# Patient Record
Sex: Male | Born: 1950
Health system: Southern US, Community
[De-identification: ages and names within clinical notes are randomized; demographics above are authoritative.]

## PROBLEM LIST (undated history)

## (undated) DIAGNOSIS — S22000A Wedge compression fracture of unspecified thoracic vertebra, initial encounter for closed fracture: Secondary | ICD-10-CM

## (undated) DIAGNOSIS — J449 Chronic obstructive pulmonary disease, unspecified: Secondary | ICD-10-CM

## (undated) DIAGNOSIS — B1920 Unspecified viral hepatitis C without hepatic coma: Secondary | ICD-10-CM

## (undated) DIAGNOSIS — C3490 Malignant neoplasm of unspecified part of unspecified bronchus or lung: Secondary | ICD-10-CM

## (undated) HISTORY — PX: LUNG SURGERY: SHX703

## (undated) HISTORY — PX: KNEE SURGERY: SHX244

## (undated) HISTORY — PX: APPENDECTOMY: SHX54

## (undated) HISTORY — PX: ABDOMINAL SURGERY: SHX537

---

## 2004-01-30 ENCOUNTER — Emergency Department (HOSPITAL_COMMUNITY): Admission: EM | Admit: 2004-01-30 | Discharge: 2004-01-30 | Payer: Self-pay | Admitting: Emergency Medicine

## 2005-10-06 ENCOUNTER — Inpatient Hospital Stay (HOSPITAL_COMMUNITY): Admission: EM | Admit: 2005-10-06 | Discharge: 2005-10-09 | Payer: Self-pay | Admitting: Emergency Medicine

## 2014-08-07 ENCOUNTER — Encounter (HOSPITAL_COMMUNITY): Payer: Self-pay | Admitting: Emergency Medicine

## 2014-08-07 ENCOUNTER — Inpatient Hospital Stay (HOSPITAL_COMMUNITY)
Admission: AD | Admit: 2014-08-07 | Discharge: 2014-08-09 | DRG: 881 | Disposition: A | Discharge status: Other Acute Inpatient Hospital | Payer: Non-veteran care | Source: Intra-hospital | Attending: Internal Medicine | Admitting: Internal Medicine

## 2014-08-07 ENCOUNTER — Encounter (HOSPITAL_COMMUNITY): Payer: Self-pay | Admitting: *Deleted

## 2014-08-07 ENCOUNTER — Emergency Department (EMERGENCY_DEPARTMENT_HOSPITAL)
Admission: EM | Admit: 2014-08-07 | Discharge: 2014-08-07 | Disposition: A | Discharge status: Other Acute Inpatient Hospital | Payer: Non-veteran care | Source: Home / Self Care | Attending: Emergency Medicine | Admitting: Emergency Medicine

## 2014-08-07 DIAGNOSIS — Z5900 Homelessness unspecified: Secondary | ICD-10-CM

## 2014-08-07 DIAGNOSIS — Z79899 Other long term (current) drug therapy: Secondary | ICD-10-CM | POA: Diagnosis not present

## 2014-08-07 DIAGNOSIS — J449 Chronic obstructive pulmonary disease, unspecified: Secondary | ICD-10-CM | POA: Diagnosis present

## 2014-08-07 DIAGNOSIS — F1124 Opioid dependence with opioid-induced mood disorder: Secondary | ICD-10-CM

## 2014-08-07 DIAGNOSIS — R109 Unspecified abdominal pain: Secondary | ICD-10-CM

## 2014-08-07 DIAGNOSIS — B1920 Unspecified viral hepatitis C without hepatic coma: Secondary | ICD-10-CM | POA: Diagnosis present

## 2014-08-07 DIAGNOSIS — R030 Elevated blood-pressure reading, without diagnosis of hypertension: Secondary | ICD-10-CM

## 2014-08-07 DIAGNOSIS — K760 Fatty (change of) liver, not elsewhere classified: Secondary | ICD-10-CM | POA: Diagnosis present

## 2014-08-07 DIAGNOSIS — Z720 Tobacco use: Secondary | ICD-10-CM | POA: Diagnosis present

## 2014-08-07 DIAGNOSIS — F1410 Cocaine abuse, uncomplicated: Secondary | ICD-10-CM | POA: Insufficient documentation

## 2014-08-07 DIAGNOSIS — F1994 Other psychoactive substance use, unspecified with psychoactive substance-induced mood disorder: Secondary | ICD-10-CM | POA: Diagnosis present

## 2014-08-07 DIAGNOSIS — IMO0001 Reserved for inherently not codable concepts without codable children: Secondary | ICD-10-CM | POA: Diagnosis present

## 2014-08-07 DIAGNOSIS — F32A Depression, unspecified: Secondary | ICD-10-CM | POA: Diagnosis present

## 2014-08-07 DIAGNOSIS — Z8709 Personal history of other diseases of the respiratory system: Secondary | ICD-10-CM

## 2014-08-07 DIAGNOSIS — F39 Unspecified mood [affective] disorder: Secondary | ICD-10-CM | POA: Diagnosis not present

## 2014-08-07 DIAGNOSIS — F329 Major depressive disorder, single episode, unspecified: Secondary | ICD-10-CM | POA: Insufficient documentation

## 2014-08-07 DIAGNOSIS — K5900 Constipation, unspecified: Secondary | ICD-10-CM | POA: Diagnosis present

## 2014-08-07 DIAGNOSIS — Z85118 Personal history of other malignant neoplasm of bronchus and lung: Secondary | ICD-10-CM

## 2014-08-07 DIAGNOSIS — Z609 Problem related to social environment, unspecified: Secondary | ICD-10-CM | POA: Diagnosis present

## 2014-08-07 DIAGNOSIS — Z853 Personal history of malignant neoplasm of breast: Secondary | ICD-10-CM

## 2014-08-07 DIAGNOSIS — Z23 Encounter for immunization: Secondary | ICD-10-CM

## 2014-08-07 DIAGNOSIS — R112 Nausea with vomiting, unspecified: Secondary | ICD-10-CM | POA: Diagnosis present

## 2014-08-07 DIAGNOSIS — G4700 Insomnia, unspecified: Secondary | ICD-10-CM | POA: Diagnosis present

## 2014-08-07 DIAGNOSIS — T402X5A Adverse effect of other opioids, initial encounter: Secondary | ICD-10-CM | POA: Diagnosis present

## 2014-08-07 DIAGNOSIS — R45851 Suicidal ideations: Secondary | ICD-10-CM

## 2014-08-07 DIAGNOSIS — Z8619 Personal history of other infectious and parasitic diseases: Secondary | ICD-10-CM

## 2014-08-07 DIAGNOSIS — K5903 Drug induced constipation: Secondary | ICD-10-CM | POA: Diagnosis present

## 2014-08-07 DIAGNOSIS — F1123 Opioid dependence with withdrawal: Secondary | ICD-10-CM | POA: Diagnosis present

## 2014-08-07 DIAGNOSIS — F10239 Alcohol dependence with withdrawal, unspecified: Secondary | ICD-10-CM | POA: Diagnosis not present

## 2014-08-07 DIAGNOSIS — F17210 Nicotine dependence, cigarettes, uncomplicated: Secondary | ICD-10-CM | POA: Diagnosis present

## 2014-08-07 DIAGNOSIS — F1021 Alcohol dependence, in remission: Secondary | ICD-10-CM | POA: Diagnosis present

## 2014-08-07 DIAGNOSIS — F1110 Opioid abuse, uncomplicated: Secondary | ICD-10-CM | POA: Diagnosis present

## 2014-08-07 DIAGNOSIS — D72829 Elevated white blood cell count, unspecified: Secondary | ICD-10-CM | POA: Diagnosis present

## 2014-08-07 DIAGNOSIS — F1193 Opioid use, unspecified with withdrawal: Secondary | ICD-10-CM | POA: Diagnosis present

## 2014-08-07 HISTORY — DX: Malignant neoplasm of unspecified part of unspecified bronchus or lung: C34.90

## 2014-08-07 HISTORY — DX: Chronic obstructive pulmonary disease, unspecified: J44.9

## 2014-08-07 HISTORY — DX: Unspecified viral hepatitis C without hepatic coma: B19.20

## 2014-08-07 LAB — COMPREHENSIVE METABOLIC PANEL
ALBUMIN: 3.6 g/dL (ref 3.5–5.2)
ALT: 20 U/L (ref 0–53)
ANION GAP: 9 (ref 5–15)
AST: 19 U/L (ref 0–37)
Alkaline Phosphatase: 84 U/L (ref 39–117)
BILIRUBIN TOTAL: 0.5 mg/dL (ref 0.3–1.2)
BUN: 9 mg/dL (ref 6–23)
CO2: 25 mEq/L (ref 19–32)
CREATININE: 0.72 mg/dL (ref 0.50–1.35)
Calcium: 8.8 mg/dL (ref 8.4–10.5)
Chloride: 101 mEq/L (ref 96–112)
GFR calc non Af Amer: >90 mL/min (ref >90)
GLUCOSE: 104 mg/dL — AB (ref 70–99)
Potassium: 4.2 mEq/L (ref 3.7–5.3)
Sodium: 135 mEq/L — ABNORMAL LOW (ref 137–147)
TOTAL PROTEIN: 6.6 g/dL (ref 6.0–8.3)

## 2014-08-07 LAB — CBC
HEMATOCRIT: 40.2 % (ref 39.0–52.0)
HEMOGLOBIN: 13.3 g/dL (ref 13.0–17.0)
MCH: 29.4 pg (ref 26.0–34.0)
MCHC: 33.1 g/dL (ref 30.0–36.0)
MCV: 88.9 fL (ref 78.0–100.0)
Platelets: 208 10*3/uL (ref 150–400)
RBC: 4.52 MIL/uL (ref 4.22–5.81)
RDW: 12.9 % (ref 11.5–15.5)
WBC: 6.2 10*3/uL (ref 4.0–10.5)

## 2014-08-07 LAB — RAPID URINE DRUG SCREEN, HOSP PERFORMED
Amphetamines: NOT DETECTED
BARBITURATES: NOT DETECTED
Benzodiazepines: NOT DETECTED
COCAINE: POSITIVE — AB
OPIATES: POSITIVE — AB
TETRAHYDROCANNABINOL: NOT DETECTED

## 2014-08-07 LAB — ACETAMINOPHEN LEVEL: Acetaminophen (Tylenol), Serum: <15 ug/mL (ref 10–30)

## 2014-08-07 LAB — SALICYLATE LEVEL: Salicylate Lvl: <2 mg/dL — ABNORMAL LOW (ref 2.8–20.0)

## 2014-08-07 LAB — ETHANOL

## 2014-08-07 MED ORDER — CLONIDINE HCL 0.1 MG PO TABS: 0.1000 mg | ORAL_TABLET | ORAL | Status: DC

## 2014-08-07 MED ORDER — PNEUMOCOCCAL VAC POLYVALENT 25 MCG/0.5ML IJ INJ: 0.5000 mL | INJECTION | INTRAMUSCULAR | Status: DC

## 2014-08-07 MED ORDER — INFLUENZA VAC SPLIT QUAD 0.5 ML IM SUSY
0.5000 mL | PREFILLED_SYRINGE | INTRAMUSCULAR | Status: DC
  Filled 2014-08-07: qty 0.5

## 2014-08-07 MED ORDER — HYDROXYZINE HCL 25 MG PO TABS: 25.0000 mg | ORAL_TABLET | Freq: Four times a day (QID) | ORAL | Status: DC | PRN

## 2014-08-07 MED ORDER — CLONIDINE HCL 0.1 MG PO TABS
0.1000 mg | ORAL_TABLET | Freq: Four times a day (QID) | ORAL | Status: AC
  Administered 2014-08-07 – 2014-08-08 (×5): 0.1 mg via ORAL
  Filled 2014-08-07 (×13): qty 1

## 2014-08-07 MED ORDER — NAPROXEN 500 MG PO TABS: 500.0000 mg | ORAL_TABLET | Freq: Two times a day (BID) | ORAL | Status: DC | PRN

## 2014-08-07 MED ORDER — ACETAMINOPHEN 325 MG PO TABS: 650.0000 mg | ORAL_TABLET | ORAL | Status: DC | PRN

## 2014-08-07 MED ORDER — CLONIDINE HCL 0.1 MG PO TABS
0.1000 mg | ORAL_TABLET | Freq: Four times a day (QID) | ORAL | Status: DC
  Administered 2014-08-07: 0.1 mg via ORAL
  Filled 2014-08-07: qty 1

## 2014-08-07 MED ORDER — CLONIDINE HCL 0.1 MG PO TABS: 0.1000 mg | ORAL_TABLET | Freq: Every day | ORAL | Status: DC

## 2014-08-07 MED ORDER — LOPERAMIDE HCL 2 MG PO CAPS: 2.0000 mg | ORAL_CAPSULE | ORAL | Status: DC | PRN

## 2014-08-07 MED ORDER — METHOCARBAMOL 500 MG PO TABS: 500.0000 mg | ORAL_TABLET | Freq: Three times a day (TID) | ORAL | Status: DC | PRN

## 2014-08-07 MED ORDER — ACETAMINOPHEN 325 MG PO TABS: 650.0000 mg | ORAL_TABLET | Freq: Four times a day (QID) | ORAL | Status: DC | PRN

## 2014-08-07 MED ORDER — HYDROXYZINE HCL 25 MG PO TABS
25.0000 mg | ORAL_TABLET | Freq: Four times a day (QID) | ORAL | Status: DC | PRN
  Filled 2014-08-07: qty 1

## 2014-08-07 MED ORDER — ONDANSETRON 4 MG PO TBDP: 4.0000 mg | ORAL_TABLET | Freq: Four times a day (QID) | ORAL | Status: DC | PRN

## 2014-08-07 MED ORDER — CLONIDINE HCL 0.1 MG PO TABS
0.1000 mg | ORAL_TABLET | ORAL | Status: DC
Start: 2014-08-09 — End: 2014-08-09
  Filled 2014-08-07 (×4): qty 1

## 2014-08-07 MED ORDER — DICYCLOMINE HCL 20 MG PO TABS: 20.0000 mg | ORAL_TABLET | Freq: Four times a day (QID) | ORAL | Status: DC | PRN

## 2014-08-07 MED ORDER — ONDANSETRON 4 MG PO TBDP
4.0000 mg | ORAL_TABLET | Freq: Four times a day (QID) | ORAL | Status: DC | PRN
  Administered 2014-08-08 (×2): 4 mg via ORAL
  Filled 2014-08-07 (×4): qty 1

## 2014-08-07 MED ORDER — ALUM & MAG HYDROXIDE-SIMETH 200-200-20 MG/5ML PO SUSP: 30.0000 mL | ORAL | Status: DC | PRN

## 2014-08-07 MED ORDER — MAGNESIUM HYDROXIDE 400 MG/5ML PO SUSP: 30.0000 mL | Freq: Every day | ORAL | Status: DC | PRN

## 2014-08-07 MED ORDER — DICYCLOMINE HCL 20 MG PO TABS
20.0000 mg | ORAL_TABLET | Freq: Four times a day (QID) | ORAL | Status: DC | PRN
  Filled 2014-08-07: qty 1

## 2014-08-07 NOTE — Progress Notes (Signed)
ADMISSION NOTE D: Patient presents to [facility] with SI with plan to "walk in front of a car." Pt reports current heroin use. Pt denies HI and AVH. Pt complains of chills, anxiety, and headache pain 4/10 at this time. Pts mood is depressed and affect is flat. Pt states his current stressors are homelessness, upcoming court date, and financial struggles. Pt reports his daughter is a good support person in his life. Pt is observed to be drowsy during admission.  A: Pt offered fluids. Pt oriented to unit and staff. Support provided to pt. Pt encouraged to rest/relax. 15 minute checks initiated per protocol for patient safety. R: Pt cooperative and receptive to admission and to nursing interventions. Pt verbally contracts for safety with RN.

## 2014-08-07 NOTE — ED Provider Notes (Signed)
CSN: [id number]     Arrival date & time 08/07/14  1046 History   First MD Initiated Contact with Patient 08/07/14 1122     Chief Complaint  Patient presents with  . detox    . SI      (Consider location/radiation/quality/duration/timing/severity/associated sxs/prior Treatment) HPI Comments: 63 year old male with history of alcohol abuse, heroin abuse, smoker, homeless presents interested in detox and evaluation for suicidal ideation. Patient has been using heroin for some time now last use last night. Patient has had gradually worsening depression in the past week he has had thoughts of walking into traffic to end his life. He denies admission for heroin detox, patient has been alcohol detox in the past but does not currently is on call. Patient has a history of hep C COPD and lung cancer. Patient has no family or support in the area. Nothing improves his symptoms  The history is provided by the patient.    Past Medical History  Diagnosis Date  . Lung cancer     resolved in 2011  . Hepatitis C   . COPD (chronic obstructive pulmonary disease)    Past Surgical History  Procedure Laterality Date  . Abdominal surgery      2008  . Lung surgery      2011  . Appendectomy    . Knee surgery      right knee, 1967   History reviewed. No pertinent family history. History  Substance Use Topics  . Smoking status: Current Every Day Smoker -- 1.00 packs/day for 15 years    Types: Cigarettes  . Smokeless tobacco: Not on file  . Alcohol Use: No    Review of Systems  Constitutional: Negative for fever and chills.  HENT: Negative for congestion.   Eyes: Negative for visual disturbance.  Respiratory: Negative for shortness of breath.   Cardiovascular: Negative for chest pain.  Gastrointestinal: Negative for vomiting and abdominal pain.  Genitourinary: Negative for dysuria and flank pain.  Musculoskeletal: Negative for back pain, neck pain and neck stiffness.  Skin: Negative for rash.   Neurological: Negative for light-headedness and headaches.  Psychiatric/Behavioral: Positive for suicidal ideas and dysphoric mood.      Allergies  Review of patient's allergies indicates no known allergies.  Home Medications   Prior to Admission medications   Not on File   BP 112/73  Pulse 76  Temp(Src) 98.6 F (37 C) (Oral)  Resp 16  SpO2 99% Physical Exam  Nursing note and vitals reviewed. Constitutional: He is oriented to person, place, and time. He appears well-developed and well-nourished.  HENT:  Head: Normocephalic and atraumatic.  Eyes: Conjunctivae are normal. Right eye exhibits no discharge. Left eye exhibits no discharge.  Neck: Normal range of motion. Neck supple. No tracheal deviation present.  Cardiovascular: Normal rate and regular rhythm.   Pulmonary/Chest: Effort normal and breath sounds normal.  Abdominal: Soft. He exhibits no distension. There is no tenderness. There is no guarding.  Musculoskeletal: He exhibits no edema.  Neurological: He is alert and oriented to person, place, and time.  Skin: Skin is warm. No rash noted.  Psychiatric: His mood appears not anxious. His speech is not rapid and/or pressured. He expresses suicidal ideation. He expresses no homicidal plans.  Mild flat affect.    ED Course  Procedures (including critical care time) Labs Review Labs Reviewed  COMPREHENSIVE METABOLIC PANEL - Abnormal; Notable for the following:    Sodium 135 (*)    Glucose, Bld 104 (*)  All other components within normal limits  SALICYLATE LEVEL - Abnormal; Notable for the following:    Salicylate Lvl <2.9 (*)    All other components within normal limits  URINE RAPID DRUG SCREEN (HOSP PERFORMED) - Abnormal; Notable for the following:    Opiates POSITIVE (*)    Cocaine POSITIVE (*)    All other components within normal limits  ACETAMINOPHEN LEVEL  CBC  ETHANOL    Imaging Review No results found.   EKG Interpretation None      MDM    Final diagnoses:  Heroin abuse  Suicidal ideation  Homeless   Patient with worsening depression, heroin abuse and recent suicidal ideation. Based on my exam is medically clear, blood work pending. Plan for behavior health assessment for further recommendations for treatment options.  Patient medically clear my exam, psychiatric orders and consult pending.  Filed Vitals:   08/07/14 1051 08/07/14 1430 08/07/14 1431  BP: 149/73 112/73 112/73  Pulse: 63 76   Temp: 98 F (36.7 C) 98.6 F (37 C)   TempSrc: Oral    Resp: 20 16   SpO2: 100% 99%       Mariea Clonts, MD 08/07/14 [phone]

## 2014-08-07 NOTE — Tx Team (Signed)
Initial Interdisciplinary Treatment Plan   PATIENT STRESSORS: Financial difficulties Legal issue Substance abuse   PROBLEM LIST: Problem List/Patient Goals Date to be addressed Date deferred Reason deferred Estimated date of resolution  Suicidal ideation 08/07/14     Substance abuse 08/07/14                                                DISCHARGE CRITERIA:  Ability to meet basic life and health needs Adequate post-discharge living arrangements Improved stabilization in mood, thinking, and/or behavior Reduction of life-threatening or endangering symptoms to within safe limits Withdrawal symptoms are absent or subacute and managed without 24-hour nursing intervention  PRELIMINARY DISCHARGE PLAN: Attend aftercare/continuing care group Attend 12-step recovery group Placement in alternative living arrangements  PATIENT/FAMIILY INVOLVEMENT: This treatment plan has been presented to and reviewed with the patient, Antwuan Eckley.  The patient and family have been given the opportunity to ask questions and make suggestions.  Charlyne Quale A 08/07/2014, 4:18 PM

## 2014-08-07 NOTE — Progress Notes (Signed)
Patient ID: Gregory Price, male   DOB: June 26, 1951, 63 y.o.   MRN: [id number] Patient has been asleep since his admission.  He did take his scheduled clonidine.

## 2014-08-07 NOTE — ED Notes (Signed)
[facility] called and arrived to transport pt.

## 2014-08-07 NOTE — ED Notes (Addendum)
Pt reports he is homeless wants heroine detox.last use last night, pt reports he shoots up. Reports he quit drinking 12 years ago. Has used heroine for past 5-6 years. Reports he is depressed, never had SI attempt in past. Denies HI, AH/VH. Denies pain. Hx Hep C, COPD, and lung cancer.   Pt later stated to rn and MD that he was SI and wanted to run into traffic. This stated in other documentation.

## 2014-08-07 NOTE — Consult Note (Signed)
[facility] Face-to-Face Psychiatry Consult   Reason for Consult:  Suicidal ideation Referring Physician:  EDP  Benecio Kluger is an 63 y.o. male. Total Time spent with patient: 45 minutes  Assessment: AXIS I:  Depressive Disorder NOS, Substance Abuse and Substance Induced Mood Disorder AXIS II:  Deferred AXIS III:   Past Medical History  Diagnosis Date  . Lung cancer     resolved in 2011  . Hepatitis C   . COPD (chronic obstructive pulmonary disease)    AXIS IV:  other psychosocial or environmental problems and problems related to social environment AXIS V:  41-50 serious symptoms  Plan:  Patient does not meet criteria for psychiatric inpatient admission.  Subjective:    HPI:  Jairon Ripberger is a 63 y.o. male patient present to [facility] ED with complaints of suicidal ideation and plan to jump into traffic.  Patient is also requesting heroin detox.  Patient states that he uses 1 gm of heroin a day.  "I started out with pain medication and when couldn't afford it of get it any more I started the heroin about 5 years ago.  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  Patient states that he has been in detox once 2 years ago and was clean for 2 months.  Patient is homeless.  Patient states that he needs to get his life together and get off of the heroin.   HPI Elements:   Location:  Depression. Quality:  Heroin abuse. Severity:  Suicidal ideation. Timing:  1 day. Review of Systems  Psychiatric/Behavioral: Positive for depression, suicidal ideas and substance abuse. Negative for hallucinations and memory loss. The patient is not nervous/anxious and does not have insomnia.   All other systems reviewed and are negative.  History reviewed. No pertinent family history.  Past Psychiatric History: Past Medical History  Diagnosis Date  . Lung cancer     resolved in 2011  . Hepatitis C   . COPD (chronic obstructive pulmonary disease)     reports that he has been smoking Cigarettes.  He has a 15 pack-year  smoking history. He does not have any smokeless tobacco history on file. He reports that he uses illicit drugs. He reports that he does not drink alcohol. History reviewed. No pertinent family history.         Allergies:  No Known Allergies  ACT Assessment Complete:  Yes:    Educational Status    Risk to Self: Risk to self with the past 6 months Is patient at risk for suicide?: Yes Substance abuse history and/or treatment for substance abuse?: Yes (see triage note. pt reports he is depressed,SI, plant to walk into traffic. homeless, requesting heroine detox, last use last night. denies ETOH use. denies HI, AH/VH)  Risk to Others:    Abuse:    Prior Inpatient Therapy:    Prior Outpatient Therapy:    Additional Information:                    Objective: Blood pressure 149/73, pulse 63, temperature 98 F (36.7 C), temperature source Oral, resp. rate 20, SpO2 100.00%.There is no height or weight on file to calculate BMI. Results for orders placed during the hospital encounter of 08/07/14 (from the past 72 hour(s))  ACETAMINOPHEN LEVEL     Status: None   Collection Time    08/07/14 11:17 AM      Result Value Ref Range   Acetaminophen (Tylenol), Serum <15.0  10 - 30 ug/mL  Comment:            THERAPEUTIC CONCENTRATIONS VARY     SIGNIFICANTLY. A RANGE OF 10-30     ug/mL MAY BE AN EFFECTIVE     CONCENTRATION FOR MANY PATIENTS.     HOWEVER, SOME ARE BEST TREATED     AT CONCENTRATIONS OUTSIDE THIS     RANGE.     ACETAMINOPHEN CONCENTRATIONS     >150 ug/mL AT 4 HOURS AFTER     INGESTION AND >50 ug/mL AT 12     HOURS AFTER INGESTION ARE     OFTEN ASSOCIATED WITH TOXIC     REACTIONS.  CBC     Status: None   Collection Time    08/07/14 11:17 AM      Result Value Ref Range   WBC 6.2  4.0 - 10.5 K/uL   RBC 4.52  4.22 - 5.81 MIL/uL   Hemoglobin 13.3  13.0 - 17.0 g/dL   HCT 40.2  39.0 - 52.0 %   MCV 88.9  78.0 - 100.0 fL   MCH 29.4  26.0 - 34.0 pg   MCHC 33.1  30.0 -  36.0 g/dL   RDW 12.9  11.5 - 15.5 %   Platelets 208  150 - 400 K/uL  COMPREHENSIVE METABOLIC PANEL     Status: Abnormal   Collection Time    08/07/14 11:17 AM      Result Value Ref Range   Sodium 135 (*) 137 - 147 mEq/L   Potassium 4.2  3.7 - 5.3 mEq/L   Chloride 101  96 - 112 mEq/L   CO2 25  19 - 32 mEq/L   Glucose, Bld 104 (*) 70 - 99 mg/dL   BUN 9  6 - 23 mg/dL   Creatinine, Ser 0.72  0.50 - 1.35 mg/dL   Calcium 8.8  8.4 - 10.5 mg/dL   Total Protein 6.6  6.0 - 8.3 g/dL   Albumin 3.6  3.5 - 5.2 g/dL   AST 19  0 - 37 U/L   ALT 20  0 - 53 U/L   Alkaline Phosphatase 84  39 - 117 U/L   Total Bilirubin 0.5  0.3 - 1.2 mg/dL   GFR calc non Af Amer >90  >90 mL/min   GFR calc Af Amer >90  >90 mL/min   Comment: (NOTE)     The eGFR has been calculated using the CKD EPI equation.     This calculation has not been validated in all clinical situations.     eGFR's persistently <90 mL/min signify possible Chronic Kidney     Disease.   Anion gap 9  5 - 15  ETHANOL     Status: None   Collection Time    08/07/14 11:17 AM      Result Value Ref Range   Alcohol, Ethyl (B) <11  0 - 11 mg/dL   Comment:            LOWEST DETECTABLE LIMIT FOR     SERUM ALCOHOL IS 11 mg/dL     FOR MEDICAL PURPOSES ONLY  SALICYLATE LEVEL     Status: Abnormal   Collection Time    08/07/14 11:17 AM      Result Value Ref Range   Salicylate Lvl <9.5 (*) 2.8 - 20.0 mg/dL  URINE RAPID DRUG SCREEN (HOSP PERFORMED)     Status: Abnormal   Collection Time    08/07/14 11:30 AM      Result Value Ref  Range   Opiates POSITIVE (*) NONE DETECTED   Cocaine POSITIVE (*) NONE DETECTED   Benzodiazepines NONE DETECTED  NONE DETECTED   Amphetamines NONE DETECTED  NONE DETECTED   Tetrahydrocannabinol NONE DETECTED  NONE DETECTED   Barbiturates NONE DETECTED  NONE DETECTED   Comment:            DRUG SCREEN FOR MEDICAL PURPOSES     ONLY.  IF CONFIRMATION IS NEEDED     FOR ANY PURPOSE, NOTIFY LAB     WITHIN 5 DAYS.                 LOWEST DETECTABLE LIMITS     FOR URINE DRUG SCREEN     Drug Class       Cutoff (ng/mL)     Amphetamine      1000     Barbiturate      200     Benzodiazepine   016     Tricyclics       010     Opiates          300     Cocaine          300     THC              50   Labs are reviewed see abnormal values above.  Medications reviewed and no changes made.  Will start Clonidine Protocol for opiate withdrawal  Current Facility-Administered Medications  Medication Dose Route Frequency Provider Last Rate Last Dose  . acetaminophen (TYLENOL) tablet 650 mg  650 mg Oral Q4H PRN Mariea Clonts, MD      . dicyclomine (BENTYL) tablet 20 mg  20 mg Oral Q6H PRN Mariea Clonts, MD      . hydrOXYzine (ATARAX/VISTARIL) tablet 25 mg  25 mg Oral Q6H PRN Mariea Clonts, MD      . loperamide (IMODIUM) capsule 2-4 mg  2-4 mg Oral PRN Mariea Clonts, MD      . methocarbamol (ROBAXIN) tablet 500 mg  500 mg Oral Q8H PRN Mariea Clonts, MD      . naproxen (NAPROSYN) tablet 500 mg  500 mg Oral BID PRN Mariea Clonts, MD      . ondansetron (ZOFRAN-ODT) disintegrating tablet 4 mg  4 mg Oral Q6H PRN Mariea Clonts, MD       No current outpatient prescriptions on file.    Psychiatric Specialty Exam:     Blood pressure 149/73, pulse 63, temperature 98 F (36.7 C), temperature source Oral, resp. rate 20, SpO2 100.00%.There is no height or weight on file to calculate BMI.  General Appearance: Casual  Eye Contact::  Good  Speech:  Clear and Coherent and Normal Rate  Volume:  Normal  Mood:  Depressed  Affect:  Congruent and Depressed  Thought Process:  Circumstantial and Goal Directed  Orientation:  Full (Time, Place, and Person)  Thought Content:  "I want to get my life together"  Suicidal Thoughts:  Yes.  with intent/plan  Homicidal Thoughts:  No  Memory:  Immediate;   Good Recent;   Good Remote;   Fair  Judgement:  Fair  Insight:  Lacking  Psychomotor Activity:  Normal  Concentration:  Fair   Recall:  Good  Fund of Knowledge:Good  Language: Good  Akathisia:  No  Handed:  Right  AIMS (if indicated):     Assets:  Communication Skills Desire for Improvement  Sleep:  Musculoskeletal: Strength & Muscle Tone: within normal limits Gait & Station: normal Patient leans: N/A  Treatment Plan Summary: Daily contact with patient to assess and evaluate symptoms and progress in treatment Medication management Inpatient treatment recommended  Yoshimi Sarr, FNP-BC 08/07/2014 1:51 PM

## 2014-08-07 NOTE — BH Assessment (Signed)
Patient accepted to [facility] by Dr. Lovena Le and Earleen Newport, NP. The room assignment is 508-2. Nursing report # is [phone]. Support paperwork completed.

## 2014-08-07 NOTE — Consult Note (Signed)
Face to face evaluation and I agree with this note 

## 2014-08-07 NOTE — Progress Notes (Signed)
Patient ID: Gregory Price, male   DOB: [birth year], 63 y.o.   MRN: [id number] D: Client in bed, reports "rough", when asked about detox. A: Writer introduced self to client, provided emotional support, encouraged Gatorade, reviewed medication, administered as ordered. Staff will monitor q20min for safety. R: Client is safe on the unit, did not attend group.

## 2014-08-07 NOTE — Progress Notes (Signed)
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 last use last night, pt reports he shoots up. Reports     Subjective/Objective Assessment Detail:   he shoots up. Reports he quit drinking 12 years ago. Has used heroine for past 5-6 years. Reports he is depressed, never had SI attempt in past. Denies HI, AH/VH. Denies pain. Hx Hep C, COPD, and lung cancer.        [place] veteran[facility] administration is his pcp seen by an infectious disease & oncologist there also per pt     Action/Plan:   pt without pcp CM spoke with him Updated [facility]IC with [facility]. Cm discussed [facility] ED medical clearance and BH evaluation processes Cm left pt being wonded by ED security, preparing for transfer to [facility] ED   Action/Plan Detail:   Anticipated DC Date:  08/07/2014     Status Recommendation to Physician:   Result of Recommendation:    Other ED [place]  Other  Outpatient Services - Pt will follow up  PCP issues    Choice offered to / List presented to:            Status of service:  Completed, signed off  ED Comments:   ED Comments Detail:

## 2014-08-07 NOTE — Progress Notes (Signed)
Pt did not attend group. Pt was lying in bed asleep.

## 2014-08-07 NOTE — ED Notes (Signed)
Bed: WA04 Expected date:  Expected time:  Means of arrival:  Comments: No bed no monitor

## 2014-08-08 DIAGNOSIS — F1021 Alcohol dependence, in remission: Secondary | ICD-10-CM

## 2014-08-08 DIAGNOSIS — F329 Major depressive disorder, single episode, unspecified: Principal | ICD-10-CM

## 2014-08-08 DIAGNOSIS — F1194 Opioid use, unspecified with opioid-induced mood disorder: Secondary | ICD-10-CM

## 2014-08-08 MED ORDER — ENSURE COMPLETE PO LIQD
237.0000 mL | Freq: Two times a day (BID) | ORAL | Status: DC
  Filled 2014-08-08: qty 237

## 2014-08-08 NOTE — BHH Group Notes (Signed)
Adult Psychoeducational Group Note  Date:  08/08/2014 Time:  10:17 PM  Group Topic/Focus:  Wrap-Up Group:   The focus of this group is to help patients review their daily goal of treatment and discuss progress on daily workbooks.  Participation Level:  Did Not Attend  Participation Quality:  None  Affect:  None  Cognitive:  None  Insight: None  Engagement in Group:  None  Modes of Intervention:  Discussion  Additional Comments:  Gregory Price did not attend group.  [name] A 08/08/2014, 10:17 PM

## 2014-08-08 NOTE — BHH Group Notes (Signed)
[place] LCSW Group Therapy  08/08/2014  1:05 PM  Type of Therapy:  Group therapy  Participation Level:  Did not attend.  Withdrawing.  Summary of Progress/Problems:  Chaplain was here to lead a group on themes of hope and courage.  Roque Lias B 08/08/2014 1:27 PM

## 2014-08-08 NOTE — Progress Notes (Signed)
Patient ID: Gregory Price, male   DOB: Feb 11, 1951, 63 y.o.   MRN: [id number] D: Client in bed most of the shift, up for medications and to watch TV for a short period of time. Client c/o nausea, gave Zofran 4 mg po. Client noted he was vomiting earlier today. A: Writer provided emotional support, encouraged fluids. Staff will monitor q29min for safety. R: Client is safe on the unit, did not attend group.

## 2014-08-08 NOTE — Tx Team (Signed)
Interdisciplinary Treatment Plan Update (Adult)   Date: 08/08/2014   Time Reviewed: 11:04 AM  Progress in Treatment:  Attending groups: No.  Participating in groups:  No.  Taking medication as prescribed: Yes  Tolerating medication: Yes  Family/Significant othe contact made: Not yet. SPE required for this pt.   Patient understands diagnosis: Yes, AEB seeking treatment for SI, mood stabilization, heroin detox, and medication stabilization.  Discussing patient identified problems/goals with staff: Yes  Medical problems stabilized or resolved: Yes  Denies suicidal/homicidal ideation: Passive SI reported. Pt able to contract for safety on unit.  Patient has not harmed self or Others: Yes  New problem(s) identified:  Discharge Plan or Barriers: Pt not attending groups or getting out of bed at this time. CSW assessing for appropriate referrals and will attempt to meet with pt this afternoon to complete assessment and discuss aftercare plan.  Additional comments: Patient presents to [facility] with SI with plan to "walk in front of a car." Pt reports current heroin use. Pt denies HI and AVH. Pt complains of chills, anxiety, and headache pain 4/10 at this time. Pts mood is depressed and affect is flat. Pt states his current stressors are homelessness, upcoming court date, and financial struggles. Pt reports his daughter is a good support person in his life. Pt is observed to be drowsy during admission.  Pt cooperative and receptive to admission and to nursing interventions. Pt verbally contracts for safety with RN Reason for Continuation of Hospitalization: Withdrawals-clonidine taper Mood stabilization/depression SI Medication management  Estimated length of stay: 5-7 days  For review of initial/current patient goals, please see plan of care.  Attendees:  Patient:    Family:    Physician: Dr. Shea Evans, MD 08/08/2014 11:03 AM   Nursing: Barnett Applebaum RN 08/08/2014 11:03 AM   Clinical Social  Worker [place], [place]  08/08/2014 11:03 AM   Other: Edwyna Shell, LCSW 08/08/2014 11:03 AM   Other: Roque Lias, LCSW 08/08/2014 11:03 AM   Other: Norberto Sorenson, Community Care Coordinator  08/08/2014 11:03 AM   Other: Bonnye Fava, [place] Intern  08/08/2014 11:03 AM   Scribe for Treatment Team:  [place] LCSWA 08/08/2014 11:03 AM

## 2014-08-08 NOTE — Progress Notes (Signed)
Pt did not attend group this morning.

## 2014-08-08 NOTE — Progress Notes (Signed)
D: Patient appropriate and cooperative with staff. Declined self inventory sheet. Writer observed that the patient is having a tough time with detox today, in that he's having a lot of nausea, vomiting, dry heaving, tremors and sweats. He reported not being able to hold anything on his stomach, including ginger ale. Patient has been resting in bed all day. Adheres to the current medication regimen.  A: Support and encouragement provided to patient. Administered scheduled medications per ordering MD. Monitor Q15 minute checks for safety.  R: Patient receptive. Denies SI/HI and auditory/visual hallucinations. Patient remains safe on the unit.

## 2014-08-08 NOTE — BHH Suicide Risk Assessment (Signed)
   Nursing information obtained from:  Patient Demographic factors:  Male;Divorced or widowed;Caucasian;Low socioeconomic status;Living alone;Unemployed Current Mental Status:  Suicidal ideation indicated by patient;Suicide plan;Self-harm thoughts;Belief that plan would result in death Loss Factors:  Legal issues Historical Factors:  Prior suicide attempts Risk Reduction Factors:  Positive social support Total Time spent with patient: 45 minutes  CLINICAL FACTORS:  Opiate Dependence, Depression  Psychiatric Specialty Exam: Physical Exam  ROS  Blood pressure 139/78, pulse 61, temperature 98.9 F (37.2 C), temperature source Oral, resp. rate 17, height 5\' 3"  (1.6 m), weight 58.06 kg (128 lb).Body mass index is 22.68 kg/(m^2).  SEE ADMIT NOTE MSE   COGNITIVE FEATURES THAT CONTRIBUTE TO RISK:  Closed-mindedness    SUICIDE RISK:   Moderate:  Frequent suicidal ideation with limited intensity, and duration, some specificity in terms of plans, no associated intent, good self-control, limited dysphoria/symptomatology, some risk factors present, and identifiable protective factors, including available and accessible social support.  PLAN OF CARE: Admit patient to inpatient unit for the purpose of detoxification/ management of withdrawal. Provide support and encouragement regarding sobriety/abstinence efforts. Will follow daily.   I certify that inpatient services furnished can reasonably be expected to improve the patient's condition.  Jamelle Noy, [place] 08/08/2014, 1:59 PM
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
## 2014-08-08 NOTE — Progress Notes (Signed)
NUTRITION ASSESSMENT  Pt identified as at risk on the Malnutrition Screen Tool  Pt meets criteria for severe MALNUTRITION in the context of social/environmental as evidenced by 14% weight loss in the past 2 months and intake <50% for the past month.   INTERVENTION: 1. Educated patient on the importance of nutrition and encouraged intake of food and beverages. 2. Discussed weight goals. 3. Supplements: Ensure Complete po BID, each supplement provides 350 kcal and 13 grams of protein When patient able to keep food down.  NUTRITION DIAGNOSIS: Unintentional weight loss related to sub-optimal intake as evidenced by pt report.   Goal: Pt to meet >/= 90% of their estimated nutrition needs.  Monitor:  PO intake  Assessment:  Patient admitted with depression and SI.  Substance abuse and substance abuse mood disorder.  Heroine and positive for cocaine.  Patient currently unable to tolerate po secondary to N/V due to detox.  Homeless.  Decreased intake for the past 2 months secondary to homeless and drugs.  UBW 145-150 lbs about 2 months ago.  Weight loss of about 20 lbs (14%) during that time.  63 y.o. male  Height: Ht Readings from Last 1 Encounters:  08/07/14 5\' 3"  (1.6 m)    Weight: Wt Readings from Last 1 Encounters:  08/07/14 128 lb (58.06 kg)    Weight Hx: Wt Readings from Last 10 Encounters:  08/07/14 128 lb (58.06 kg)    BMI:  Body mass index is 22.68 kg/(m^2). Pt meets criteria for normal weight based on current BMI.  Estimated Nutritional Needs: Kcal: 25-30 kcal/kg Protein: > 1 gram protein/kg Fluid: 1 ml/kcal  Diet Order: General Pt is also offered choice of unit snacks mid-morning and mid-afternoon.  Pt is eating as desired.   Lab results and medications reviewed.   Antonieta Iba, RD, LDN Clinical Inpatient Dietitian Pager:  [phone] Weekend and after hours pager:  [phone]

## 2014-08-08 NOTE — BHH Group Notes (Signed)
[facility] LCSW Aftercare Discharge Planning Group Note   08/08/2014 10:42 AM  Participation Quality:  Invited-Did not attend. Pt sleeping in room   [place], [place]

## 2014-08-09 ENCOUNTER — Inpatient Hospital Stay (HOSPITAL_COMMUNITY): Payer: Non-veteran care

## 2014-08-09 ENCOUNTER — Observation Stay (HOSPITAL_COMMUNITY)
Admission: AD | Admit: 2014-08-09 | Discharge: 2014-08-11 | Disposition: A | Discharge status: Home / Self Care | Payer: Non-veteran care | Source: Ambulatory Visit | Attending: Internal Medicine | Admitting: Internal Medicine

## 2014-08-09 ENCOUNTER — Encounter (HOSPITAL_COMMUNITY): Payer: Self-pay

## 2014-08-09 DIAGNOSIS — N281 Cyst of kidney, acquired: Secondary | ICD-10-CM | POA: Insufficient documentation

## 2014-08-09 DIAGNOSIS — Z9049 Acquired absence of other specified parts of digestive tract: Secondary | ICD-10-CM | POA: Insufficient documentation

## 2014-08-09 DIAGNOSIS — F10239 Alcohol dependence with withdrawal, unspecified: Secondary | ICD-10-CM | POA: Insufficient documentation

## 2014-08-09 DIAGNOSIS — R030 Elevated blood-pressure reading, without diagnosis of hypertension: Secondary | ICD-10-CM

## 2014-08-09 DIAGNOSIS — F1123 Opioid dependence with withdrawal: Secondary | ICD-10-CM | POA: Diagnosis present

## 2014-08-09 DIAGNOSIS — K5900 Constipation, unspecified: Secondary | ICD-10-CM | POA: Insufficient documentation

## 2014-08-09 DIAGNOSIS — K5909 Other constipation: Secondary | ICD-10-CM

## 2014-08-09 DIAGNOSIS — R112 Nausea with vomiting, unspecified: Secondary | ICD-10-CM

## 2014-08-09 DIAGNOSIS — F39 Unspecified mood [affective] disorder: Secondary | ICD-10-CM | POA: Insufficient documentation

## 2014-08-09 DIAGNOSIS — Z8619 Personal history of other infectious and parasitic diseases: Secondary | ICD-10-CM | POA: Diagnosis present

## 2014-08-09 DIAGNOSIS — K5903 Drug induced constipation: Secondary | ICD-10-CM | POA: Diagnosis present

## 2014-08-09 DIAGNOSIS — Z5900 Homelessness unspecified: Secondary | ICD-10-CM

## 2014-08-09 DIAGNOSIS — F1193 Opioid use, unspecified with withdrawal: Secondary | ICD-10-CM | POA: Diagnosis present

## 2014-08-09 DIAGNOSIS — Z8709 Personal history of other diseases of the respiratory system: Secondary | ICD-10-CM

## 2014-08-09 DIAGNOSIS — J449 Chronic obstructive pulmonary disease, unspecified: Secondary | ICD-10-CM | POA: Insufficient documentation

## 2014-08-09 DIAGNOSIS — R45851 Suicidal ideations: Secondary | ICD-10-CM | POA: Insufficient documentation

## 2014-08-09 DIAGNOSIS — Z720 Tobacco use: Secondary | ICD-10-CM

## 2014-08-09 DIAGNOSIS — T402X5A Adverse effect of other opioids, initial encounter: Secondary | ICD-10-CM | POA: Diagnosis present

## 2014-08-09 DIAGNOSIS — Z79899 Other long term (current) drug therapy: Secondary | ICD-10-CM | POA: Insufficient documentation

## 2014-08-09 DIAGNOSIS — B1920 Unspecified viral hepatitis C without hepatic coma: Secondary | ICD-10-CM | POA: Insufficient documentation

## 2014-08-09 DIAGNOSIS — K760 Fatty (change of) liver, not elsewhere classified: Secondary | ICD-10-CM | POA: Diagnosis present

## 2014-08-09 DIAGNOSIS — D72829 Elevated white blood cell count, unspecified: Secondary | ICD-10-CM | POA: Diagnosis present

## 2014-08-09 DIAGNOSIS — Z85118 Personal history of other malignant neoplasm of bronchus and lung: Secondary | ICD-10-CM | POA: Insufficient documentation

## 2014-08-09 DIAGNOSIS — IMO0001 Reserved for inherently not codable concepts without codable children: Secondary | ICD-10-CM | POA: Diagnosis present

## 2014-08-09 LAB — URINALYSIS, ROUTINE W REFLEX MICROSCOPIC
Bilirubin Urine: NEGATIVE
Glucose, UA: NEGATIVE mg/dL
Hgb urine dipstick: NEGATIVE
Ketones, ur: NEGATIVE mg/dL
LEUKOCYTES UA: NEGATIVE
NITRITE: NEGATIVE
PH: 7.5 (ref 5.0–8.0)
Protein, ur: 30 mg/dL — AB
Specific Gravity, Urine: >1.046 — ABNORMAL HIGH (ref 1.005–1.030)
Urobilinogen, UA: 1 mg/dL (ref 0.0–1.0)

## 2014-08-09 LAB — CBC WITH DIFFERENTIAL/PLATELET
Basophils Absolute: 0 10*3/uL (ref 0.0–0.1)
Basophils Relative: 0 % (ref 0–1)
Eosinophils Absolute: 0 10*3/uL (ref 0.0–0.7)
Eosinophils Relative: 0 % (ref 0–5)
HCT: 48 % (ref 39.0–52.0)
Hemoglobin: 16.6 g/dL (ref 13.0–17.0)
LYMPHS ABS: 1.4 10*3/uL (ref 0.7–4.0)
LYMPHS PCT: 10 % — AB (ref 12–46)
MCH: 29.9 pg (ref 26.0–34.0)
MCHC: 34.6 g/dL (ref 30.0–36.0)
MCV: 86.5 fL (ref 78.0–100.0)
MONOS PCT: 8 % (ref 3–12)
Monocytes Absolute: 1 10*3/uL (ref 0.1–1.0)
NEUTROS PCT: 82 % — AB (ref 43–77)
Neutro Abs: 10.8 10*3/uL — ABNORMAL HIGH (ref 1.7–7.7)
PLATELETS: 288 10*3/uL (ref 150–400)
RBC: 5.55 MIL/uL (ref 4.22–5.81)
RDW: 13.2 % (ref 11.5–15.5)
WBC: 13.2 10*3/uL — AB (ref 4.0–10.5)

## 2014-08-09 LAB — COMPREHENSIVE METABOLIC PANEL
ALK PHOS: 92 U/L (ref 39–117)
ALT: 24 U/L (ref 0–53)
ANION GAP: 16 — AB (ref 5–15)
AST: 48 U/L — ABNORMAL HIGH (ref 0–37)
Albumin: 4.2 g/dL (ref 3.5–5.2)
BILIRUBIN TOTAL: 1.5 mg/dL — AB (ref 0.3–1.2)
BUN: 20 mg/dL (ref 6–23)
CO2: 27 meq/L (ref 19–32)
Calcium: 9.9 mg/dL (ref 8.4–10.5)
Chloride: 98 mEq/L (ref 96–112)
Creatinine, Ser: 0.92 mg/dL (ref 0.50–1.35)
GFR calc Af Amer: >90 mL/min (ref >90)
GFR, EST NON AFRICAN AMERICAN: 89 mL/min — AB (ref >90)
Glucose, Bld: 128 mg/dL — ABNORMAL HIGH (ref 70–99)
POTASSIUM: 3.8 meq/L (ref 3.7–5.3)
SODIUM: 141 meq/L (ref 137–147)
TOTAL PROTEIN: 8 g/dL (ref 6.0–8.3)

## 2014-08-09 LAB — URINE MICROSCOPIC-ADD ON

## 2014-08-09 LAB — LIPASE, BLOOD: Lipase: 17 U/L (ref 11–59)

## 2014-08-09 LAB — HIV ANTIBODY (ROUTINE TESTING W REFLEX): HIV: NONREACTIVE

## 2014-08-09 LAB — HEPATITIS B SURFACE ANTIGEN: Hepatitis B Surface Ag: NEGATIVE

## 2014-08-09 MED ORDER — HYDRALAZINE HCL 25 MG PO TABS
25.0000 mg | ORAL_TABLET | Freq: Three times a day (TID) | ORAL | Status: DC
  Filled 2014-08-09 (×2): qty 1

## 2014-08-09 MED ORDER — SODIUM CHLORIDE 0.9 % IV SOLN: INTRAVENOUS | Status: DC

## 2014-08-09 MED ORDER — POLYETHYLENE GLYCOL 3350 17 G PO PACK
17.0000 g | PACK | Freq: Every day | ORAL | Status: DC
  Administered 2014-08-09 – 2014-08-11 (×3): 17 g via ORAL
  Filled 2014-08-09 (×3): qty 1

## 2014-08-09 MED ORDER — ACETAMINOPHEN 650 MG RE SUPP: 650.0000 mg | Freq: Four times a day (QID) | RECTAL | Status: DC | PRN

## 2014-08-09 MED ORDER — POLYETHYLENE GLYCOL 3350 17 G PO PACK
17.0000 g | PACK | Freq: Every day | ORAL | Status: DC
  Filled 2014-08-09: qty 1

## 2014-08-09 MED ORDER — METOCLOPRAMIDE HCL 5 MG/ML IJ SOLN
10.0000 mg | Freq: Once | INTRAMUSCULAR | Status: AC
  Administered 2014-08-09: 10 mg via INTRAVENOUS
  Filled 2014-08-09: qty 2

## 2014-08-09 MED ORDER — PROMETHAZINE HCL 25 MG/ML IJ SOLN: 25.0000 mg | Freq: Once | INTRAMUSCULAR | Status: DC

## 2014-08-09 MED ORDER — HYDRALAZINE HCL 20 MG/ML IJ SOLN
5.0000 mg | Freq: Four times a day (QID) | INTRAMUSCULAR | Status: DC | PRN
  Administered 2014-08-09: 5 mg via INTRAVENOUS
  Filled 2014-08-09 (×2): qty 1

## 2014-08-09 MED ORDER — PROMETHAZINE HCL 25 MG/ML IJ SOLN
25.0000 mg | Freq: Once | INTRAMUSCULAR | Status: DC
  Filled 2014-08-09: qty 1

## 2014-08-09 MED ORDER — ONDANSETRON HCL 4 MG/2ML IJ SOLN
4.0000 mg | Freq: Four times a day (QID) | INTRAMUSCULAR | Status: DC | PRN
  Administered 2014-08-09: 4 mg via INTRAVENOUS
  Filled 2014-08-09 (×2): qty 2

## 2014-08-09 MED ORDER — NICOTINE 21 MG/24HR TD PT24
21.0000 mg | MEDICATED_PATCH | Freq: Every day | TRANSDERMAL | Status: DC
  Filled 2014-08-09 (×2): qty 1

## 2014-08-09 MED ORDER — IOHEXOL 300 MG/ML  SOLN
100.0000 mL | Freq: Once | INTRAMUSCULAR | Status: AC | PRN
  Administered 2014-08-09: 100 mL via INTRAVENOUS

## 2014-08-09 MED ORDER — PROMETHAZINE HCL 25 MG/ML IJ SOLN
25.0000 mg | Freq: Four times a day (QID) | INTRAMUSCULAR | Status: DC | PRN
  Administered 2014-08-09: 25 mg via INTRAMUSCULAR
  Filled 2014-08-09 (×3): qty 1

## 2014-08-09 MED ORDER — HYDRALAZINE HCL 20 MG/ML IJ SOLN: 5.0000 mg | Freq: Four times a day (QID) | INTRAMUSCULAR | Status: DC | PRN

## 2014-08-09 MED ORDER — ONDANSETRON HCL 4 MG PO TABS: 4.0000 mg | ORAL_TABLET | Freq: Four times a day (QID) | ORAL | Status: DC | PRN

## 2014-08-09 MED ORDER — PROMETHAZINE HCL 25 MG/ML IJ SOLN
25.0000 mg | Freq: Once | INTRAMUSCULAR | Status: AC
  Administered 2014-08-09: 25 mg via INTRAMUSCULAR

## 2014-08-09 MED ORDER — SODIUM CHLORIDE 0.9 % IV SOLN
INTRAVENOUS | Status: DC
  Administered 2014-08-09: 15:00:00 via INTRAVENOUS

## 2014-08-09 MED ORDER — DIPHENHYDRAMINE HCL 50 MG/ML IJ SOLN
25.0000 mg | Freq: Once | INTRAMUSCULAR | Status: AC
  Administered 2014-08-09: 25 mg via INTRAVENOUS
  Filled 2014-08-09: qty 1

## 2014-08-09 MED ORDER — SODIUM CHLORIDE 0.9 % IV SOLN
INTRAVENOUS | Status: DC
  Administered 2014-08-09 – 2014-08-11 (×4): via INTRAVENOUS

## 2014-08-09 MED ORDER — DOCUSATE SODIUM 100 MG PO CAPS: 100.0000 mg | ORAL_CAPSULE | Freq: Two times a day (BID) | ORAL | Status: DC

## 2014-08-09 MED ORDER — HYDRALAZINE HCL 10 MG PO TABS
10.0000 mg | ORAL_TABLET | Freq: Four times a day (QID) | ORAL | Status: DC
  Administered 2014-08-09 – 2014-08-11 (×7): 10 mg via ORAL
  Filled 2014-08-09 (×10): qty 1

## 2014-08-09 MED ORDER — LORAZEPAM 2 MG/ML IJ SOLN
1.0000 mg | Freq: Once | INTRAMUSCULAR | Status: AC
Start: 2014-08-09 — End: 2014-08-09
  Administered 2014-08-09: 1 mg via INTRAVENOUS
  Filled 2014-08-09: qty 1

## 2014-08-09 MED ORDER — ONDANSETRON HCL 4 MG/2ML IJ SOLN
4.0000 mg | Freq: Four times a day (QID) | INTRAMUSCULAR | Status: DC | PRN
  Filled 2014-08-09: qty 2

## 2014-08-09 MED ORDER — SODIUM CHLORIDE 0.9 % IV BOLUS (SEPSIS)
1000.0000 mL | Freq: Once | INTRAVENOUS | Status: AC
  Administered 2014-08-09: 1000 mL via INTRAVENOUS

## 2014-08-09 MED ORDER — NICOTINE 21 MG/24HR TD PT24
21.0000 mg | MEDICATED_PATCH | Freq: Every day | TRANSDERMAL | Status: DC
  Administered 2014-08-09: 21 mg via TRANSDERMAL
  Filled 2014-08-09: qty 1

## 2014-08-09 MED ORDER — ACETAMINOPHEN 325 MG PO TABS
650.0000 mg | ORAL_TABLET | Freq: Four times a day (QID) | ORAL | Status: DC | PRN
  Administered 2014-08-09 – 2014-08-11 (×3): 650 mg via ORAL
  Filled 2014-08-09 (×3): qty 2

## 2014-08-09 MED ORDER — ACETAMINOPHEN 325 MG PO TABS: 650.0000 mg | ORAL_TABLET | Freq: Four times a day (QID) | ORAL | Status: DC | PRN

## 2014-08-09 MED ORDER — ENOXAPARIN SODIUM 40 MG/0.4ML ~~LOC~~ SOLN
40.0000 mg | SUBCUTANEOUS | Status: DC
  Filled 2014-08-09: qty 0.4

## 2014-08-09 MED ORDER — ENOXAPARIN SODIUM 40 MG/0.4ML ~~LOC~~ SOLN
40.0000 mg | Freq: Every day | SUBCUTANEOUS | Status: DC
  Administered 2014-08-09 – 2014-08-10 (×2): 40 mg via SUBCUTANEOUS
  Filled 2014-08-09 (×3): qty 0.4

## 2014-08-09 MED ORDER — DOCUSATE SODIUM 100 MG PO CAPS
100.0000 mg | ORAL_CAPSULE | Freq: Two times a day (BID) | ORAL | Status: DC
  Administered 2014-08-09 – 2014-08-11 (×4): 100 mg via ORAL
  Filled 2014-08-09 (×5): qty 1

## 2014-08-09 NOTE — ED Notes (Signed)
Attempted to call report

## 2014-08-09 NOTE — Progress Notes (Signed)
Patient ID: Gregory Price, male   DOB: July 06, 1951, 63 y.o.   MRN: [id number] D: Client sitting on side of bed reports he had thrown up, vomitus yellow to greenish tint noted in the garbage container 300 cc. A: Writer applied cold compress, client alert and oriented, VS taken Sitting: T- 99.5, BP 137/75, P67, 02 Sat 100% on Room air, standing BP 123/82, P 96. Writer will monitor for changes in orientation. R: Client is safe on unit.

## 2014-08-09 NOTE — H&P (Deleted)
History and Physical  Gregory Price RSW:[id number] DOB: 1951/07/11 DOA: 08/07/2014  Referring physician: Vivi Martens, ER physician PCP: Patient goes to the [facility]   Chief Complaint: Nausea and vomiting  HPI: Gregory Price is a 63 y.o. male  Who is homeless with past medical history of COPD, hepatitis and opiate abuse who is homeless who presented to the emergency room on 10/30 with thoughts of suicide and was admitted to the behavioral health service. At that time, he told Behavioral Health that he wanted to detox from heroin. Patient started having repeated nausea and vomiting, uncontrolled and was then sent over to the emergency room today, 10/31 for further evaluation. Despite attempts with medications for nausea control, patient's vomiting was persisting. It was felt the patient would likely need to stay in the hospital for at least a day with IV medication intervention. Hospitalist will call for further evaluation.  In the emergency room, lab work was done noting a mildly elevated leukocytosis of 13 which was normal yesterday. An abdominal CT was done which noted constipation throughout and some incidental findings of hepatic steatosis, renal cysts and incidental adenomas.   Review of Systems:  Patient seen in emergency room. Pt complains of continued nausea and vomiting, abdominal cramping and weakness. He suffers from chronic constipation  Pt denies any headaches, vision changes, dysphagia, chest pain, palpitations, shortness of breath, wheeze, cough, hematuria, dysuria, diarrhea, focal extremity numbness or weakness or pain.  Review of systems are otherwise negative  Past Medical History  Diagnosis Date  . Lung cancer     resolved in 2011  . Hepatitis C   . COPD (chronic obstructive pulmonary disease)    Past Surgical History  Procedure Laterality Date  . Abdominal surgery      2008  . Lung surgery      2011  . Appendectomy    . Knee surgery      right knee, 1967    Social History:  reports that he has been smoking Cigarettes.  He has a 15 pack-year smoking history. He does not have any smokeless tobacco history on file. He reports that he uses illicit drugs (Heroin). He reports that he does not drink alcohol. Patient is homeless, but is able to participate in activities of daily living without assistance.   No Known Allergies  Family history: Discussed with patient and he states that no medical problems run in his family   Prior to Admission medications   Not on File    Physical Exam: BP 175/81  Pulse 53  Temp(Src) 98.4 F (36.9 C) (Oral)  Resp 23  Ht 5\' 3"  (1.6 m)  Wt 58.06 kg (128 lb)  BMI 22.68 kg/m2  SpO2 100%  Gen.: Alert and oriented 3, mild distress secondary to nausea and vomiting Eyes:Sclera nonicteric, extraocular movements are intact HEENT: Normocephalic, atraumatic, mucous members are dry  neck: Supple, no JVD cardiovascular: Regular rate and rhythm, S1-S2 Respiratory: Clear to auscultation bilaterally Abdomen: Soft, nontender, nondistended, hyperactive bowel sounds Skin:Tanned skin, no skin breaks tears or lesions Musculoskeletal: No clubbing or cyanosis or edema psychiatric: patient is appropriate, no evidence of psychoses. Currently he does not express suicidal ideation neurologic: No overt deficits          Labs on Admission:  Basic Metabolic Panel:  Recent Labs Lab 08/07/14 1117 08/09/14 1302  NA 135* 141  K 4.2 3.8  CL 101 98  CO2 25 27  GLUCOSE 104* 128*  BUN 9 20  CREATININE 0.72 0.92  CALCIUM 8.8 9.9   Liver Function Tests:  Recent Labs Lab 08/07/14 1117 08/09/14 1302  AST 19 48*  ALT 20 24  ALKPHOS 84 92  BILITOT 0.5 1.5*  PROT 6.6 8.0  ALBUMIN 3.6 4.2    Recent Labs Lab 08/09/14 1302  LIPASE 17   No results found for this basename: AMMONIA,  in the last 168 hours CBC:  Recent Labs Lab 08/07/14 1117 08/09/14 1302  WBC 6.2 13.2*  NEUTROABS  --  10.8*  HGB 13.3 16.6  HCT  40.2 48.0  MCV 88.9 86.5  PLT 208 288   Cardiac Enzymes: No results found for this basename: CKTOTAL, CKMB, CKMBINDEX, TROPONINI,  in the last 168 hours  BNP (last 3 results) No results found for this basename: PROBNP,  in the last 8760 hours CBG: No results found for this basename: GLUCAP,  in the last 168 hours  Radiological Exams on Admission: Ct Abdomen Pelvis W Contrast  08/09/2014   CLINICAL DATA:  Nausea and vomiting ; opiate withdrawal; leukocytosis  EXAM: CT ABDOMEN AND PELVIS WITH CONTRAST  TECHNIQUE: Multidetector CT imaging of the abdomen and pelvis was performed using the standard protocol following bolus administration of intravenous contrast.  CONTRAST:  137mL OMNIPAQUE IOHEXOL 300 MG/ML  SOLN  COMPARISON:  October 06, 2005  FINDINGS: There is mild bibasilar lung scarring, more on the right than on the left. There is no edema or consolidation in the lung bases.  There is hepatic steatosis. There is a linear area of enhancement in the medial segment of the left lobe of the liver consistent with a benign vascular shunt, a finding that is felt to have been present on the previous study of 0 slightly better seen at this time. No focal liver lesions beyond this benign-appearing vascular shunt seen. Gallbladder wall is not thickened. There is no appreciable biliary duct dilatation.  Spleen and pancreas appear normal. There is a stable benign apparent adenoma in the medial right adrenal measuring 1.2 x 0.9 cm. There is a benign adenoma in the left adrenal measuring 1.0 x 1.0 cm. Adrenals otherwise appear normal bilaterally. There is a simple cyst in the upper pole of the left kidney measuring 2.6 x 2.2 cm. There is a 9 x 9 mm simple cyst in the posterior upper pole left kidney. There is a simple cyst in the lower pole of the right kidney measuring 1.6 x 1.5 cm. No non cystic renal masses are identified on either side. There is no hydronephrosis on either side. There is no renal or ureteral  calculus on either side.  In the pelvis, there is moderate generalized urinary bladder wall thickening consistent with a degree of cystitis. There is no pelvic mass or pelvic fluid collection. Appendix is absent.  There is stool throughout much of the colon. There is no bowel obstruction. No free air or portal venous air. There is currently no significant bowel wall thickening. There is no ascites, adenopathy, or abscess in the abdomen or pelvis. There is atherosclerotic change in the aorta and iliac arteries without appreciable aneurysm. There is no major mesenteric arterial narrowing. There is degenerative change in the lumbar spine. There are no blastic or lytic bone lesions.  IMPRESSION: Urinary bladder wall thickening consistent with a degree of cystitis.  No bowel obstruction. No abscess. Appendix absent.1 no appreciable bowel wall thickening. Question a degree of constipation.  Hepatic steatosis. 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 Small adrenal adenomas, benign findings, present.  Small cysts in each kidney.   Electronically Signed   By: Lowella Grip M.D.   On: 08/09/2014 14:39    EKG: Independently revnormal sinus rhythm, borderline shortened PR interval, borderline right axis deviation   Assessment/Plan Present on Admission:  . Opiate withdrawal-presenting as persistent nausea and vomiting: Despite mild leukocytosis, do not think that this is infectious. Treat supportively with IV fluids, IV Phenergan and Zofran. Treat constipation.   Elevated blood pressures: Secondary to opiate withdrawal. Avoiding clonidine given patient's mild bradycardia. We'll use hydralazine scheduled plus when necessary IV hydralazine.   History of COPD: Stable.   History of hepatitis C: Stable.   . Nausea & vomiting . Depression with suicidal ideation: Stable. Since he is suicidal ideation, will continue sitter. [facility] will manage  . Hepatic steatosis: Incidentally noted on CT.  Marland Kitchen  Constipation due to opioid therapy: Seen on CT. Likely secondary to chronic opiate abuse. Started on Colace plus daily Mira lax. If no large bowel movement by tomorrow morning, could try something stronger.  . Leukocytosis: Likely stress margination. No signs of infection. Recheck tomorrow.  . Tobacco abuse: Nicotine patch  Consultants: None  CODE STATUS: Full code  Family Communication:  patient declined for me to call family  Disposition Plan: Monitor overnight, hopefully able to discharge tomorrow back to behavioral health  Time spent:35 minutes   [facility] Pager [phone]

## 2014-08-09 NOTE — Progress Notes (Signed)
Pt admitted to unit at around 1820. Alert and oriented and accompanied by suicide sitter, who remains at bedside at this moment. Pt in no distress at the moment.vw, rn.

## 2014-08-09 NOTE — ED Notes (Signed)
Pt aware of the need for a urine sample. 

## 2014-08-09 NOTE — Progress Notes (Signed)
Patient ID: Gregory Price, male   DOB: 29-Dec-1950, 63 y.o.   MRN: [id number] D: client lying in bed eyes closed. A: Writer attempted to give Zofran for nausea/vomiting. R: Client refused, client notified Clydene Fake RN, AC, called Sharlene Motts NP

## 2014-08-09 NOTE — Progress Notes (Signed)
[facility] MD Progress Note  08/09/2014 1:55 PM Gregory Price  MRN:  [id number] Subjective:   Patient states he feels " sick" and is having significant nausea and vomiting related to opiate withdrawal Objective:  I have discussed case with Nursing Staff and have met with patient along with Nursing Staff. Patient has had significant opiate withdrawal symptoms, and has had several instances of vomiting. He has had a difficult time " keeping anything down" and has been unable to take PO meds due to vomiting. He did improve partially with IM Phenergan, but continues to have " dry heaves" He states he feels " pretty miserable", but denies any suicidal ideations or psychotic symptoms. No disruptive behaviors on unit. Although vitals stable, patient does state he is " thirsty" and eyes seem sunken / oral mucosa dry.   Diagnosis:  Opiate Dependence, Opiate Withdrawal   Total Time spent with patient: 20 minutes    ADL's: poor   Sleep: poor   Appetite:  Poor - not tolerating PO at this time   Suicidal Ideation:  Denies  Homicidal Ideation:  Denies  AEB (as evidenced by):  Psychiatric Specialty Exam: Physical Exam  Review of Systems  Constitutional: Negative for fever and chills.  Respiratory: Negative for cough and shortness of breath.   Cardiovascular: Negative for chest pain.  Gastrointestinal: Positive for nausea and vomiting.  Psychiatric/Behavioral: Positive for depression and substance abuse. Negative for suicidal ideas.    Blood pressure 112/73, pulse 76, temperature 98.6 F (37 C), temperature source Oral, resp. rate 16, SpO2 99.00%.There is no height or weight on file to calculate BMI.  General Appearance: Fairly Groomed  Engineer, water::  Fair  Speech:  Normal Rate  Volume:  Decreased  Mood:  Depressed  Affect:  constricted but reactive  Thought Process:  Goal Directed and Linear  Orientation:  Other:  alert and attentive, not delirious at present   Thought Content:  no  hallucinations, no delusions  Suicidal Thoughts:  No- at this time denies plan or intention of hurting self  Homicidal Thoughts:  No  Memory:  recent and remote grossly intact  Judgement:  Fair  Insight:  Fair  Psychomotor Activity:  Decreased  Concentration:  Good  Recall:  Good  Fund of Knowledge:Good  Language: Good  Akathisia:  Negative  Handed:  Right  AIMS (if indicated):     Assets:  Desire for Improvement Resilience  Sleep:      Musculoskeletal: Strength & Muscle Tone: within normal limits Gait & Station: gait not examined- patient in bed  Patient leans: N/A  Current Medications: No current facility-administered medications for this encounter.   No current outpatient prescriptions on file.   Facility-Administered Medications Ordered in Other Encounters  Medication Dose Route Frequency Provider Last Rate Last Dose  . 0.9 %  sodium chloride infusion   Intravenous Continuous Leota Jacobsen, MD      . acetaminophen (TYLENOL) tablet 650 mg  650 mg Oral Q6H PRN Ursula Alert, MD      . alum & mag hydroxide-simeth (MAALOX/MYLANTA) 200-200-20 MG/5ML suspension 30 mL  30 mL Oral Q4H PRN Shuvon Rankin, NP      . cloNIDine (CATAPRES) tablet 0.1 mg  0.1 mg Oral QID Shuvon Rankin, NP   0.1 mg at 08/08/14 2140   Followed by  . cloNIDine (CATAPRES) tablet 0.1 mg  0.1 mg Oral BH-qamhs Shuvon Rankin, NP       Followed by  . [START ON 08/12/2014] cloNIDine (CATAPRES) tablet 0.1  mg  0.1 mg Oral QAC breakfast Shuvon Rankin, NP      . dicyclomine (BENTYL) tablet 20 mg  20 mg Oral Q6H PRN Shuvon Rankin, NP      . feeding supplement (ENSURE COMPLETE) (ENSURE COMPLETE) liquid 237 mL  237 mL Oral BID BM Darrol Jump, RD      . hydrOXYzine (ATARAX/VISTARIL) tablet 25 mg  25 mg Oral Q6H PRN Shuvon Rankin, NP      . Influenza vac split quadrivalent PF (FLUARIX) injection 0.5 mL  0.5 mL Intramuscular Tomorrow-1000 Saramma Eappen, MD      . loperamide (IMODIUM) capsule 2-4 mg  2-4 mg Oral PRN  Shuvon Rankin, NP      . magnesium hydroxide (MILK OF MAGNESIA) suspension 30 mL  30 mL Oral Daily PRN Shuvon Rankin, NP      . methocarbamol (ROBAXIN) tablet 500 mg  500 mg Oral Q8H PRN Shuvon Rankin, NP      . naproxen (NAPROSYN) tablet 500 mg  500 mg Oral BID PRN Shuvon Rankin, NP      . ondansetron (ZOFRAN-ODT) disintegrating tablet 4 mg  4 mg Oral Q6H PRN Shuvon Rankin, NP   4 mg at 08/08/14 2002  . pneumococcal 23 valent vaccine (PNU-IMMUNE) injection 0.5 mL  0.5 mL Intramuscular Tomorrow-1000 Saramma Eappen, MD      . promethazine (PHENERGAN) injection 25 mg  25 mg Intramuscular Q6H PRN Waylan Boga, NP   25 mg at 08/09/14 7681    Lab Results: No results found for this or any previous visit (from the past 48 hour(s)).  Physical Findings: AIMS:  , ,  ,  ,    CIWA:    COWS:     Assessment: Patient presenting with severe opiate withdrawal and has developed serious nausea and repeated episodes of vomiting only partially responsive to phenergan. Vomiting is disrupting his ability to take PO fluids and meds. Vitals are stable but is thirsty and mucosal surfaces dry.   Treatment Plan Summary: Daily contact with patient to assess and evaluate symptoms and progress in treatment Medication management See below   Plan: As discussed with Nursing Staff and patient , transfer to [facility] ER for IV hydration and management.  Will follow  Medical Decision Making Problem Points:  Established problem, worsening (2), Review of last therapy session (1) and Review of psycho-social stressors (1) Data Points:  Review of medication regiment & side effects (2) Review of new medications or change in dosage (2)  I certify that inpatient services furnished can reasonably be expected to improve the patient's condition.   Nancy Arvin, [place] 08/09/2014, 1:55 PM

## 2014-08-09 NOTE — ED Provider Notes (Signed)
CSN: [id number]     Arrival date & time 08/09/14  1220 History   First MD Initiated Contact with Patient 08/09/14 1235     Chief Complaint  Patient presents with  . Dizziness     (Consider location/radiation/quality/duration/timing/severity/associated sxs/prior Treatment) HPI Comments: Patient here with anorexia with nonbilious vomiting 2 days. Currently receiving detox from opiates. Denies any abdominal pain. No black or bloody stools. No anginal type chest pain. Has been unable to keep down liquids despite being given anti-medics. Things are persistent nothing makes them better. No prior history of same. Denies any urinary symptoms. No fever or chills. No cough or congestion.  Patient is a 63 y.o. male presenting with dizziness. The history is provided by the patient and medical records.  Dizziness   Past Medical History  Diagnosis Date  . Lung cancer     resolved in 2011  . Hepatitis C   . COPD (chronic obstructive pulmonary disease)    Past Surgical History  Procedure Laterality Date  . Abdominal surgery      2008  . Lung surgery      2011  . Appendectomy    . Knee surgery      right knee, 1967   History reviewed. No pertinent family history. History  Substance Use Topics  . Smoking status: Current Every Day Smoker -- 1.00 packs/day for 15 years    Types: Cigarettes  . Smokeless tobacco: Not on file  . Alcohol Use: No    Review of Systems  Neurological: Positive for dizziness.  All other systems reviewed and are negative.     Allergies  Review of patient's allergies indicates no known allergies.  Home Medications   Prior to Admission medications   Not on File   BP 108/81  Pulse 102  Temp(Src) 98.4 F (36.9 C) (Oral)  Resp 2  Ht 5\' 3"  (1.6 m)  Wt 128 lb (58.06 kg)  BMI 22.68 kg/m2  SpO2 99% Physical Exam  Nursing note and vitals reviewed. Constitutional: He is oriented to person, place, and time. He appears well-developed and well-nourished.   Non-toxic appearance. No distress.  HENT:  Head: Normocephalic and atraumatic.  Eyes: Conjunctivae, EOM and lids are normal. Pupils are equal, round, and reactive to light.  Neck: Normal range of motion. Neck supple. No tracheal deviation present. No mass present.  Cardiovascular: Normal rate, regular rhythm and normal heart sounds.  Exam reveals no gallop.   No murmur heard. Pulmonary/Chest: Effort normal and breath sounds normal. No stridor. No respiratory distress. He has no decreased breath sounds. He has no wheezes. He has no rhonchi. He has no rales.  Abdominal: Soft. Normal appearance and bowel sounds are normal. He exhibits no distension. There is no tenderness. There is no rebound and no CVA tenderness.  Musculoskeletal: Normal range of motion. He exhibits no edema and no tenderness.  Neurological: He is alert and oriented to person, place, and time. He has normal strength. No cranial nerve deficit or sensory deficit. GCS eye subscore is 4. GCS verbal subscore is 5. GCS motor subscore is 6.  Skin: Skin is warm and dry. No abrasion and no rash noted.  Psychiatric: He has a normal mood and affect. His speech is normal and behavior is normal.    ED Course  Procedures (including critical care time) Labs Review Labs Reviewed  HIV ANTIBODY (ROUTINE TESTING)  HEPATITIS B SURFACE ANTIGEN  CBC WITH DIFFERENTIAL  COMPREHENSIVE METABOLIC PANEL    Imaging Review No results  found.   EKG Interpretation None      MDM   Final diagnoses:  Substance induced mood disorder  Suicidal ideation  Opioid dependence with opioid-induced mood disorder    Patient given multiple rounds of IV fluids anti-medics and continues to have vomiting. Will be admitted for observation    Leota Jacobsen, MD 08/11/14 2322

## 2014-08-09 NOTE — Progress Notes (Signed)
Patient ID: Gregory Price, male   DOB: 07-Jan-1951, 63 y.o.   MRN: [id number] D: client in bed, reports "I threw up again" A: Writer obtained/measured vomitus 350 cc, offered Zofran. R: Client refused Zofran, applied cold cloth. Plan report to next shift for follow up.

## 2014-08-09 NOTE — Progress Notes (Signed)
Patient ID: Gregory Price, male   DOB: 17-Mar-1951, 63 y.o.   MRN: [id number] D. Patient reports persistent nausea, vomiting, shakiness. He is unable to tolerate any po medications for withdrawal, and has been unresponsive to prn phenergan doses given. Md and Probation officer in to assess patient and he reports feeling weak, malaise. He was offered ginger ale but unable to tolerate. Vitals show orthostatic changes. A. Md order received to transport patient to ER for evaluation. Nurse, mental health Gerald Stabs, and [employer] RN [employer] . Writer phoned report to [facility] ER Teaching laboratory technician. Of above information. R. Patient is resting in bed. Notified [facility]. Awaiting transport.

## 2014-08-09 NOTE — ED Notes (Signed)
Sent here by [facility] for orthostatics-dizzy when he stands up-not able to eat/drink r/t withdrawal form heroin

## 2014-08-10 DIAGNOSIS — F1123 Opioid dependence with withdrawal: Principal | ICD-10-CM

## 2014-08-10 LAB — BASIC METABOLIC PANEL
Anion gap: 14 (ref 5–15)
BUN: 20 mg/dL (ref 6–23)
CO2: 24 mEq/L (ref 19–32)
CREATININE: 0.88 mg/dL (ref 0.50–1.35)
Calcium: 9.1 mg/dL (ref 8.4–10.5)
Chloride: 106 mEq/L (ref 96–112)
Glucose, Bld: 105 mg/dL — ABNORMAL HIGH (ref 70–99)
Potassium: 3.6 mEq/L — ABNORMAL LOW (ref 3.7–5.3)
Sodium: 144 mEq/L (ref 137–147)

## 2014-08-10 LAB — CBC
HCT: 43.9 % (ref 39.0–52.0)
Hemoglobin: 14.7 g/dL (ref 13.0–17.0)
MCH: 29.5 pg (ref 26.0–34.0)
MCHC: 33.5 g/dL (ref 30.0–36.0)
MCV: 88 fL (ref 78.0–100.0)
PLATELETS: 254 10*3/uL (ref 150–400)
RBC: 4.99 MIL/uL (ref 4.22–5.81)
RDW: 13.3 % (ref 11.5–15.5)
WBC: 11.4 10*3/uL — ABNORMAL HIGH (ref 4.0–10.5)

## 2014-08-10 MED ORDER — NICOTINE 21 MG/24HR TD PT24
21.0000 mg | MEDICATED_PATCH | Freq: Every day | TRANSDERMAL | Status: DC
  Administered 2014-08-10 – 2014-08-11 (×2): 21 mg via TRANSDERMAL
  Filled 2014-08-10 (×2): qty 1

## 2014-08-10 NOTE — Plan of Care (Signed)
Problem: Phase I Progression Outcomes Goal: OOB as tolerated unless otherwise ordered Outcome: Progressing  Comments:  Patient sitting in chair > 4hrs today.

## 2014-08-10 NOTE — Progress Notes (Signed)
UR completed 

## 2014-08-10 NOTE — Progress Notes (Signed)
PROGRESS NOTE  Gregory Price DGU:[id number] DOB: [birth year] DOA: 08/09/2014 PCP: PROVIDER NOT IN SYSTEM  HPI/Recap of past 78 hours: 63 year old male admitted to behavior health on 10/30 for suicidal ideation and heroin detox brought into the hospitalist service on 10/31 for uncontrolled nausea and vomiting, felt to be secondary to heroin detoxification  Patient feeling better, not much appetite, but no forceful vomiting  Assessment/Plan: Active Problems:   Opiate withdrawal: Patient looks to be doing much better. Starting to tolerate by mouth. Nausea controlled with IV Zofran  Elevated blood pressures: On hydralazine, pressure stable  Chronic constipation secondary to opiate therapy: CT confirms large amount of stool. Has started Mira lax and Colace. Patient hesitant this morning because of loose stools, but I urged him that by treating this, this would help with his nausea  Heroin addiction: Being managed by behavioral health   Code Status: full code  Family Communication: no family  Disposition Plan: back to behavioral health tomorrow   Consultants:  none  Procedures:  none  Antibiotics:  none   Objective: BP 137/70 mmHg  Pulse 83  Temp(Src) 98.3 F (36.8 C) (Oral)  Resp 16  SpO2 99%  Intake/Output Summary (Last 24 hours) at 08/10/14 1255 Last data filed at 08/10/14 1244  Gross per 24 hour  Intake    910 ml  Output    775 ml  Net    135 ml   There were no vitals filed for this visit.  Exam:   General:  Alert and oriented 3, no acute distress  Cardiovascular: regular rate and rhythm, S1-S2  Respiratory: clear to auscultation bilaterally  Abdomen: soft, nontender, nondistended, positive bowel sounds  Musculoskeletal: no clubbing or cyanosis or edema   Data Reviewed: Basic Metabolic Panel:  Recent Labs Lab 08/07/14 1117 08/09/14 1302 08/10/14 0440  NA 135* 141 144  K 4.2 3.8 3.6*  CL 101 98 106  CO2 25 27 24   GLUCOSE 104* 128* 105*   BUN 9 20 20   CREATININE 0.72 0.92 0.88  CALCIUM 8.8 9.9 9.1   Liver Function Tests:  Recent Labs Lab 08/07/14 1117 08/09/14 1302  AST 19 48*  ALT 20 24  ALKPHOS 84 92  BILITOT 0.5 1.5*  PROT 6.6 8.0  ALBUMIN 3.6 4.2    Recent Labs Lab 08/09/14 1302  LIPASE 17   No results for input(s): AMMONIA in the last 168 hours. CBC:  Recent Labs Lab 08/07/14 1117 08/09/14 1302 08/10/14 0440  WBC 6.2 13.2* 11.4*  NEUTROABS  --  10.8*  --   HGB 13.3 16.6 14.7  HCT 40.2 48.0 43.9  MCV 88.9 86.5 88.0  PLT 208 288 254   Cardiac Enzymes:   No results for input(s): CKTOTAL, CKMB, CKMBINDEX, TROPONINI in the last 168 hours. BNP (last 3 results) No results for input(s): PROBNP in the last 8760 hours. CBG: No results for input(s): GLUCAP in the last 168 hours.  No results found for this or any previous visit (from the past 240 hour(s)).   Studies: Ct Abdomen Pelvis W Contrast  08/09/2014   CLINICAL DATA:  Nausea and vomiting ; opiate withdrawal; leukocytosis  EXAM: CT ABDOMEN AND PELVIS WITH CONTRAST  TECHNIQUE: Multidetector CT imaging of the abdomen and pelvis was performed using the standard protocol following bolus administration of intravenous contrast.  CONTRAST:  144mL OMNIPAQUE IOHEXOL 300 MG/ML  SOLN  COMPARISON:  October 06, 2005  FINDINGS: There is mild bibasilar lung scarring, more on the right than on the  left. There is no edema or consolidation in the lung bases.  There is hepatic steatosis. There is a linear area of enhancement in the medial segment of the left lobe of the liver consistent with a benign vascular shunt, a finding that is felt to have been present on the previous study of 0 slightly better seen at this time. No focal liver lesions beyond this benign-appearing vascular shunt seen. Gallbladder wall is not thickened. There is no appreciable biliary duct dilatation.  Spleen and pancreas appear normal. There is a stable benign apparent adenoma in the medial  right adrenal measuring 1.2 x 0.9 cm. There is a benign adenoma in the left adrenal measuring 1.0 x 1.0 cm. Adrenals otherwise appear normal bilaterally. There is a simple cyst in the upper pole of the left kidney measuring 2.6 x 2.2 cm. There is a 9 x 9 mm simple cyst in the posterior upper pole left kidney. There is a simple cyst in the lower pole of the right kidney measuring 1.6 x 1.5 cm. No non cystic renal masses are identified on either side. There is no hydronephrosis on either side. There is no renal or ureteral calculus on either side.  In the pelvis, there is moderate generalized urinary bladder wall thickening consistent with a degree of cystitis. There is no pelvic mass or pelvic fluid collection. Appendix is absent.  There is stool throughout much of the colon. There is no bowel obstruction. No free air or portal venous air. There is currently no significant bowel wall thickening. There is no ascites, adenopathy, or abscess in the abdomen or pelvis. There is atherosclerotic change in the aorta and iliac arteries without appreciable aneurysm. There is no major mesenteric arterial narrowing. There is degenerative change in the lumbar spine. There are no blastic or lytic bone lesions.  IMPRESSION: Urinary bladder wall thickening consistent with a degree of cystitis.  No bowel obstruction. No abscess. Appendix absent.1 no appreciable bowel wall thickening. Question a degree of constipation.  Hepatic steatosis. 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 Small adrenal adenomas, benign findings, present. Small cysts in each kidney.   Electronically Signed   By: Lowella Grip M.D.   On: 08/09/2014 14:39    Scheduled Meds: . docusate sodium  100 mg Oral BID  . enoxaparin (LOVENOX) injection  40 mg Subcutaneous QHS  . hydrALAZINE  10 mg Oral 4 times per day  . nicotine  21 mg Transdermal Daily  . polyethylene glycol  17 g Oral Daily    Continuous Infusions: . sodium chloride 75  mL/hr at 08/10/14 1025     Time spent: 15 minutes  [facility] Pager [phone]. If 7PM-7AM, please contact night-coverage at [email or web address], password [facility] 08/10/2014, 12:55 PM  LOS: 1 day

## 2014-08-11 DIAGNOSIS — F1123 Opioid dependence with withdrawal: Secondary | ICD-10-CM | POA: Diagnosis not present

## 2014-08-11 DIAGNOSIS — Z590 Homelessness: Secondary | ICD-10-CM

## 2014-08-11 DIAGNOSIS — K5909 Other constipation: Secondary | ICD-10-CM

## 2014-08-11 DIAGNOSIS — F1124 Opioid dependence with opioid-induced mood disorder: Secondary | ICD-10-CM

## 2014-08-11 DIAGNOSIS — I10 Essential (primary) hypertension: Secondary | ICD-10-CM

## 2014-08-11 MED ORDER — HYDRALAZINE HCL 10 MG PO TABS: 10.0000 mg | ORAL_TABLET | Freq: Three times a day (TID) | ORAL | Status: DC

## 2014-08-11 MED ORDER — MIRTAZAPINE 7.5 MG PO TABS: 7.5000 mg | ORAL_TABLET | Freq: Every day | ORAL | Status: DC

## 2014-08-11 MED ORDER — MIRTAZAPINE 7.5 MG PO TABS
7.5000 mg | ORAL_TABLET | Freq: Every day | ORAL | Status: DC
  Filled 2014-08-11: qty 1

## 2014-08-11 NOTE — Discharge Summary (Signed)
Discharge Summary  Pleasant Bensinger PXT:[id number] DOB: [birth year]  [facility]: [facility] Admit date: 08/09/2014 Discharge date: 08/11/2014  Time spent: 25 minutes  Recommendations for Outpatient Follow-up:  1. Patient  Been given resources for outpatient rehabilitation for drug addiction 2. Patient being referred for housing for [facility]   Discharge Diagnoses:  Active Hospital Problems   Diagnosis Date Noted  . Opiate withdrawal 08/09/2014    Resolved Hospital Problems   Diagnosis Date Noted Date Resolved  No resolved problems to display.    Discharge Condition: improved, being discharged home  Diet recommendation: low-sodium  Filed Weights   08/10/14 0915  Weight: 55.339 kg (122 lb)    History of present illness:  63 year old male admitted to behavioral health for suicidal ideation and heroin detox on 10/30, transferred to Hospital on 10/31 for uncontrolled nausea and vomiting, felt to be secondary to alcohol withdrawal.  Hospital Course:  Principal Problem:   Opiate withdrawal: Initially had a sitter. Psychiatry on 11/2 and felt to be no longer suicidal. As he is past his opiate withdrawal, he'll to be medically stable for discharge. He is homeless. Social work has given him multiple resources including outpatient rehabilitation as well as plans for housing through the [place], although his requirement is that he be sober. uncontrolled hypertension: Unable to use clonidine due to episodes of bradycardia.managed with hydralazine and pressures have since improved. Plan to discharge on 3 times a day hydralazine  Nausea and vomiting: Felt to be second to opiate withdrawal. Started on IV Zofran. By 11/1 continue to improve and by 11/2, appetite better, and no nausea and vomiting  Constipation: On admission, abdominal CT checked and found to have large or constipation. Patient treated with Colace and Maalox and had a large number of bowel movements. This greatly helped his nausea and  vomiting.  Procedures:  none  Consultations:  psychiatry  Discharge Exam: BP 119/63 mmHg  Pulse 61  Temp(Src) 98 F (36.7 C) (Oral)  Resp 17  Ht 5\' 5"  (1.651 m)  Wt 55.339 kg (122 lb)  BMI 20.30 kg/m2  SpO2 98%  General: alert and oriented 3, no acute distress Cardiovascular: regular rate and rhythm, S1-S2 Respiratory: clear to auscultation bilaterally  Discharge Instructions You were cared for by a hospitalist during your hospital stay. If you have any questions about your discharge medications or the care you received while you were in the hospital after you are discharged, you can call the unit and asked to speak with the hospitalist on call if the hospitalist that took care of you is not available. Once you are discharged, your primary care physician will handle any further medical issues. Please note that NO REFILLS for any discharge medications will be authorized once you are discharged, as it is imperative that you return to your primary care physician (or establish a relationship with a primary care physician if you do not have one) for your aftercare needs so that they can reassess your need for medications and monitor your lab values.  Discharge Instructions    Diet - low sodium heart healthy    Complete by:  As directed      Increase activity slowly    Complete by:  As directed             Medication List    TAKE these medications        hydrALAZINE 10 MG tablet  Commonly known as:  APRESOLINE  Take 1 tablet (10 mg total) by mouth 3 (three)  times daily.     mirtazapine 7.5 MG tablet  Commonly known as:  REMERON  Take 1 tablet (7.5 mg total) by mouth at bedtime.       No Known Allergies    The results of significant diagnostics from this hospitalization (including imaging, microbiology, ancillary and laboratory) are listed below for reference.    Significant Diagnostic Studies: Ct Abdomen Pelvis W Contrast  08/09/2014   CLINICAL DATA:  Nausea and  vomiting ; opiate withdrawal; leukocytosis  EXAM: CT ABDOMEN AND PELVIS WITH CONTRAST  TECHNIQUE: Multidetector CT imaging of the abdomen and pelvis was performed using the standard protocol following bolus administration of intravenous contrast.  CONTRAST:  170mL OMNIPAQUE IOHEXOL 300 MG/ML  SOLN  COMPARISON:  October 06, 2005  FINDINGS: There is mild bibasilar lung scarring, more on the right than on the left. There is no edema or consolidation in the lung bases.  There is hepatic steatosis. There is a linear area of enhancement in the medial segment of the left lobe of the liver consistent with a benign vascular shunt, a finding that is felt to have been present on the previous study of 0 slightly better seen at this time. No focal liver lesions beyond this benign-appearing vascular shunt seen. Gallbladder wall is not thickened. There is no appreciable biliary duct dilatation.  Spleen and pancreas appear normal. There is a stable benign apparent adenoma in the medial right adrenal measuring 1.2 x 0.9 cm. There is a benign adenoma in the left adrenal measuring 1.0 x 1.0 cm. Adrenals otherwise appear normal bilaterally. There is a simple cyst in the upper pole of the left kidney measuring 2.6 x 2.2 cm. There is a 9 x 9 mm simple cyst in the posterior upper pole left kidney. There is a simple cyst in the lower pole of the right kidney measuring 1.6 x 1.5 cm. No non cystic renal masses are identified on either side. There is no hydronephrosis on either side. There is no renal or ureteral calculus on either side.  In the pelvis, there is moderate generalized urinary bladder wall thickening consistent with a degree of cystitis. There is no pelvic mass or pelvic fluid collection. Appendix is absent.  There is stool throughout much of the colon. There is no bowel obstruction. No free air or portal venous air. There is currently no significant bowel wall thickening. There is no ascites, adenopathy, or abscess in the  abdomen or pelvis. There is atherosclerotic change in the aorta and iliac arteries without appreciable aneurysm. There is no major mesenteric arterial narrowing. There is degenerative change in the lumbar spine. There are no blastic or lytic bone lesions.  IMPRESSION: Urinary bladder wall thickening consistent with a degree of cystitis.  No bowel obstruction. No abscess. Appendix absent.1 no appreciable bowel wall thickening. Question a degree of constipation.  Hepatic steatosis. 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 Small adrenal adenomas, benign findings, present. Small cysts in each kidney.   Electronically Signed   By: Lowella Grip M.D.   On: 08/09/2014 14:39    Microbiology: No results found for this or any previous visit (from the past 240 hour(s)).   Labs: Basic Metabolic Panel:  Recent Labs Lab 08/07/14 1117 08/09/14 1302 08/10/14 0440  NA 135* 141 144  K 4.2 3.8 3.6*  CL 101 98 106  CO2 25 27 24   GLUCOSE 104* 128* 105*  BUN 9 20 20   CREATININE 0.72 0.92 0.88  CALCIUM 8.8 9.9  9.1   Liver Function Tests:  Recent Labs Lab 08/07/14 1117 08/09/14 1302  AST 19 48*  ALT 20 24  ALKPHOS 84 92  BILITOT 0.5 1.5*  PROT 6.6 8.0  ALBUMIN 3.6 4.2    Recent Labs Lab 08/09/14 1302  LIPASE 17   No results for input(s): AMMONIA in the last 168 hours. CBC:  Recent Labs Lab 08/07/14 1117 08/09/14 1302 08/10/14 0440  WBC 6.2 13.2* 11.4*  NEUTROABS  --  10.8*  --   HGB 13.3 16.6 14.7  HCT 40.2 48.0 43.9  MCV 88.9 86.5 88.0  PLT 208 288 254   Cardiac Enzymes: No results for input(s): CKTOTAL, CKMB, CKMBINDEX, TROPONINI in the last 168 hours. BNP: BNP (last 3 results) No results for input(s): PROBNP in the last 8760 hours. CBG: No results for input(s): GLUCAP in the last 168 hours.     Signed:  Annita Brod  [facility] 08/11/2014, 3:29 PM

## 2014-08-11 NOTE — Progress Notes (Signed)
UR completed 

## 2014-08-11 NOTE — Consult Note (Signed)
[facility] Face-to-Face Psychiatry Consult   Reason for Consult:  Opioid withdrawal and dependence and substance induced mood disorder Referring Physician:  Dr. Maryruth Bun Toren is an 63 y.o. male. Total Time spent with patient: 45 minutes  Assessment: AXIS I:  Substance Induced Mood Disorder and Opioid withdrawal and dependence AXIS II:  Deferred AXIS III:   Past Medical History  Diagnosis Date  . Lung cancer     resolved in 2011  . Hepatitis C   . COPD (chronic obstructive pulmonary disease)    AXIS IV:  other psychosocial or environmental problems and problems related to social environment AXIS V:  41-50 serious symptoms  Plan:  Case discussed with Sindy Messing, LCSW and Dr. [place] Pink Give a trail of Remeron 7.5 mg POQhs for depression and better sleep and appetite No evidence of imminent risk to self or others at present.   Patient does not meet criteria for psychiatric inpatient admission. Supportive therapy provided about ongoing stressors. Refer to IOP. Discussed crisis plan, support from social network, calling 911, coming to the Emergency Department, and calling Suicide Hotline.  Subjective:    HPI:  Comer Devins is a 63 y.o. male  was initially admitted to [facility] for opioid detox treatment along with substance induced mood disorder from [facility] ED. Patient complaints of suicidal ideation and plan to jump into traffic while in [facility] and than denied repeatedly after admitted to [facility]. Patient required to admitted [facility] Medical floor via [facility] due to intractable vomiting instead of oral medication for nausea and vomitings. Patient has been placed on detox protocol on 10/30 which is four days treatment which he has completed at this time. He is medically stabilized on intravenous fluids and medications on medical floor and he is stable medically at this time. Patient has denied current symptoms of opioid withdrawal and depression with suicidal ideation. He appreciate the help he has been  receiving. He wishes to go to residential substance abuse rehab treatment either in [facility], [place], [place] or [place]. He has no previous psych inpatient or out patient treatment. He states that he does not want to burden to other and stay living on streets over the years. He lost his primary care and other treatment at [facility] due to not able to go there. He was previously applied for disability but did not follow through. He has a sister near [place] and has phone contact but not in contact since being in hospital. He is also wants a safe place to live on himself. His wife passed away four years ago.   Reportedly he uses 1 gm of heroin a day.  "I started out with pain medication and when couldn't afford it of get it any more I started the heroin about 5 years ago.  Patient states that he is a [employer] cabin [employer].  Patient states that he has been in detox once 2 years ago and was clean for 2 months. Patient states that he needs to get his life together and get off of the heroin.   HPI Elements:    Location:  Depression and substance abuse. Quality:  Heroin abuse and unable to self detox. Severity:  suicidal ideation and contemplating plan. Timing:  unknown psychosocial and substance abuse stress. Review of Systems  Psychiatric/Behavioral: Positive for depression, suicidal ideas and substance abuse. Negative for hallucinations and memory loss. The patient is not nervous/anxious and does not have insomnia.   All other systems reviewed and are negative.  History reviewed. No  pertinent family history.  Past Psychiatric History: Past Medical History  Diagnosis Date  . Lung cancer     resolved in 2011  . Hepatitis C   . COPD (chronic obstructive pulmonary disease)     reports that he has been smoking Cigarettes.  He has a 15 pack-year smoking history. He does not have any smokeless tobacco history on file. He reports that he uses illicit drugs (Heroin). He reports that he does not  drink alcohol. History reviewed. No pertinent family history.       Abuse/Neglect [facility]) Physical Abuse: Denies Verbal Abuse: Yes, past (Comment) Sexual Abuse: Denies Allergies:  No Known Allergies  ACT Assessment Complete:  Yes:    Educational Status    Risk to Self: Risk to self with the past 6 months Is patient at risk for suicide?: Yes Substance abuse history and/or treatment for substance abuse?: Yes  Risk to Others:    Abuse: Abuse/Neglect Assessment (Assessment to be complete while patient is alone) Physical Abuse: Denies Verbal Abuse: Yes, past (Comment) Sexual Abuse: Denies Exploitation of patient/patient's resources: Denies Self-Neglect: Yes, present (Comment)  Prior Inpatient Therapy:    Prior Outpatient Therapy:    Additional Information:     Objective: Blood pressure 127/56, pulse 56, temperature 99.6 F (37.6 C), temperature source Oral, resp. rate 16, height 5\' 5"  (1.651 m), weight 55.339 kg (122 lb), SpO2 98 %.Body mass index is 20.3 kg/(m^2). Results for orders placed or performed during the hospital encounter of 08/09/14 (from the past 72 hour(s))  Basic metabolic panel     Status: Abnormal   Collection Time: 08/10/14  4:40 AM  Result Value Ref Range   Sodium 144 137 - 147 mEq/L   Potassium 3.6 (L) 3.7 - 5.3 mEq/L   Chloride 106 96 - 112 mEq/L    Comment: DELTA CHECK NOTED   CO2 24 19 - 32 mEq/L   Glucose, Bld 105 (H) 70 - 99 mg/dL   BUN 20 6 - 23 mg/dL   Creatinine, Ser 13/01/15 0.50 - 1.35 mg/dL   Calcium 9.1 8.4 - 6.91 mg/dL   GFR calc non Af Amer >90 >90 mL/min   GFR calc Af Amer >90 >90 mL/min    Comment: (NOTE) The eGFR has been calculated using the CKD EPI equation. This calculation has not been validated in all clinical situations. eGFR's persistently <90 mL/min signify possible Chronic Kidney Disease.    Anion gap 14 5 - 15  CBC     Status: Abnormal   Collection Time: 08/10/14  4:40 AM  Result Value Ref Range   WBC 11.4 (H) 4.0 - 10.5 K/uL    RBC 4.99 4.22 - 5.81 MIL/uL   Hemoglobin 14.7 13.0 - 17.0 g/dL   HCT 13/01/15 75.2 - 93.9 %   MCV 88.0 78.0 - 100.0 fL   MCH 29.5 26.0 - 34.0 pg   MCHC 33.5 30.0 - 36.0 g/dL   RDW 70.1 25.8 - 90.9 %   Platelets 254 150 - 400 K/uL   Labs are reviewed see abnormal values above.  Medications reviewed and no changes made.  Will start Clonidine Protocol for opiate withdrawal  Current Facility-Administered Medications  Medication Dose Route Frequency Provider Last Rate Last Dose  . 0.9 %  sodium chloride infusion   Intravenous Continuous 12.7, MD 75 mL/hr at 08/11/14 1210    . acetaminophen (TYLENOL) tablet 650 mg  650 mg Oral Q6H PRN 13/02/15, MD   650 mg at  08/11/14 1210   Or  . acetaminophen (TYLENOL) suppository 650 mg  650 mg Rectal Q6H PRN Annita Brod, MD      . docusate sodium (COLACE) capsule 100 mg  100 mg Oral BID Annita Brod, MD   100 mg at 08/11/14 1109  . enoxaparin (LOVENOX) injection 40 mg  40 mg Subcutaneous QHS Annita Brod, MD   40 mg at 08/10/14 2218  . hydrALAZINE (APRESOLINE) injection 5 mg  5 mg Intravenous Q6H PRN Annita Brod, MD      . hydrALAZINE (APRESOLINE) tablet 10 mg  10 mg Oral 4 times per day Annita Brod, MD   10 mg at 08/11/14 1108  . nicotine (NICODERM CQ - dosed in mg/24 hours) patch 21 mg  21 mg Transdermal Daily Annita Brod, MD   21 mg at 08/11/14 1114  . ondansetron (ZOFRAN) tablet 4 mg  4 mg Oral Q6H PRN Annita Brod, MD       Or  . ondansetron (ZOFRAN) injection 4 mg  4 mg Intravenous Q6H PRN Annita Brod, MD   4 mg at 08/09/14 2240  . polyethylene glycol (MIRALAX / GLYCOLAX) packet 17 g  17 g Oral Daily Annita Brod, MD   17 g at 08/11/14 1109    Psychiatric Specialty Exam:     Blood pressure 127/56, pulse 56, temperature 99.6 F (37.6 C), temperature source Oral, resp. rate 16, height $RemoveBe'5\' 5"'CnAytDowY$  (1.651 m), weight 55.339 kg (122 lb), SpO2 98 %.Body mass index is 20.3 kg/(m^2).   General Appearance: Casual  Eye Contact::  Good  Speech:  Clear and Coherent and Normal Rate  Volume:  Normal  Mood:  Depressed  Affect:  Congruent and Depressed  Thought Process:  Circumstantial and Goal Directed  Orientation:  Full (Time, Place, and Person)  Thought Content:  "I want to get my life together"  Suicidal Thoughts:  No  Homicidal Thoughts:  No  Memory:  Immediate;   Good Recent;   Good Remote;   Fair  Judgement:  Fair  Insight:  Lacking  Psychomotor Activity:  Normal  Concentration:  Fair  Recall:  Good  Fund of Knowledge:Good  Language: Good  Akathisia:  No  Handed:  Right  AIMS (if indicated):     Assets:  Communication Skills Desire for Improvement  Sleep:      Musculoskeletal: Strength & Muscle Tone: within normal limits Gait & Station: normal Patient leans: N/A  Treatment Plan Summary: Daily contact with patient to assess and evaluate symptoms and progress in treatment Medication management Refer to substance abuse residential rehab treatment and Sindy Messing and Dr. Mardella Layman has been informed about the patient needs at this time.  Johnathan Heskett,JANARDHAHA R. 08/11/2014 1:26 PM

## 2014-08-11 NOTE — Progress Notes (Signed)
Clinical Social Work Department CLINICAL SOCIAL WORK PSYCHIATRY SERVICE LINE ASSESSMENT 08/11/2014  Patient:  Gregory Price  Account:  [id number]  [place] Date:  08/09/2014  Clinical Social Worker:  Sindy Messing, LCSW  Date/Time:  08/11/2014 03:30 PM Referred by:  Physician  Date referred:  08/11/2014 Reason for Referral  Psychosocial assessment   Presenting Symptoms/Problems (In the person's/family's own words):   Psych consulted due to patient being admitted from [facility]   Abuse/Neglect/Trauma History (check all that apply)  Denies history   Abuse/Neglect/Trauma Comments:   Psychiatric History (check all that apply)  Outpatient treatment  Inpatient/hospitilization  Residential treatment   Psychiatric medications:  Remeron 7.5 mg   Current Mental Health Hospitalizations/Previous Mental Health History:   Patient reports to a long history of substance abuse. Patient reports he feels depressed and had medication management at the [place] in the past but no current treatment.   [facility]   [facility]   Current Medications:   Scheduled Meds:      . docusate sodium  100 mg Oral BID  . enoxaparin (LOVENOX) injection  40 mg Subcutaneous QHS  . hydrALAZINE  10 mg Oral 4 times per day  . mirtazapine  7.5 mg Oral QHS  . nicotine  21 mg Transdermal Daily  . polyethylene glycol  17 g Oral Daily        Continuous Infusions:      . sodium chloride 75 mL/hr at 08/11/14 1210          PRN Meds:.acetaminophen **OR** acetaminophen, hydrALAZINE, ondansetron **OR** ondansetron (ZOFRAN) IV       Previous Impatient Admission/Date/Reason:   Patient admitted from [facility]. Patient reports he went for inpatient substance abuse treatment at the [place] about 2 years ago.   Emotional Health / Current Symptoms    Suicide/Self Harm  Suicidal ideation (ex: "I can't take any more,I wish I could disappear")   Suicide attempt in the past:   Patient admits to suicidal ideations in the  past but denies any current SI or HI. Patient is able to contract for safety.   Other harmful behavior:   None reported   Psychotic/Dissociative Symptoms  None reported   Other Psychotic/Dissociative Symptoms:   N/A    Attention/Behavioral Symptoms  Within Normal Limits   Other Attention / Behavioral Symptoms:   Patient engaged in assessment.    Cognitive Impairment  Within Normal Limits   Other Cognitive Impairment:   Patient alert and oriented.    Mood and Adjustment  Flat    Stress, Anxiety, Trauma, Any Recent Loss/Stressor  Grief/Loss (recent or history)   Anxiety (frequency):   N/A   Phobia (specify):   N/A   Compulsive behavior (specify):   N/A   Obsessive behavior (specify):   N/A   Other:   Wife passed away 4 years ago.   Substance Abuse/Use  Current substance use   SBIRT completed (please refer for detailed history):  N  Self-reported substance use:   Patient admits to injecting heroin 1-3 times a day on daily basis. Patient reports that he has been using for several years and has received treatment in the past.   Urinary Drug Screen Completed:  Y Alcohol level:   <11    Environmental/Housing/Living Arrangement  HOMELESS   Who is in the home:   Homeless   Emergency contact:  [employer]   Product manager   Patient's Strengths and Goals (patient's own words):  Patient has been connected with the [facility] in the past and wants to get started with services again.   Clinical Social Worker's Interpretive Summary:   CSW received referral in order to complete psychosocial assessment. CSW reviewed chart and met with psych MD at bedside with patient. CSW introduced myself and explained role.    Patient admits to long history of drug use and current heroin use. Patient has been homeless for several years and reports that he stays in [place] because he has a permit to [facility]. Patient has a dtr but reports limited support and he  has not spoken to her a couple of months. Patient reports that he has applied for disability in the past but never followed up to determine if he was approved.    Psych MD evaluated patient who contracts for safety and reports patient does not need to return to [facility]. CSW informed AC Otila Kluver) of DC plans and [facility] agreeable to have security bring patient's belongings to [facility]. Psych MD recommending intensive outpatient (IOP) or residential rehab program.    CSW spoke with several local agencies including [facility] and [facility] who reports they are unable to accept patient due to not accepting [employer] insurance. CSW spoke with transfer coordinator at [place] who reports that they do not take direct admissions and that patient would have to come through walk-in clinic on Tuesdays or Thursdays from 8:30am-11am. [facility] also provided CSW with housing resources to provide to patient where patient can meet with a representative through [facility]) each Monday for assistance. CSW provided patient with information to the [facility] who reports that patient has to be sober for 21 days prior to being considered for transitional housing program.    CSW and patient spoke about housing options at DC. Patient reports he prefers to stay on the streets and has a building that he stays behind. CSW provided patient with shelter list in case patient changes his mind and wants to stay at a shelter. CSW also provided patient with bus pass in case he wants to go to shelter.    CSW informed RN of DC plans. CSW is signing off but available if needed.   Disposition:  Outpatient referral made/needed   [place], [place] [phone]

## 2014-08-11 NOTE — Progress Notes (Signed)
Patient Discharge: Pt discharged to home (homeless) see social work note.   Disposition: Calm and appreciative of the care he received.   Education: Discharge education and instructions reviewed with pt. Pt stated understanding and all questions answered.   IV: IV DC'd  Telemetry: N/A  Follow-up appointments: Reviewed   Prescriptions: With pt  Transportation: Transported by wheelchair by nurse and nurse assistant to [employer]  Belongings: Belongings in pt possession.

## 2014-09-26 NOTE — Discharge Summary (Signed)
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 Psychiatric: He has a normal mood and affect. His behavior is normal. Judgment and thought content normal.    Review of Systems  Constitutional: Negative.   HENT: Negative.   Eyes: Negative.   Respiratory: Negative.   Gastrointestinal: Negative.   Genitourinary: Negative.   Musculoskeletal: Negative.   Skin: Negative.   Neurological: Negative.   Endo/Heme/Allergies: Negative.   Psychiatric/Behavioral: Positive for depression. Negative for suicidal ideas, hallucinations, memory loss and substance abuse (Hx of, chronic, stabilized). The patient is nervous/anxious. The patient does not have insomnia.     Blood pressure 119/63, pulse 61, temperature 98 F (36.7 C), temperature source Oral, resp. rate 17, height 5\' 5"  (1.651 m), weight 55.339 kg (122 lb), SpO2 98 %.Body mass index is 20.3 kg/(m^2).   Musculoskeletal: Strength & Muscle Tone: within normal limits- at this time no gross tremors or diaphoresis noted. Patient does not appear to be in any acute distress. Gait & Station: normal Patient leans: N/A  Past Psychiatric History: Diagnosis: States he has been diagnosed with Depression, and describes history of Opiate Dependence. Denies history of psychosis or mania.  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 Lung cancer     resolved in 2011  . Hepatitis C   . COPD (chronic obstructive pulmonary disease)    AXIS IV: economic problems, housing problems, occupational problems and problems related to social environment AXIS V: 41-50 serious symptoms  Level of Care:  OP  Hospital Course:  Patient is A 63 year old man, who reports history of opiate ( IV Heroin) dependence and depression.  Additionally, he describes significant neuro-vegetative symptoms of depression.  He has been using " 100 dollars a day" over recent weeks to months, and last used two days ago.  He states " I am tired of this- I need detox".  He presented to ER requesting detoxification and describing depression and suicidal ideations. He states he has been fantasizing about walking in front of a car.  When he presented to the ED, patient denied active suicidal ideations and contracts for safety on the unit.  He endorsed active opiate withdrawal symptoms such as chills, nausea, some muscle aches, rhinorrhea, watery eyes. He expresses concern that " the worst has not started yet".  Patient was admitted to [facility] for further management of his crisis event.  He was also monitored for safety and any withdrawal symptoms from long time history of opiate dependence.  Patient had episodes of vomiting stating he felt " sick" and is having significant nausea  and vomiting related to opiate withdrawal.  He was given PRN meds with good effect.  He was ordered Mirtazapine for depression and to help with sleep.  Patient states he feels "  sick" and is having significant nausea and vomiting related to opiate withdrawal.    Gregory Price's moods gradually improved and at time of discharge, he rated both depression and anxiety levels to be manageable and minimal.  Denies physiological concerns/SI/HI/AVH at time of discharge.  He requested to be has satisfactory support network and home environment and will adhere to medication compliance and outpatient treatment.  To note, patient had requested to be tested for HIV and Hep, both had negative results.  Consults:  psychiatry  Significant Diagnostic Studies:  labs: Per ED, positive UDS cocaine and opiates.  Discharge Vitals:   Blood pressure 119/63, pulse 61, temperature 98 F (36.7 C), temperature source Oral, resp. rate 17, height 5\' 5"  (1.651 m), weight 55.339 kg (122 lb), SpO2 98 %. Body mass index is 20.3 kg/(m^2). Lab Results:   No results found for this or any previous visit (from the past 72 hour(s)).  Physical Findings: AIMS:  , ,  ,  ,    CIWA:    COWS:     Psychiatric Specialty Exam: See Psychiatric Specialty Exam and Suicide Risk Assessment completed by Attending Physician prior to discharge.  Discharge destination:  Home  Is patient on multiple antipsychotic therapies at discharge:  No   Has Patient had three or more failed trials of antipsychotic monotherapy by history:  No  Recommended Plan for Multiple Antipsychotic Therapies: NA      Discharge Instructions    Diet - low sodium heart healthy    Complete by:  As directed      Increase activity slowly    Complete by:  As directed             Medication List    TAKE these medications      Indication   hydrALAZINE 10 MG tablet  Commonly known as:  APRESOLINE  Take 1 tablet (10 mg total) by mouth 3 (three) times daily.   Indication:  High Blood Pressure     mirtazapine 7.5 MG tablet  Commonly known as:  REMERON  Take 1 tablet (7.5 mg total) by mouth at bedtime.   Indication:  Trouble Sleeping, Major  Depressive Disorder       Follow-up recommendations:  Activity:  as tol, diet as tol  Comments:  1.  Take all your medications as prescribed.              2.  Report any adverse side effects to outpatient provider.                       3.  Patient instructed to not use alcohol or illegal drugs while on prescription medicines.            4.  In the event of worsening symptoms, instructed patient to call 911, the crisis hotline or go to nearest emergency room for evaluation of symptoms.  Total Discharge Time:  Greater than 30 minutes.  SignedKerrie Buffalo MAY, AGNP-NP 09/28/2014, 9:33 AM  Patient seen, Suicide Assessment Completed.  Disposition Plan Reviewed

## 2014-09-28 MED ORDER — MIRTAZAPINE 7.5 MG PO TABS: 7.5000 mg | ORAL_TABLET | Freq: Every day | ORAL | Status: DC

## 2014-09-28 MED ORDER — HYDRALAZINE HCL 10 MG PO TABS: 10.0000 mg | ORAL_TABLET | Freq: Three times a day (TID) | ORAL | Status: DC

## 2014-10-02 NOTE — Discharge Summary (Addendum)
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 Psychiatric: He has a normal mood and affect. His behavior is normal. Judgment and thought content normal.    Review of Systems  Constitutional: Negative.   HENT: Negative.   Eyes: Negative.   Respiratory: Negative.   Gastrointestinal: Negative.   Genitourinary: Negative.   Musculoskeletal: Negative.   Skin: Negative.   Neurological: Negative.   Endo/Heme/Allergies: Negative.   Psychiatric/Behavioral: Positive for depression. Negative for suicidal ideas, hallucinations, memory loss and substance abuse (Hx of, chronic, stabilized). The patient is nervous/anxious. The patient does not have insomnia.     Blood pressure 156/74, pulse 52, temperature 99 F (37.2 C), temperature source Oral, resp. rate 18, height 5\' 5"  (1.651 m), weight 55.7 kg (122 lb 12.7 oz), SpO2 100 %.Body mass index is 20.43 kg/(m^2).   Musculoskeletal: Strength & Muscle Tone: within normal limits- at this time no gross tremors or diaphoresis noted. Patient does not appear to be in any acute distress. Gait &  Station: normal Patient leans: N/A  Past Psychiatric History: Diagnosis: States he has been diagnosed with Depression, and describes history of Opiate Dependence. Denies history of psychosis or mania.  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 Lung cancer     resolved in 2011  . Hepatitis C   . COPD (chronic obstructive pulmonary disease)    AXIS IV: economic problems, housing problems, occupational problems and problems related to social environment AXIS V: 41-50 serious symptoms  Level of Care:  OP  Hospital Course:  Patient is A 63 year old man, who reports history of opiate ( IV Heroin) dependence and depression.  Additionally, he describes significant neuro-vegetative symptoms of depression.  He has been using " 100 dollars a day" over recent weeks to months, and last used two days ago.  He states " I am tired of this- I need detox".  He presented to ER requesting detoxification and describing depression and suicidal ideations. He states he has been fantasizing about walking in front of a car.  When he presented to the ED, patient denied active suicidal ideations and contracts for safety on the unit.  He endorsed active opiate withdrawal symptoms such as chills, nausea, some muscle aches, rhinorrhea, watery eyes. He  expresses concern that " the worst has not started yet".  Patient was admitted to [facility] for further management of his crisis event.  He was also monitored for safety and any withdrawal symptoms from long time history of opiate  dependence.  Patient had episodes of vomiting stating he felt " sick" and is having significant nausea and vomiting related to opiate withdrawal.  He was given PRN meds with good effect.  He was ordered Mirtazapine for depression and to help with sleep.  Patient states he feels " sick" and is having significant nausea and vomiting related to opiate withdrawal.    Diem's moods gradually improved and at time of discharge, he rated both depression and anxiety levels to be manageable and minimal.  Denies physiological concerns/SI/HI/AVH at time of discharge.  He requested to be has satisfactory support network and home environment and will adhere to medication compliance and outpatient treatment.  To note, patient had requested to be tested for HIV and Hep, both had negative results.  Consults:  psychiatry  Significant Diagnostic Studies:  labs: Per ED, positive UDS cocaine and opiates.  Discharge Vitals:   Blood pressure 156/74, pulse 52, temperature 99 F (37.2 C), temperature source Oral, resp. rate 18, height 5\' 5"  (1.651 m), weight 55.7 kg (122 lb 12.7 oz), SpO2 100 %. Body mass index is 20.43 kg/(m^2). Lab Results:   No results found for this or any previous visit (from the past 72 hour(s)).  Physical Findings: AIMS: Facial and Oral Movements Muscles of Facial Expression: None, normal Lips and Perioral Area: None, normal Jaw: None, normal Tongue: None, normal,Extremity Movements Upper (arms, wrists, hands, fingers): None, normal Lower (legs, knees, ankles, toes): None, normal, Trunk Movements Neck, shoulders, hips: None, normal, Overall Severity Severity of abnormal movements (highest score from questions above): None, normal Incapacitation due to abnormal movements: None, normal Patient's awareness of abnormal movements (rate only patient's report): No Awareness, Dental Status Current problems with teeth and/or dentures?: No (Pt does not have any teeth.) Does patient usually wear  dentures?: No  CIWA:  CIWA-Ar Total: 8 COWS:  COWS Total Score: 10  Psychiatric Specialty Exam: See Psychiatric Specialty Exam and Suicide Risk Assessment completed by Attending Physician prior to discharge.  Discharge destination:  Home  Is patient on multiple antipsychotic therapies at discharge:  No   Has Patient had three or more failed trials of antipsychotic monotherapy by history:  No  Recommended Plan for Multiple Antipsychotic Therapies: NA     Medication List    Notice    You have not been prescribed any medications.     Follow-up recommendations:  Activity:  as tol, diet as tol  Comments:  1.  Take all your medications as prescribed.              2.  Report any adverse side effects to outpatient provider.                       3.  Patient instructed to not use alcohol or illegal drugs while on prescription medicines.            4.  In the event of worsening symptoms, instructed patient to call 911, the crisis hotline or go to nearest emergency room for evaluation of symptoms.  Total Discharge Time:  Greater than 30 minutes.  SignedNeita Garnet MD 10/02/2014, 11:14 AM   Milana Huntsman MD

## 2014-11-19 NOTE — H&P (Signed)
Expand All Collapse All    History and Physical  Georges Victorio HFW:[id number] DOB: 1951/06/13 DOA: 08/09/2014  Referring physician: Vivi Martens, ER physician PCP: Patient goes to the [facility]   Chief Complaint: Nausea and vomiting  HPI: Gregory Price is a 64 y.o. male  Who is homeless with past medical history of COPD, hepatitis and opiate abuse who is homeless who presented to the emergency room on 10/30 with thoughts of suicide and was admitted to the behavioral health service. At that time, he told Behavioral Health that he wanted to detox from heroin. Patient started having repeated nausea and vomiting, uncontrolled and was then sent over to the emergency room today, 10/31 for further evaluation. Despite attempts with medications for nausea control, patient's vomiting was persisting. It was felt the patient would likely need to stay in the hospital for at least a day with IV medication intervention. Hospitalist will call for further evaluation.  In the emergency room, lab work was done noting a mildly elevated leukocytosis of 13 which was normal yesterday. An abdominal CT was done which noted constipation throughout and some incidental findings of hepatic steatosis, renal cysts and incidental adenomas.   Review of Systems:  Patient seen in emergency room. Pt complains of continued nausea and vomiting, abdominal cramping and weakness. He suffers from chronic constipation  Pt denies any headaches, vision changes, dysphagia, chest pain, palpitations, shortness of breath, wheeze, cough, hematuria, dysuria, diarrhea, focal extremity numbness or weakness or pain. Review of systems are otherwise negative  Past Medical History  Diagnosis Date  . Lung cancer     resolved in 2011  . Hepatitis C   . COPD (chronic obstructive pulmonary disease)    Past Surgical History  Procedure Laterality Date  . Abdominal surgery      2008  . Lung surgery      2011    . Appendectomy    . Knee surgery      right knee, 1967   Social History: reports that he has been smoking Cigarettes. He has a 15 pack-year smoking history. He does not have any smokeless tobacco history on file. He reports that he uses illicit drugs (Heroin). He reports that he does not drink alcohol. Patient is homeless, but is able to participate in activities of daily living without assistance.  No Known Allergies  Family history: Discussed with patient and he states that no medical problems run in his family   Prior to Admission medications   Not on File    Physical Exam: BP 175/81  Pulse 53  Temp(Src) 98.4 F (36.9 C) (Oral)  Resp 23  Ht 5\' 3"  (1.6 m)  Wt 58.06 kg (128 lb)  BMI 22.68 kg/m2  SpO2 100%   Gen.: Alert and oriented 3, mild distress secondary to nausea and vomiting  Eyes:Sclera nonicteric, extraocular movements are intact  HEENT: Normocephalic, atraumatic, mucous members are dry  neck: Supple, no JVD  cardiovascular: Regular rate and rhythm, S1-S2  Respiratory: Clear to auscultation bilaterally  Abdomen: Soft, nontender, nondistended, hyperactive bowel sounds  Skin:Tanned skin, no skin breaks tears or lesions  Musculoskeletal: No clubbing or cyanosis or edema  psychiatric: patient is appropriate, no evidence of psychoses. Currently he does not express suicidal ideation  neurologic: No overt deficits          Labs on Admission:  Basic Metabolic Panel:  Last Labs      Recent Labs Lab 08/07/14 1117 08/09/14 1302  NA 135* 141  K 4.2  3.8  CL 101 98  CO2 25 27  GLUCOSE 104* 128*  BUN 9 20  CREATININE 0.72 0.92  CALCIUM 8.8 9.9     Liver Function Tests:  Last Labs      Recent Labs Lab 08/07/14 1117 08/09/14 1302  AST 19 48*  ALT 20 24  ALKPHOS 84 92  BILITOT 0.5 1.5*  PROT 6.6 8.0  ALBUMIN 3.6 4.2      Last Labs      Recent Labs Lab  08/09/14 1302  LIPASE 17      Last Labs     No results found for this basename: AMMONIA, in the last 168 hours   CBC:  Last Labs      Recent Labs Lab 08/07/14 1117 08/09/14 1302  WBC 6.2 13.2*  NEUTROABS --  10.8*  HGB 13.3 16.6  HCT 40.2 48.0  MCV 88.9 86.5  PLT 208 288     Cardiac Enzymes:  Last Labs     No results found for this basename: CKTOTAL, CKMB, CKMBINDEX, TROPONINI, in the last 168 hours    BNP (last 3 results)  Recent Labs (within last 365 days)    No results found for this basename: PROBNP, in the last 8760 hours   CBG:  Last Labs     No results found for this basename: GLUCAP, in the last 168 hours    Radiological Exams on Admission:  Imaging Results (Last 48 hours)    Ct Abdomen Pelvis W Contrast  08/09/2014 CLINICAL DATA: Nausea and vomiting ; opiate withdrawal; leukocytosis EXAM: CT ABDOMEN AND PELVIS WITH CONTRAST TECHNIQUE: Multidetector CT imaging of the abdomen and pelvis was performed using the standard protocol following bolus administration of intravenous contrast. CONTRAST: 177mL OMNIPAQUE IOHEXOL 300 MG/ML SOLN COMPARISON: October 06, 2005 FINDINGS: There is mild bibasilar lung scarring, more on the right than on the left. There is no edema or consolidation in the lung bases. There is hepatic steatosis. There is a linear area of enhancement in the medial segment of the left lobe of the liver consistent with a benign vascular shunt, a finding that is felt to have been present on the previous study of 0 slightly better seen at this time. No focal liver lesions beyond this benign-appearing vascular shunt seen. Gallbladder wall is not thickened. There is no appreciable biliary duct dilatation. Spleen and pancreas appear normal. There is a stable benign apparent adenoma in the medial right adrenal measuring 1.2 x 0.9 cm. There is a benign adenoma in the left adrenal measuring 1.0 x 1.0 cm. Adrenals otherwise  appear normal bilaterally. There is a simple cyst in the upper pole of the left kidney measuring 2.6 x 2.2 cm. There is a 9 x 9 mm simple cyst in the posterior upper pole left kidney. There is a simple cyst in the lower pole of the right kidney measuring 1.6 x 1.5 cm. No non cystic renal masses are identified on either side. There is no hydronephrosis on either side. There is no renal or ureteral calculus on either side. In the pelvis, there is moderate generalized urinary bladder wall thickening consistent with a degree of cystitis. There is no pelvic mass or pelvic fluid collection. Appendix is absent. There is stool throughout much of the colon. There is no bowel obstruction. No free air or portal venous air. There is currently no significant bowel wall thickening. There is no ascites, adenopathy, or abscess in the abdomen or pelvis. There is atherosclerotic change in the  aorta and iliac arteries without appreciable aneurysm. There is no major mesenteric arterial narrowing. There is degenerative change in the lumbar spine. There are no blastic or lytic bone lesions. IMPRESSION: Urinary bladder wall thickening consistent with a degree of cystitis. No bowel obstruction. No abscess. Appendix absent.1 no appreciable bowel wall thickening. Question a degree of constipation. Hepatic steatosis. 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 Small adrenal adenomas, benign findings, present. Small cysts in each kidney. Electronically Signed By: Lowella Grip M.D. On: 08/09/2014 14:39     EKG: Independently revnormal sinus rhythm, borderline shortened PR interval, borderline right axis deviation   Assessment/Plan Present on Admission:  . Opiate withdrawal-presenting as persistent nausea and vomiting: Despite mild leukocytosis, do not think that this is infectious. Treat supportively with IV fluids, IV Phenergan and Zofran. Treat constipation.   Elevated blood pressures: Secondary to  opiate withdrawal. Avoiding clonidine given patient's mild bradycardia. We'll use hydralazine scheduled plus when necessary IV hydralazine.   History of COPD: Stable.   History of hepatitis C: Stable.   . Nausea & vomiting . Depression with suicidal ideation: Stable. Since he is suicidal ideation, will continue sitter. [facility] will manage  . Hepatic steatosis: Incidentally noted on CT.  Marland Kitchen Constipation due to opioid therapy: Seen on CT. Likely secondary to chronic opiate abuse. Started on Colace plus daily Mira lax. If no large bowel movement by tomorrow morning, could try something stronger.  . Leukocytosis: Likely stress margination. No signs of infection. Recheck tomorrow.  . Tobacco abuse: Nicotine patch  Consultants: None  CODE STATUS: Full code  Family Communication: patient declined for me to call family  Disposition Plan: Monitor overnight, hopefully able to discharge tomorrow back to behavioral health  Time spent:35 minutes   [facility] Pager [phone]

## 2015-01-07 ENCOUNTER — Encounter (HOSPITAL_COMMUNITY): Payer: Self-pay | Admitting: Emergency Medicine

## 2015-01-07 ENCOUNTER — Emergency Department (HOSPITAL_COMMUNITY)
Admission: EM | Admit: 2015-01-07 | Discharge: 2015-01-08 | Disposition: A | Discharge status: Home / Self Care | Payer: Non-veteran care | Attending: Emergency Medicine | Admitting: Emergency Medicine

## 2015-01-07 DIAGNOSIS — F1110 Opioid abuse, uncomplicated: Secondary | ICD-10-CM | POA: Insufficient documentation

## 2015-01-07 DIAGNOSIS — Y9389 Activity, other specified: Secondary | ICD-10-CM | POA: Insufficient documentation

## 2015-01-07 DIAGNOSIS — Z79899 Other long term (current) drug therapy: Secondary | ICD-10-CM | POA: Insufficient documentation

## 2015-01-07 DIAGNOSIS — S50851A Superficial foreign body of right forearm, initial encounter: Secondary | ICD-10-CM | POA: Insufficient documentation

## 2015-01-07 DIAGNOSIS — J449 Chronic obstructive pulmonary disease, unspecified: Secondary | ICD-10-CM | POA: Insufficient documentation

## 2015-01-07 DIAGNOSIS — Z720 Tobacco use: Secondary | ICD-10-CM | POA: Insufficient documentation

## 2015-01-07 DIAGNOSIS — Y9289 Other specified places as the place of occurrence of the external cause: Secondary | ICD-10-CM | POA: Insufficient documentation

## 2015-01-07 DIAGNOSIS — Y998 Other external cause status: Secondary | ICD-10-CM | POA: Insufficient documentation

## 2015-01-07 DIAGNOSIS — Z8619 Personal history of other infectious and parasitic diseases: Secondary | ICD-10-CM | POA: Insufficient documentation

## 2015-01-07 DIAGNOSIS — W458XXA Other foreign body or object entering through skin, initial encounter: Secondary | ICD-10-CM | POA: Insufficient documentation

## 2015-01-07 DIAGNOSIS — Z85118 Personal history of other malignant neoplasm of bronchus and lung: Secondary | ICD-10-CM | POA: Insufficient documentation

## 2015-01-07 NOTE — ED Notes (Signed)
Pt states he was shooting up and a needle broke off in his right forearm

## 2015-01-08 ENCOUNTER — Emergency Department (HOSPITAL_COMMUNITY): Payer: Non-veteran care

## 2015-01-08 NOTE — ED Provider Notes (Signed)
CSN: [id number]     Arrival date & time 01/07/15  2254 History   First MD Initiated Contact with Patient 01/07/15 2306     Chief Complaint  Patient presents with  . Foreign Body in Skin     (Consider location/radiation/quality/duration/timing/severity/associated sxs/prior Treatment) HPI  Patient reports he has been abusing heroin for the past 10 years. He states he was shooting up in his right forearm couple hours ago in the needle broke off in his arm. He has chronic shortness of breath but denies having shortness of breath now. He denies chest pain.  PCP [facility] in [place]  Past Medical History  Diagnosis Date  . Lung cancer     resolved in 2011  . Hepatitis C   . COPD (chronic obstructive pulmonary disease)    Past Surgical History  Procedure Laterality Date  . Abdominal surgery      2008  . Lung surgery      2011  . Appendectomy    . Knee surgery      right knee, 1967   Family History  Problem Relation Age of Onset  . Cancer Other    History  Substance Use Topics  . Smoking status: Current Every Day Smoker -- 1.00 packs/day for 15 years    Types: Cigarettes  . Smokeless tobacco: Not on file  . Alcohol Use: No     Comment: former  sober from ETOH for 12 years  Doing heroin for 10 years  Review of Systems  All other systems reviewed and are negative.     Allergies  Review of patient's allergies indicates no known allergies.  Home Medications   Prior to Admission medications   Medication Sig Start Date End Date Taking? Authorizing Provider  hydrALAZINE (APRESOLINE) 10 MG tablet Take 1 tablet (10 mg total) by mouth 3 (three) times daily. Patient not taking: Reported on 01/07/2015 09/28/14   Kerrie Buffalo, NP  mirtazapine (REMERON) 7.5 MG tablet Take 1 tablet (7.5 mg total) by mouth at bedtime. Patient not taking: Reported on 01/07/2015 09/28/14   Kerrie Buffalo, NP   BP 139/66 mmHg  Pulse 68  Temp(Src) 98.5 F (36.9 C) (Oral)  Resp 18  Ht 5\' 6"  (1.676  m)  Wt 145 lb (65.772 kg)  BMI 23.41 kg/m2  SpO2 100%  Vital signs normal   Physical Exam  Constitutional: He is oriented to person, place, and time. He appears well-developed and well-nourished.  Non-toxic appearance. He does not appear ill. No distress.  HENT:  Head: Normocephalic and atraumatic.  Right Ear: External ear normal.  Left Ear: External ear normal.  Nose: Nose normal. No mucosal edema or rhinorrhea.  Mouth/Throat: Oropharynx is clear and moist and mucous membranes are normal. No dental abscesses or uvula swelling.  Eyes: Conjunctivae and EOM are normal. Pupils are equal, round, and reactive to light.  Neck: Normal range of motion and full passive range of motion without pain. Neck supple.  Pulmonary/Chest: Effort normal. No respiratory distress. He has no rhonchi. He exhibits no crepitus.  Abdominal: Normal appearance.  Musculoskeletal: Normal range of motion. He exhibits no edema or tenderness.  Moves all extremities well.  Patient has marked on his right forearm. He shows me an area in his right forearm where he states the needle broke off. When I palpate it there is no obvious foreign body. He has no pain on ROM.  Neurological: He is alert and oriented to person, place, and time. He has normal strength. No cranial  nerve deficit.  Skin: Skin is warm, dry and intact. No rash noted. No erythema. No pallor.  Psychiatric: He has a normal mood and affect. His speech is normal and behavior is normal. His mood appears not anxious.  Nursing note and vitals reviewed.   ED Course  Procedures (including critical care time)   01:50 pt discussed with Dr Grandville Silos who was here in the ED seeing another patient. He can see the patient in the office to schedule OR time for removal. Pt is going to check with the [facility] and decide if he is going to go to the [facility] or stay local.    Labs Review Labs Reviewed - No data to display  Imaging Review Dg Forearm Right  01/08/2015   CLINICAL  DATA:  Foreign body in skin soft tissue. Broken needle, possibly in mid arm.  EXAM: RIGHT FOREARM - 2 VIEW  COMPARISON:  None.  FINDINGS: 12 mm linear foreign body consistent needle is seen in the mid proximal soft tissues of the forearm, about the volar aspect. No associated soft tissue air. There radius and ulna are intact.  IMPRESSION: Linear 12 mm foreign body consistent with needle in the volar forearm, approximately 6 mm distal to the antecubital fossa.   Electronically Signed   By: Jeb Levering M.D.   On: 01/08/2015 01:19     EKG Interpretation None      MDM   patient given instructions to return to the ED such as increasing redness, pain, swelling, or drainage of pus.    Final diagnoses:  Foreign body in forearm, right, initial encounter  Heroin abuse    Plan discharge  Rolland Porter, MD, Barbette Or, MD 01/08/15 [phone]

## 2015-01-08 NOTE — Discharge Instructions (Signed)
You can call Dr Biagio Borg office to get an appointment to discuss removal of the needle. Return to the ED if you get pain, redness, swelling, drain pus, see a red streak running up your arm. You can also follow up with the [facility] in [place] about this.

## 2015-10-16 ENCOUNTER — Emergency Department (HOSPITAL_COMMUNITY)
Admission: EM | Admit: 2015-10-16 | Discharge: 2015-10-16 | Disposition: A | Discharge status: Other Acute Inpatient Hospital | Payer: Non-veteran care | Attending: Emergency Medicine | Admitting: Emergency Medicine

## 2015-10-16 ENCOUNTER — Inpatient Hospital Stay (HOSPITAL_COMMUNITY)
Admission: AD | Admit: 2015-10-16 | Discharge: 2015-10-26 | DRG: 897 | Disposition: A | Discharge status: Other Acute Inpatient Hospital | Payer: No Typology Code available for payment source | Source: Intra-hospital | Attending: Psychiatry | Admitting: Psychiatry

## 2015-10-16 ENCOUNTER — Encounter (HOSPITAL_COMMUNITY): Payer: Self-pay | Admitting: *Deleted

## 2015-10-16 DIAGNOSIS — R112 Nausea with vomiting, unspecified: Secondary | ICD-10-CM | POA: Diagnosis present

## 2015-10-16 DIAGNOSIS — F1910 Other psychoactive substance abuse, uncomplicated: Secondary | ICD-10-CM | POA: Diagnosis present

## 2015-10-16 DIAGNOSIS — Z85118 Personal history of other malignant neoplasm of bronchus and lung: Secondary | ICD-10-CM | POA: Diagnosis not present

## 2015-10-16 DIAGNOSIS — F1120 Opioid dependence, uncomplicated: Secondary | ICD-10-CM | POA: Diagnosis present

## 2015-10-16 DIAGNOSIS — F1123 Opioid dependence with withdrawal: Secondary | ICD-10-CM | POA: Insufficient documentation

## 2015-10-16 DIAGNOSIS — F17210 Nicotine dependence, cigarettes, uncomplicated: Secondary | ICD-10-CM | POA: Insufficient documentation

## 2015-10-16 DIAGNOSIS — F1193 Opioid use, unspecified with withdrawal: Secondary | ICD-10-CM

## 2015-10-16 DIAGNOSIS — Z8619 Personal history of other infectious and parasitic diseases: Secondary | ICD-10-CM | POA: Diagnosis not present

## 2015-10-16 DIAGNOSIS — F1110 Opioid abuse, uncomplicated: Secondary | ICD-10-CM | POA: Diagnosis present

## 2015-10-16 DIAGNOSIS — F332 Major depressive disorder, recurrent severe without psychotic features: Secondary | ICD-10-CM | POA: Diagnosis present

## 2015-10-16 DIAGNOSIS — F1994 Other psychoactive substance use, unspecified with psychoactive substance-induced mood disorder: Secondary | ICD-10-CM | POA: Diagnosis present

## 2015-10-16 DIAGNOSIS — F1410 Cocaine abuse, uncomplicated: Secondary | ICD-10-CM | POA: Insufficient documentation

## 2015-10-16 DIAGNOSIS — R531 Weakness: Secondary | ICD-10-CM | POA: Insufficient documentation

## 2015-10-16 DIAGNOSIS — Z5900 Homelessness unspecified: Secondary | ICD-10-CM

## 2015-10-16 DIAGNOSIS — F1124 Opioid dependence with opioid-induced mood disorder: Secondary | ICD-10-CM | POA: Diagnosis present

## 2015-10-16 DIAGNOSIS — J449 Chronic obstructive pulmonary disease, unspecified: Secondary | ICD-10-CM | POA: Diagnosis not present

## 2015-10-16 DIAGNOSIS — R45851 Suicidal ideations: Secondary | ICD-10-CM | POA: Diagnosis present

## 2015-10-16 LAB — COMPREHENSIVE METABOLIC PANEL
ALBUMIN: 3.3 g/dL — AB (ref 3.5–5.0)
ALK PHOS: 78 U/L (ref 38–126)
ALT: 20 U/L (ref 17–63)
ANION GAP: 15 (ref 5–15)
AST: 34 U/L (ref 15–41)
BUN: 11 mg/dL (ref 6–20)
CHLORIDE: 103 mmol/L (ref 101–111)
CO2: 21 mmol/L — AB (ref 22–32)
CREATININE: 0.77 mg/dL (ref 0.61–1.24)
Calcium: 9.3 mg/dL (ref 8.9–10.3)
GFR calc non Af Amer: >60 mL/min (ref >60)
Glucose, Bld: 98 mg/dL (ref 65–99)
Potassium: 4.9 mmol/L (ref 3.5–5.1)
Sodium: 139 mmol/L (ref 135–145)
Total Bilirubin: 1.7 mg/dL — ABNORMAL HIGH (ref 0.3–1.2)
Total Protein: 7 g/dL (ref 6.5–8.1)

## 2015-10-16 LAB — CBC
HCT: 43.3 % (ref 39.0–52.0)
HEMOGLOBIN: 14.7 g/dL (ref 13.0–17.0)
MCH: 29.5 pg (ref 26.0–34.0)
MCHC: 33.9 g/dL (ref 30.0–36.0)
MCV: 86.8 fL (ref 78.0–100.0)
Platelets: 426 10*3/uL — ABNORMAL HIGH (ref 150–400)
RBC: 4.99 MIL/uL (ref 4.22–5.81)
RDW: 12.6 % (ref 11.5–15.5)
WBC: 10.4 10*3/uL (ref 4.0–10.5)

## 2015-10-16 LAB — RAPID URINE DRUG SCREEN, HOSP PERFORMED
Amphetamines: NOT DETECTED
BARBITURATES: NOT DETECTED
Benzodiazepines: NOT DETECTED
Cocaine: POSITIVE — AB
Opiates: POSITIVE — AB
Tetrahydrocannabinol: NOT DETECTED

## 2015-10-16 LAB — ETHANOL: Alcohol, Ethyl (B): <5 mg/dL (ref <5)

## 2015-10-16 LAB — SALICYLATE LEVEL

## 2015-10-16 LAB — ACETAMINOPHEN LEVEL

## 2015-10-16 MED ORDER — ONDANSETRON 4 MG PO TBDP
4.0000 mg | ORAL_TABLET | Freq: Once | ORAL | Status: AC
  Administered 2015-10-16: 4 mg via ORAL

## 2015-10-16 NOTE — ED Notes (Signed)
Pt instructed to provide urine specimen.

## 2015-10-16 NOTE — ED Provider Notes (Addendum)
CSN: [id number]     Arrival date & time 10/16/15  1525 History   First MD Initiated Contact with Patient 10/16/15 1736     Chief Complaint  Patient presents with  . Withdrawal     (Consider location/radiation/quality/duration/timing/severity/associated sxs/prior Treatment) Patient is a 65 y.o. male presenting with drug/alcohol assessment. The history is provided by the patient.  Drug / Alcohol Assessment Similar prior episodes: yes   Severity:  Moderate Onset quality:  Gradual Timing:  Constant Progression:  Unchanged Chronicity:  Recurrent Suspected agents:  Heroin Associated symptoms: somnolence and weakness   Associated symptoms: no abdominal pain, no hallucinations and no shortness of breath   Risk factors comment:  Homelessness   Past Medical History  Diagnosis Date  . Lung cancer ([place])     resolved in 2011  . Hepatitis C   . COPD (chronic obstructive pulmonary disease) [facility])    Past Surgical History  Procedure Laterality Date  . Abdominal surgery      2008  . Lung surgery      2011  . Appendectomy    . Knee surgery      right knee, 1967   Family History  Problem Relation Age of Onset  . Cancer Other    Social History  Substance Use Topics  . Smoking status: Current Every Day Smoker -- 1.00 packs/day for 15 years    Types: Cigarettes  . Smokeless tobacco: None  . Alcohol Use: No     Comment: former    Review of Systems  Respiratory: Negative for shortness of breath.   Gastrointestinal: Negative for abdominal pain.  Neurological: Positive for weakness.  Psychiatric/Behavioral: Negative for hallucinations.  All other systems reviewed and are negative.     Allergies  Review of patient's allergies indicates no known allergies.  Home Medications   Prior to Admission medications   Medication Sig Start Date End Date Taking? Authorizing Provider  hydrALAZINE (APRESOLINE) 10 MG tablet Take 1 tablet (10 mg total) by mouth 3 (three) times  daily. Patient not taking: Reported on 01/07/2015 09/28/14   Kerrie Buffalo, NP  mirtazapine (REMERON) 7.5 MG tablet Take 1 tablet (7.5 mg total) by mouth at bedtime. Patient not taking: Reported on 01/07/2015 09/28/14   Kerrie Buffalo, NP   BP 129/90 mmHg  Pulse 97  Temp(Src) 99 F (37.2 C) (Oral)  Resp 18  SpO2 100% Physical Exam  Constitutional: He is oriented to person, place, and time. He appears well-developed. He appears cachectic. No distress.  Disheveled, homeless appearing  HENT:  Head: Normocephalic and atraumatic.  Eyes: Conjunctivae are normal.  Neck: Neck supple. No tracheal deviation present.  Cardiovascular: Normal rate and regular rhythm.   Pulmonary/Chest: Effort normal. No respiratory distress.  Abdominal: Soft. He exhibits no distension.  Neurological: He is alert and oriented to person, place, and time.  Skin: Skin is warm and dry.  Psychiatric: He has a normal mood and affect.    ED Course  Procedures (including critical care time) Labs Review Labs Reviewed - No data to display  Imaging Review No results found. I have personally reviewed and evaluated these images and lab results as part of my medical decision-making.   EKG Interpretation None      MDM   Final diagnoses:  Opiate withdrawal ([place])    65 year old male presents appearing older than stated age inquiring about inpatient rehabilitation for heroin abuse. Last use was yesterday. No suicidal or homicidal ideation currently. No indication for acute psychiatric stabilization. Resources  provided for establishing outpatient therapy.    Leo Grosser, MD 10/16/15 1827  After patient was being discharged he endorsed suicidality and called the suicide hotline from the emergency department. He has a plan to jump off a bridge into traffic. It is possible the patient is malingering but he is high risk due to homelessness status and drug abuse. TTS consulted to evaluate for safety. The patient has  no active medical complaints. MEDICALLY CLEAR FOR TRANSFER OR PSYCHIATRIC ADMISSION.   Leo Grosser, MD 10/16/15 [phone]

## 2015-10-16 NOTE — ED Notes (Signed)
Pt was given bus pass to get to shelter.

## 2015-10-16 NOTE — ED Notes (Signed)
Pt ambulated to RR .  

## 2015-10-16 NOTE — ED Notes (Signed)
Patient states when he tries to drink he vomits.  Patient then proceeded to drink approx 220m of fluid and then vomited 4037m  Notified by NT at bedside

## 2015-10-16 NOTE — ED Notes (Signed)
Pt on TTS assessment at this time

## 2015-10-16 NOTE — ED Notes (Signed)
Pt moved back to podc, [facility] at [facility] notified that pt is ready for telepsych. All pt's valuables collected and pt being wanded by security.

## 2015-10-16 NOTE — ED Notes (Signed)
Phlebotomy at bedside collecting blood

## 2015-10-16 NOTE — ED Notes (Signed)
Attempted to call [facility].  Unable to pick up patient this evening.

## 2015-10-16 NOTE — ED Notes (Signed)
Pt escorted to [facility] via [facility] security. All belongings taken with patient.

## 2015-10-16 NOTE — ED Notes (Signed)
Pt reports withdrawing from heroine, last used yesterday. Appears in no acute distress at triage. Denies SI or HI.

## 2015-10-16 NOTE — BH Assessment (Addendum)
Tele Assessment Note   Gregory Price is an 65 y.o. male, widowed, Caucasian who presents unaccompanied to [facility] requesting treatment for heroin use and also reporting suicidal ideation. Pt states he is seeking treatment at this time "because I am tired of it." He reports using heroin for the past eight years. He states he uses 3-6 bags of heroin intravenously daily. He denies alcohol or other substance use, urine drug screen is pending. He states his last use was one day ago and he has a history of withdrawal symptoms including nausea, vomiting, diarrhea, cramps and fatigue. He states his longest period of clean time is two years.   Pt reports his mood is depressed with symptoms including crying spells, social withdrawal, loss of interest in usual pleasures, decreased concentration, decreased sleep, decreased appetite and feelings of guilt and hopelessness. When he was evaluated by the EDP today he denied suicidal ideation but when he was prepared for discharge he called a suicide hotline. He currently reports suicidal ideation with a plan to jump from an overpass into traffic. Pt denies any history of suicide attempts or para-suicidal behavior. Pt denies any family history of suicide attempts, mental health problems or substance abuse problems. Pt denies homicidal ideation or history of violence. Pt denies any history of psychotic symptoms.  Pt reports he is currently homeless. He cannot identify any support but he does have two adult daughters. He is unemployed. He states he has veteran's benefits. Pt denies any current legal problems. He reports he last received treatment for substance abuse at the [facility] in [place] approximately two years ago. He denies any current outpatient providers. He denies any current prescription medications.  Pt is dressed in hospital scrubs, alert, oriented x4 with normal speech and normal motor behavior. Eye contact is fair. Pt's mood is depressed and affect is  depressed and irritable. Thought process is coherent and relevant. There is no indication Pt is currently responding to internal stimuli or experiencing delusional thought content. Pt was generally cooperative and gave brief responses to all questions. He will agree to voluntary inpatient treatment.   Diagnosis: Opioid Use Disorder, Severe; Substance-induced Mood Disorder  Past Medical History:  Past Medical History  Diagnosis Date  . Lung cancer ([place])     resolved in 2011  . Hepatitis C   . COPD (chronic obstructive pulmonary disease) [facility])     Past Surgical History  Procedure Laterality Date  . Abdominal surgery      2008  . Lung surgery      2011  . Appendectomy    . Knee surgery      right knee, 1967    Family History:  Family History  Problem Relation Age of Onset  . Cancer Other     Social History:  reports that he has been smoking Cigarettes.  He has a 15 pack-year smoking history. He does not have any smokeless tobacco history on file. He reports that he uses illicit drugs (Heroin). He reports that he does not drink alcohol.  Additional Social History:  Alcohol / Drug Use Pain Medications: Denies abuse Prescriptions: Denies use Over the Counter: Denies abuse History of alcohol / drug use?: Yes Longest period of sobriety (when/how long): Two years Negative Consequences of Use: Financial, Personal relationships, Work / School Withdrawal Symptoms: Sweats, Weakness, Diarrhea, Nausea / Vomiting, Cramps Substance #1 Name of Substance 1: Heroin (I.V.) 1 - Age of First Use: 56 1 - Amount (size/oz): 3-6 bags 1 - Frequency:  daily 1 - Duration: Ongoing 1 - Last Use / Amount: 10/15/15, 3 bags  CIWA: CIWA-Ar BP: 129/90 mmHg Pulse Rate: 97 COWS: Clinical Opiate Withdrawal Scale (COWS) Resting Pulse Rate: Pulse Rate 101-120 Sweating: Subjective report of chills or flushing Restlessness: Able to sit still Pupil Size: Pupils pinned or normal size for room light Bone or  Joint Aches: Mild diffuse discomfort Runny Nose or Tearing: Nasal stuffiness or unusually moist eyes GI Upset: No GI symptoms Tremor: No tremor Yawning: No yawning Anxiety or Irritability: Patient reports increasing irritability or anxiousness Gooseflesh Skin: Skin is smooth COWS Total Score: 6  PATIENT STRENGTHS: (choose at least two) Ability for insight Average or above average intelligence Capable of independent living Communication skills General fund of knowledge Motivation for treatment/growth Physical Health  Allergies: No Known Allergies  Home Medications:  (Not in a hospital admission)  OB/GYN Status:  No LMP for male patient.  General Assessment Data Location of Assessment: [facility] ED TTS Assessment: In system Is this a Tele or Face-to-Face Assessment?: Tele Assessment Is this an Initial Assessment or a Re-assessment for this encounter?: Initial Assessment Marital status: Widowed [place] City name: NA Is patient pregnant?: No Pregnancy Status: No Living Arrangements: Other (Comment) (Homeless) Can pt return to current living arrangement?: Yes Admission Status: Voluntary Is patient capable of signing voluntary admission?: Yes Referral Source: Self/Family/Friend Insurance type: VA benefits     Crisis Care Plan Living Arrangements: Other (Comment) (Homeless) Legal Guardian: Other: (None) Name of Psychiatrist: None Name of Therapist: None  Education Status Is patient currently in school?: No Current Grade: NA Highest grade of school patient has completed: 12 Name of school: NA Contact person: NA  Risk to self with the past 6 months Suicidal Ideation: Yes-Currently Present Has patient been a risk to self within the past 6 months prior to admission? : Yes Suicidal Intent: Yes-Currently Present Has patient had any suicidal intent within the past 6 months prior to admission? : Yes Is patient at risk for suicide?: Yes Suicidal Plan?: Yes-Currently Present Has  patient had any suicidal plan within the past 6 months prior to admission? : Yes Specify Current Suicidal Plan: Plan to jump off overpass into traffic Access to Means: Yes Specify Access to Suicidal Means: Knows of overpass to jump from What has been your use of drugs/alcohol within the last 12 months?: Pt using heroin daily Previous Attempts/Gestures: No How many times?: 0 Other Self Harm Risks: None Triggers for Past Attempts: None known Intentional Self Injurious Behavior: None Family Suicide History: No Recent stressful life event(s): Other (Comment), Financial Problems (Homeless) Persecutory voices/beliefs?: No Depression: Yes Depression Symptoms: Despondent, Tearfulness, Isolating, Fatigue, Guilt, Loss of interest in usual pleasures, Feeling worthless/self pity Substance abuse history and/or treatment for substance abuse?: Yes Suicide prevention information given to non-admitted patients: Not applicable  Risk to Others within the past 6 months Homicidal Ideation: No Does patient have any lifetime risk of violence toward others beyond the six months prior to admission? : No Thoughts of Harm to Others: No Current Homicidal Intent: No Current Homicidal Plan: No Access to Homicidal Means: No Identified Victim: None History of harm to others?: No Assessment of Violence: None Noted Violent Behavior Description: Pt denies history of violence Does patient have access to weapons?: No Criminal Charges Pending?: No Does patient have a court date: No Is patient on probation?: No  Psychosis Hallucinations: None noted Delusions: None noted  Mental Status Report Appearance/Hygiene: In scrubs Eye Contact: Fair Motor Activity: Unremarkable Speech: Logical/coherent  Level of Consciousness: Alert Mood: Depressed Affect: Depressed, Irritable Anxiety Level: None Thought Processes: Coherent, Relevant Judgement: Unimpaired Orientation: Person, Place, Time, Situation, Appropriate for  developmental age Obsessive Compulsive Thoughts/Behaviors: None  Cognitive Functioning Concentration: Normal Memory: Recent Intact, Remote Intact IQ: Average Insight: Good Impulse Control: Good Appetite: Poor Weight Loss: 0 Weight Gain: 0 Sleep: No Change Total Hours of Sleep: 6 Vegetative Symptoms: None  ADLScreening [facility] Assessment Services) Patient's cognitive ability adequate to safely complete daily activities?: Yes Patient able to express need for assistance with ADLs?: Yes Independently performs ADLs?: Yes (appropriate for developmental age)  Prior Inpatient Therapy Prior Inpatient Therapy: Yes Prior Therapy Dates: 2014 Prior Therapy Facilty/Provider(s): [facility] Reason for Treatment: Substance abuse  Prior Outpatient Therapy Prior Outpatient Therapy: No Prior Therapy Dates: NA Prior Therapy Facilty/Provider(s): NA Reason for Treatment: NA Does patient have an ACCT team?: No Does patient have Intensive In-House Services?  : No Does patient have [facility] services? : No Does patient have P4CC services?: No  ADL Screening (condition at time of admission) Patient's cognitive ability adequate to safely complete daily activities?: Yes Is the patient deaf or have difficulty hearing?: No Does the patient have difficulty seeing, even when wearing glasses/contacts?: No Does the patient have difficulty concentrating, remembering, or making decisions?: No Patient able to express need for assistance with ADLs?: Yes Does the patient have difficulty dressing or bathing?: No Independently performs ADLs?: Yes (appropriate for developmental age) Does the patient have difficulty walking or climbing stairs?: No Weakness of Legs: None Weakness of Arms/Hands: None  Home Assistive Devices/Equipment Home Assistive Devices/Equipment: None    Abuse/Neglect Assessment (Assessment to be complete while patient is alone) Physical Abuse: Denies Verbal Abuse: Denies Sexual Abuse:  Denies Exploitation of patient/patient's resources: Denies Self-Neglect: Denies     Regulatory affairs officer (For Healthcare) Does patient have an advance directive?: No Would patient like information on creating an advanced directive?: No - patient declined information    Additional Information 1:1 In Past 12 Months?: No CIRT Risk: No Elopement Risk: No Does patient have medical clearance?: Yes     Disposition: Lavell Luster, AC at [facility], who confirmed bed availability. Gave clinical report to Arlester Marker, NP who accepted Pt to [facility] Observation Unit under the service of Dr. Hampton Abbot after urine drug screen is collected. Notified Leo Grosser, MD and Raquel Sarna, RN of acceptance.  Disposition Initial Assessment Completed for this Encounter: Yes Disposition of Patient: Other dispositions Other disposition(s): Other (Comment)   Evelena Peat, [facility], [place], [facility] Triage Specialist [phone]   Anson Fret, Orpah Greek 10/16/2015 8:19 PM

## 2015-10-16 NOTE — ED Notes (Signed)
Ptg was up for discharged and now states that he is suicidal and has plan to jump off of a bridge. MD talked with pt and consulting TTS. This RN is getting pt's belongings together, staffing office and notified as well as charge Health visitor.

## 2015-10-16 NOTE — Discharge Instructions (Signed)
Opioid Withdrawal Opioids are a group of narcotic drugs. They include the street drug heroin. They also include pain medicines, such as morphine, hydrocodone, oxycodone, and fentanyl. Opioid withdrawal is a group of characteristic physical and mental signs and symptoms. It typically occurs if you have been using opioids daily for several weeks or longer and stop using or rapidly decrease use. Opioid withdrawal can also occur if you have used opioids daily for a long time and are given a medicine to block the effect.  SIGNS AND SYMPTOMS Opioid withdrawal includes three or more of the following symptoms:   Depressed, anxious, or irritable mood.  Nausea or vomiting.  Muscle aches or spasms.   Watery eyes.   Runny nose.  Dilated pupils, sweating, or hairs standing on end.  Diarrhea or intestinal cramping.  Yawning.   Fever.  Increased blood pressure.  Fast pulse.  Restlessness or trouble sleeping. These signs and symptoms occur within several hours of stopping or reducing short-acting opioids, such as heroin. They can occur within 3 days of stopping or reducing long-acting opioids, such as methadone. Withdrawal begins within minutes of receiving a drug that blocks the effects of opioids, such as naltrexone or naloxone. DIAGNOSIS  Opioid use disorder is diagnosed by your health care provider. You will be asked about your symptoms, drug and alcohol use, medical history, and use of medicines. A physical exam may be done. Lab tests may be ordered. Your health care provider may have you see a mental health professional.  TREATMENT  The treatment for opioid withdrawal is usually provided by medical doctors with special training in substance use disorders (addiction specialists). The following medicines may be included in treatment:  Opioids given in place of the abused opioid. They turn on opioid receptors in the brain and lessen or prevent withdrawal symptoms. They are gradually  decreased (opioid substitution and taper).  Non-opioids that can lessen certain opioid withdrawal symptoms. They may be used alone or with opioid substitution and taper. Successful long-term recovery usually requires medicine, counseling, and group support. HOME CARE INSTRUCTIONS   Take medicines only as directed by your health care provider.  Check with your health care provider before starting new medicines.  Keep all follow-up visits as directed by your health care provider. SEEK MEDICAL CARE IF:  You are not able to take your medicines as directed.  Your symptoms get worse.  You relapse. SEEK IMMEDIATE MEDICAL CARE IF:  You have serious thoughts about hurting yourself or others.  You have a seizure.  You lose consciousness.   This information is not intended to replace advice given to you by your health care provider. Make sure you discuss any questions you have with your health care provider.   Document Released: 09/29/2003 Document Revised: 10/17/2014 Document Reviewed: 10/09/2013 Elsevier Interactive Patient Education 2016 [employer].   Emergency Department Resource Guide 1) Find a Doctor and Pay Out of Pocket Although you won't have to find out who is covered by your insurance plan, it is a good idea to ask around and get recommendations. You will then need to call the office and see if the doctor you have chosen will accept you as a new patient and what types of options they offer for patients who are self-pay. Some doctors offer discounts or will set up payment plans for their patients who do not have insurance, but you will need to ask so you aren't surprised when you get to your appointment.  2) Contact Your Local Health  Department Not all health departments have doctors that can see patients for sick visits, but many do, so it is worth a call to see if yours does. If you don't know where your local health department is, you can check in your phone book. The CDC  also has a tool to help you locate your state's health department, and many state websites also have listings of all of their local health departments.  3) Find a [facility] If your illness is not likely to be very severe or complicated, you may want to try a walk in clinic. These are popping up all over the country in pharmacies, drugstores, and shopping centers. They're usually staffed by nurse practitioners or physician assistants that have been trained to treat common illnesses and complaints. They're usually fairly quick and inexpensive. However, if you have serious medical issues or chronic medical problems, these are probably not your best option.  No Primary Care Doctor: - Call Health Connect at  [phone] - they can help you locate a primary care doctor that  accepts your insurance, provides certain services, etc. - Physician Referral Service- [phone]  Chronic Pain Problems: Organization         Address  Phone   Notes  [facility]  [phone] Patients need to be referred by their primary care doctor.   Medication Assistance: Organization         Address  Phone   Notes  [facility] Medication [facility] [address] [place], [place] [address] [phone] --Must be a resident of [facility] -- Must have NO insurance coverage whatsoever (no Medicaid/ Medicare, etc.) -- The pt. MUST have a primary care doctor that directs their care regularly and follows them in the community   MedAssist  [phone]   [employer]  [phone]    Agencies that provide inexpensive medical care: Organization         Address  Phone   Notes  [place]  [phone]   [facility]    [phone]   [facility] [place],  [address] [phone]   [place] [address], [place] [phone]   Planned Parenthood    [phone]   [facility]    [phone]   [place] [address], [place] Phone:  [phone], Fax:  [phone] Hours of Operation:  9 am - 6 pm, M-F.  Also accepts Medicaid/Medicare and self-pay.  [facility] for [place], [address], [place] Phone: [phone], Fax: [phone]. Hours of Operation:  8:30 am - 5:30 pm, M-F.  Also accepts Medicaid and self-pay.  [facility] [place] [address] [place] Phone: [phone]   [place], [place], [place] [phone], Ext. [phone] Mondays & Thursdays: 7-9 AM.  First 15 patients are seen on a first come, first serve basis.    [place] Providers:  Organization         Address  Phone   Notes  [facility] [address], [place] [phone] Also accepts self-pay patients.  [place], [place]  [phone]   [place], [place], [place] [phone]  [place] [address], [place] [phone]   Lucianne Lei [address], [place]   [phone] Only accepts [place] Access [place] patients after they have their name applied to their card.   Self-Pay (no insurance) in [facility]:  Organization         Address  Phone   Notes  Sickle Cell Patients, [facility] [place] [phone]   [facility] [place] [phone]   [facility] [place]  [place], [place], [place] [phone]   [facility]/Dr. Osei-Bonsu  [address], [place] or [address], [place] [phone] Phone number for both [place] and [place] locations is the same.  [facility] [address], [place] [phone]   [facility] [address], [place] or [address] [phone]   [facility] [address], [place] [phone], phone; [phone], fax Sees patients 1st and 3rd Saturday of every month.  Must not qualify for public or private insurance (i.e. Medicaid, Medicare, [place] Health Choice, Veterans' Benefits)  Household income should be no more than 200% of the poverty level The clinic cannot treat you if you are pregnant or think you are pregnant  Sexually transmitted diseases are not treated at the clinic.    Dental Care: Organization         Address  Phone  Notes  [facility] of [facility] [place] [phone] Accepts children up to age 35 who are enrolled in [place] or [place]; pregnant women with a Medicaid card; and children who have applied for Medicaid or [place] Health Choice, but were declined, whose parents can pay a reduced fee at time of service.  [facility] Department of [facility]  [address], [place] [phone] Accepts children up to age 110 who are enrolled in [place] or [place]; pregnant women with a Medicaid card; and children who have applied for Medicaid or [place] Health Choice, but were declined, whose parents can pay a reduced fee at time of service.  [place] Adult [facility] Access PROGRAM  [place] [phone] Patients are seen by appointment only. Walk-ins are not accepted. [place] will see patients 73 years of age and older. Monday - Tuesday (8am-5pm) Most Wednesdays (8:30-5pm) $30 per visit, cash only  [facility] Adult Dental Access PROGRAM  [address], [facility] [phone] Patients are seen by appointment only. Walk-ins are not accepted. [place] will see patients 67 years of age and older. One  Wednesday Evening (Monthly: Volunteer Based).  $30 per visit, cash only  [facility] [place]  [phone] for adults; Children under age 54, call Graduate Pediatric Dentistry at [phone]. Children aged 30-14, please call [phone] to request a pediatric application.  Dental services are provided in all areas of dental care including fillings, crowns and bridges, complete and partial dentures, implants, gum treatment, root canals, and extractions. Preventive care is also provided. Treatment is provided to both adults and children. Patients are selected via a lottery and there is often a waiting list.   [facility] [address] [place]  [phone] [email or web address]  [facility] [address] [place], [place] [phone], Ext. [phone] Second and Fourth Thursday of each month, opens at 6:30 AM; Clinic ends at 9 AM.  Patients are seen on a first-come first-served basis, and a limited number are seen during each clinic.   [facility]  [address] Hillard Danker [place], [place] [phone]   Eligibility Requirements You must have lived in [place], [place], or [place] counties for at least the last three months.   You cannot be eligible for state or federal sponsored [employer], including [employer], [place], or [employer].   You generally cannot be eligible for healthcare insurance through your employer.    How to apply: Eligibility screenings are held every Tuesday and Wednesday afternoon from 1:00 pm until 4:00 pm. You do not need an appointment for the interview!  [facility] [address] [place], [place]   [place]  [place] Department  [place]  [phone]    Behavioral Health Resources in the Community: Intensive Outpatient Programs Organization          Address  Phone  Notes  [place]. [address] [place], [place] [phone]   [facility] [address] [place], [place]   [facility] [address], [place], [place]   [place] [address],  [place], [place] or [phone]   Substance Abuse Resources Organization         Address  Phone  Notes  Al[facility] Drug Services  [phone]   [place]  [phone]   [facility] [place]   Chinita Pester  [phone]   Residential & Outpatient Substance Abuse Program  [phone]   Psychological Services Organization         Address  Phone  Notes  [facility] [place]  [place]  [phone]   [place] [address], [place] or [phone]    Mobile Crisis Teams Organization         Address  Phone  Notes  [facility], Mobile Crisis Care Unit  [phone]   [facility]  [address] [place], [place]   [place] [address] [place] [phone]    Self-Help/Support Groups Organization         Address  Phone             Notes  [place]. of [place] - variety of support groups  [place] Call for more information  Narcotics Anonymous (NA), [facility] [address], [employer]   2 meetings at this location   Special educational needs teacher         Address  Phone  Notes  [facility] [place],    [place]  [phone]   [facility]  [address] [place] [phone], [place], [place]   [place], [place] [phone] Admissions: 8am-3pm M-F  Incentives Substance [place] [address] [place] [address],    [place], [place] [phone]   [facility] [address] Jadene Pierini [place], [place]   [facility] [address]  [place], [place]   [facility] Programs - Intensive Outpatient [place]  [address] Kristeen [address], [place], [place]   [facility] ([place] [address]  [place], [place] [phone] or [phone]   [facility] (RTS) [address], [place], [place] Accepts Medicaid  [facility] [place] [address] [place] [address],  [place] [phone] Substance Abuse/Addiction Treatment   [facility] Organization         Address  Phone  Notes  [facility]  [phone]   Domenic Schwab, PhD [address] Arlis Porta [place], [place]   [phone] or [phone]   [place], [place] [phone]   [place], [place], [place] [phone] Insurance/Medicaid/sponsorship through [facility] and Families [address], [address]                                    [place], [place] [phone] [place] [address], [place] [phone]    Dr. Adele Schilder  [phone]   [facility] Dept. 1) [address] [place] 2) [place] 3)  [place] [address], [place] [phone]  [phone]   [place] [phone] or [phone] (After Hours)

## 2015-10-17 ENCOUNTER — Encounter (HOSPITAL_COMMUNITY): Payer: Self-pay | Admitting: *Deleted

## 2015-10-17 DIAGNOSIS — F1994 Other psychoactive substance use, unspecified with psychoactive substance-induced mood disorder: Secondary | ICD-10-CM | POA: Diagnosis not present

## 2015-10-17 DIAGNOSIS — F332 Major depressive disorder, recurrent severe without psychotic features: Secondary | ICD-10-CM | POA: Diagnosis present

## 2015-10-17 DIAGNOSIS — F1124 Opioid dependence with opioid-induced mood disorder: Secondary | ICD-10-CM | POA: Diagnosis present

## 2015-10-17 DIAGNOSIS — R45851 Suicidal ideations: Secondary | ICD-10-CM

## 2015-10-17 DIAGNOSIS — F17210 Nicotine dependence, cigarettes, uncomplicated: Secondary | ICD-10-CM | POA: Diagnosis present

## 2015-10-17 DIAGNOSIS — F1110 Opioid abuse, uncomplicated: Secondary | ICD-10-CM | POA: Diagnosis not present

## 2015-10-17 DIAGNOSIS — F1123 Opioid dependence with withdrawal: Secondary | ICD-10-CM | POA: Diagnosis present

## 2015-10-17 DIAGNOSIS — J449 Chronic obstructive pulmonary disease, unspecified: Secondary | ICD-10-CM | POA: Diagnosis present

## 2015-10-17 DIAGNOSIS — Z85118 Personal history of other malignant neoplasm of bronchus and lung: Secondary | ICD-10-CM | POA: Diagnosis not present

## 2015-10-17 DIAGNOSIS — F1910 Other psychoactive substance abuse, uncomplicated: Secondary | ICD-10-CM | POA: Diagnosis present

## 2015-10-17 DIAGNOSIS — Z590 Homelessness: Secondary | ICD-10-CM | POA: Diagnosis not present

## 2015-10-17 MED ORDER — CLONIDINE HCL 0.1 MG PO TABS
0.1000 mg | ORAL_TABLET | Freq: Four times a day (QID) | ORAL | Status: AC
  Administered 2015-10-17 – 2015-10-18 (×6): 0.1 mg via ORAL
  Filled 2015-10-17 (×10): qty 1

## 2015-10-17 MED ORDER — NICOTINE 21 MG/24HR TD PT24
21.0000 mg | MEDICATED_PATCH | Freq: Every day | TRANSDERMAL | Status: DC
  Administered 2015-10-18 – 2015-10-26 (×9): 21 mg via TRANSDERMAL
  Filled 2015-10-17 (×13): qty 1

## 2015-10-17 MED ORDER — ACETAMINOPHEN 325 MG PO TABS
650.0000 mg | ORAL_TABLET | Freq: Four times a day (QID) | ORAL | Status: DC | PRN
  Administered 2015-10-21 – 2015-10-26 (×4): 650 mg via ORAL
  Filled 2015-10-17 (×4): qty 2

## 2015-10-17 MED ORDER — HYDROXYZINE HCL 25 MG PO TABS
25.0000 mg | ORAL_TABLET | Freq: Four times a day (QID) | ORAL | Status: DC | PRN
  Administered 2015-10-17 (×2): 25 mg via ORAL
  Filled 2015-10-17 (×2): qty 1

## 2015-10-17 MED ORDER — ONDANSETRON 4 MG PO TBDP
4.0000 mg | ORAL_TABLET | Freq: Four times a day (QID) | ORAL | Status: AC | PRN
  Administered 2015-10-18 – 2015-10-21 (×4): 4 mg via ORAL
  Filled 2015-10-17 (×4): qty 1

## 2015-10-17 MED ORDER — DICYCLOMINE HCL 20 MG PO TABS
20.0000 mg | ORAL_TABLET | Freq: Four times a day (QID) | ORAL | Status: AC | PRN
  Administered 2015-10-19: 20 mg via ORAL
  Filled 2015-10-17 (×2): qty 1

## 2015-10-17 MED ORDER — MAGNESIUM HYDROXIDE 400 MG/5ML PO SUSP
30.0000 mL | Freq: Every day | ORAL | Status: DC | PRN
Start: 2015-10-17 — End: 2015-10-26

## 2015-10-17 MED ORDER — ONDANSETRON 4 MG PO TBDP
4.0000 mg | ORAL_TABLET | Freq: Four times a day (QID) | ORAL | Status: DC | PRN
  Administered 2015-10-17: 4 mg via ORAL
  Filled 2015-10-17: qty 1

## 2015-10-17 MED ORDER — ALUM & MAG HYDROXIDE-SIMETH 200-200-20 MG/5ML PO SUSP: 30.0000 mL | ORAL | Status: DC | PRN

## 2015-10-17 MED ORDER — CLONIDINE HCL 0.1 MG PO TABS
0.1000 mg | ORAL_TABLET | Freq: Every day | ORAL | Status: AC
  Administered 2015-10-21 – 2015-10-22 (×2): 0.1 mg via ORAL
  Filled 2015-10-17 (×2): qty 1

## 2015-10-17 MED ORDER — LOPERAMIDE HCL 2 MG PO CAPS: 2.0000 mg | ORAL_CAPSULE | ORAL | Status: DC | PRN

## 2015-10-17 MED ORDER — DICYCLOMINE HCL 20 MG PO TABS: 20.0000 mg | ORAL_TABLET | Freq: Four times a day (QID) | ORAL | Status: DC | PRN

## 2015-10-17 MED ORDER — HYDROXYZINE HCL 25 MG PO TABS: 25.0000 mg | ORAL_TABLET | Freq: Four times a day (QID) | ORAL | Status: AC | PRN

## 2015-10-17 MED ORDER — NAPROXEN 500 MG PO TABS
500.0000 mg | ORAL_TABLET | Freq: Two times a day (BID) | ORAL | Status: AC | PRN
  Filled 2015-10-17: qty 1

## 2015-10-17 MED ORDER — LOPERAMIDE HCL 2 MG PO CAPS: 2.0000 mg | ORAL_CAPSULE | ORAL | Status: AC | PRN

## 2015-10-17 MED ORDER — NAPROXEN 500 MG PO TABS
500.0000 mg | ORAL_TABLET | Freq: Two times a day (BID) | ORAL | Status: DC | PRN
  Administered 2015-10-17: 500 mg via ORAL
  Filled 2015-10-17: qty 1

## 2015-10-17 MED ORDER — CLONIDINE HCL 0.1 MG PO TABS
0.1000 mg | ORAL_TABLET | ORAL | Status: AC
  Administered 2015-10-19 – 2015-10-20 (×4): 0.1 mg via ORAL
  Filled 2015-10-17 (×5): qty 1

## 2015-10-17 MED ORDER — METHOCARBAMOL 500 MG PO TABS
500.0000 mg | ORAL_TABLET | Freq: Three times a day (TID) | ORAL | Status: DC | PRN
Start: 2015-10-17 — End: 2015-10-17

## 2015-10-17 MED ORDER — METHOCARBAMOL 500 MG PO TABS: 500.0000 mg | ORAL_TABLET | Freq: Three times a day (TID) | ORAL | Status: AC | PRN

## 2015-10-17 NOTE — H&P (Signed)
Psychiatric Admission Assessment Adult  Patient Identification: Gregory Price MRN:  [id number] Date of Evaluation:  10/17/2015 Chief Complaint:  opioid use disorder substance induced mood disorder Principal Diagnosis: <principal problem not specified> Diagnosis:   Patient Active Problem List   Diagnosis Date Noted  . Opiate withdrawal ([place]) [F11.23] 08/09/2014  . Homeless [Z59.0] 08/09/2014  . Nausea & vomiting [R11.2] 08/09/2014  . Hepatic steatosis [K76.0] 08/09/2014  . Constipation due to opioid therapy [K59.03, T40.2X5A] 08/09/2014  . Hx of appendectomy [Z90.49] 08/09/2014  . Bilateral renal cysts [Q61.02] 08/09/2014  . Leukocytosis [D72.829] 08/09/2014  . Tobacco abuse [Z72.0] 08/09/2014  . History of hepatitis C [Z86.19] 08/09/2014  . History of COPD [Z87.09] 08/09/2014  . Opioid withdrawal ([place]) [F11.23] 08/09/2014  . Elevated blood pressure [R03.0] 08/09/2014  . Depression [F32.9] 08/07/2014  . Heroin abuse [F11.10] 08/07/2014  . Substance induced mood disorder ([place]) [F19.94] 08/07/2014  . Suicidal ideation [R45.851] 08/07/2014   History of Present Illness:: Gregory Price is an 65 y.o. male, widowed, Caucasian who presents unaccompanied to [facility] requesting treatment for heroin use and also reporting suicidal ideation. Pt states he is seeking treatment at this time "because I am tired of it." He reports using heroin for the past eight years. He states he uses 3-6 bags of heroin intravenously daily. He denies alcohol or other substance use, urine drug screen is pending. He states his last use was one day ago and he has a history of withdrawal symptoms including nausea, vomiting, diarrhea, cramps and fatigue. He states his longest period of clean time is two years.   Pt reports his mood is depressed with symptoms including crying spells, social withdrawal, loss of interest in usual pleasures, decreased concentration, decreased sleep, decreased appetite and feelings of guilt  and hopelessness. When he was evaluated by the EDP today he denied suicidal ideation but when he was prepared for discharge he called a suicide hotline. He currently reports suicidal ideation with a plan to jump from an overpass into traffic. Pt denies any history of suicide attempts or para-suicidal behavior. Pt denies any family history of suicide attempts, mental health problems or substance abuse problems. Pt denies homicidal ideation or history of violence. Pt denies any history of psychotic symptoms.  Pt reports he is currently homeless. He cannot identify any support but he does have two adult daughters. He is unemployed. He states he has veteran's benefits. Pt denies any current legal problems. He reports he last received treatment for substance abuse at the [facility] in [place] approximately two years ago. He denies any current outpatient providers. He denies any current prescription medications.  Pt is dressed in hospital scrubs, alert, oriented x4 with normal speech and normal motor behavior. Eye contact is fair. Pt's mood is depressed and affect is depressed and irritable. Thought process is coherent and relevant. There is no indication Pt is currently responding to internal stimuli or experiencing delusional thought content. Pt was generally cooperative and gave brief responses to all questions. He will agree to voluntary inpatient treatment.  Obs Unit:  Gregory Price is interviewed and chart reviewed.  Patient continues to be alert, oriented x4, to person, place, time and situation.  His speech is soft, coherent and low tone, eye contact fair and avoiding. Patient 's mood and affect is depressed and irritable, thought process is coherent and relevant. Patient with complaint of suicidal ideation, stating "I want to kill myself' but for a plan states "I don't know".  He denies SI/HI/AVH.  There is no indication Patient is responding to internal stimuli.   Patient is also requesting heroin detox.     "I started out with pain medication and when couldn't afford it or get it any more I started the heroin about 5 years ago.  Patient states that he needs to get his life together and get off of the heroin.   Patient is currently having some  withdrawal symptoms. He complains of nausea, vomiting, muscle cramping and general malise. Patient is unsteady on his feet.  Patient denies seizure activity.  Patient reports he feels depressed & had medication management at [facility] [place] in the past but not receiving any current treatment. Patient is not taking any psychiatric medications.   Patient endorses depressive symptoms of: crying spells, social withdrawal, loss of interest in usual pleasures, decreased concentration, decreased sleep, decreased appetite and feelings of guilt and hopelessness. Patient states that he has been in detox once 2 years ago in [place] and was clean for 2 months.  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  Patient's stressors are not being able to control his drug use, not having a place to live.  Patient is homeless.  Patient has two adult daughters who do not want anything to do with him. Pt denies any family history of suicide attempts, mental health problems or substance abuse problems  Associated Signs/Symptoms: Depression Symptoms:  depressed mood, anhedonia, psychomotor retardation, feelings of worthlessness/guilt, difficulty concentrating, hopelessness, suicidal thoughts without plan, loss of energy/fatigue, disturbed sleep, decreased appetite, (Hypo) Manic Symptoms:  None reported Anxiety Symptoms:  None reported  Psychotic Symptoms:  None reported PTSD Symptoms: None reported Total Time spent with patient: 30 minutes  Past Psychiatric History: See HPI  Risk to Self:   Risk to Others:   Prior Inpatient Therapy:   Prior Outpatient Therapy:    Alcohol Screening:   Substance Abuse History in the last 12 months:  Yes.   Consequences of Substance Abuse: Medical  Consequences:  None reported Legal Consequences:  None reported Family Consequences:  Reports that his adult daughters do not want anything to do with him Previous Psychotropic Medications: Yes  Psychological Evaluations: No  Past Medical History:  Past Medical History  Diagnosis Date  . Lung cancer ([place])     resolved in 2011  . Hepatitis C   . COPD (chronic obstructive pulmonary disease) [facility])     Past Surgical History  Procedure Laterality Date  . Abdominal surgery      2008  . Lung surgery      2011  . Appendectomy    . Knee surgery      right knee, 1967   Family History:  Family History  Problem Relation Age of Onset  . Cancer Other    Family Psychiatric  History: See HPI Social History:  History  Alcohol Use No    Comment: former     History  Drug Use  . Yes  . Special: Heroin    Comment: heroin    Social History   Social History  . Marital Status: Legally Separated    Spouse Name: N/A  . Number of Children: N/A  . Years of Education: N/A   Social History Main Topics  . Smoking status: Current Every Day Smoker -- 1.00 packs/day for 15 years    Types: Cigarettes  . Smokeless tobacco: Not on file  . Alcohol Use: No     Comment: former  . Drug Use: Yes    Special: Heroin  Comment: heroin  . Sexual Activity: Not on file   Other Topics Concern  . Not on file   Social History Narrative   Additional Social History:                         Allergies:  No Known Allergies Lab Results:  Results for orders placed or performed during the hospital encounter of 10/16/15 (from the past 48 hour(s))  Urine rapid drug screen (hosp performed)     Status: Abnormal   Collection Time: 10/16/15  9:24 PM  Result Value Ref Range   Opiates POSITIVE (A) NONE DETECTED   Cocaine POSITIVE (A) NONE DETECTED   Benzodiazepines NONE DETECTED NONE DETECTED   Amphetamines NONE DETECTED NONE DETECTED   Tetrahydrocannabinol NONE DETECTED NONE DETECTED    Barbiturates NONE DETECTED NONE DETECTED    Comment:        DRUG SCREEN FOR MEDICAL PURPOSES ONLY.  IF CONFIRMATION IS NEEDED FOR ANY PURPOSE, NOTIFY LAB WITHIN 5 DAYS.        LOWEST DETECTABLE LIMITS FOR URINE DRUG SCREEN Drug Class       Cutoff (ng/mL) Amphetamine      1000 Barbiturate      200 Benzodiazepine   767 Tricyclics       341 Opiates          300 Cocaine          300 THC              50   Comprehensive metabolic panel     Status: Abnormal   Collection Time: 10/16/15  9:28 PM  Result Value Ref Range   Sodium 139 135 - 145 mmol/L   Potassium 4.9 3.5 - 5.1 mmol/L    Comment: SPECIMEN HEMOLYZED. HEMOLYSIS MAY AFFECT INTEGRITY OF RESULTS.   Chloride 103 101 - 111 mmol/L   CO2 21 (L) 22 - 32 mmol/L   Glucose, Bld 98 65 - 99 mg/dL   BUN 11 6 - 20 mg/dL   Creatinine, Ser 0.77 0.61 - 1.24 mg/dL   Calcium 9.3 8.9 - 10.3 mg/dL   Total Protein 7.0 6.5 - 8.1 g/dL   Albumin 3.3 (L) 3.5 - 5.0 g/dL   AST 34 15 - 41 U/L   ALT 20 17 - 63 U/L   Alkaline Phosphatase 78 38 - 126 U/L   Total Bilirubin 1.7 (H) 0.3 - 1.2 mg/dL   GFR calc non Af Amer >60 >60 mL/min   GFR calc Af Amer >60 >60 mL/min    Comment: (NOTE) The eGFR has been calculated using the CKD EPI equation. This calculation has not been validated in all clinical situations. eGFR's persistently <60 mL/min signify possible Chronic Kidney Disease.    Anion gap 15 5 - 15  Ethanol (ETOH)     Status: None   Collection Time: 10/16/15  9:28 PM  Result Value Ref Range   Alcohol, Ethyl (B) <5 <5 mg/dL    Comment:        LOWEST DETECTABLE LIMIT FOR SERUM ALCOHOL IS 5 mg/dL FOR MEDICAL PURPOSES ONLY   Salicylate level     Status: None   Collection Time: 10/16/15  9:28 PM  Result Value Ref Range   Salicylate Lvl <9.3 2.8 - 30.0 mg/dL  Acetaminophen level     Status: Abnormal   Collection Time: 10/16/15  9:28 PM  Result Value Ref Range   Acetaminophen (Tylenol), Serum <10 (L) 10 - 30  ug/mL    Comment:         THERAPEUTIC CONCENTRATIONS VARY SIGNIFICANTLY. A RANGE OF 10-30 ug/mL MAY BE AN EFFECTIVE CONCENTRATION FOR MANY PATIENTS. HOWEVER, SOME ARE BEST TREATED AT CONCENTRATIONS OUTSIDE THIS RANGE. ACETAMINOPHEN CONCENTRATIONS >150 ug/mL AT 4 HOURS AFTER INGESTION AND >50 ug/mL AT 12 HOURS AFTER INGESTION ARE OFTEN ASSOCIATED WITH TOXIC REACTIONS.   CBC     Status: Abnormal   Collection Time: 10/16/15  9:28 PM  Result Value Ref Range   WBC 10.4 4.0 - 10.5 K/uL   RBC 4.99 4.22 - 5.81 MIL/uL   Hemoglobin 14.7 13.0 - 17.0 g/dL   HCT 43.3 39.0 - 52.0 %   MCV 86.8 78.0 - 100.0 fL   MCH 29.5 26.0 - 34.0 pg   MCHC 33.9 30.0 - 36.0 g/dL   RDW 12.6 11.5 - 15.5 %   Platelets 426 (H) 150 - 725 K/uL    Metabolic Disorder Labs:  No results found for: HGBA1C, MPG No results found for: PROLACTIN No results found for: CHOL, TRIG, HDL, CHOLHDL, VLDL, LDLCALC  Current Medications: No current facility-administered medications for this encounter.   PTA Medications: Prescriptions prior to admission  Medication Sig Dispense Refill Last Dose  . hydrALAZINE (APRESOLINE) 10 MG tablet Take 1 tablet (10 mg total) by mouth 3 (three) times daily. (Patient not taking: Reported on 01/07/2015) 90 tablet 1 Not Taking at Unknown time  . mirtazapine (REMERON) 7.5 MG tablet Take 1 tablet (7.5 mg total) by mouth at bedtime. (Patient not taking: Reported on 01/07/2015) 30 tablet 1 Not Taking at Unknown time    Musculoskeletal: Strength & Muscle Tone: decreased Gait & Station: unsteady Patient leans: Right  Psychiatric Specialty Exam: Physical Exam  ROS  Blood pressure 185/68, pulse 54, temperature 97.8 F (36.6 C), temperature source Oral, resp. rate 18, height _0  (1.651 m), weight 65.772 kg (145 lb), SpO2 100 %.Body mass index is 24.13 kg/(m^2).  General Appearance: Disheveled  Eye Contact::  Minimal  Speech:  Clear and Coherent and Slow  Volume:  Decreased  Mood:  Depressed and Hopeless  Affect:   Congruent and Depressed  Thought Process:  Goal Directed, Linear and Logical  Orientation:  Full (Time, Place, and Person)  Thought Content:  WDL  Suicidal Thoughts:  Yes.  without intent/plan  Homicidal Thoughts:  No  Memory:  Immediate;   Good Recent;   Good Remote;   Good  Judgement:  Fair  Insight:  Fair  Psychomotor Activity:  Decreased  Concentration:  Good  Recall:  [place] of Knowledge:Fair  Language: Good  Akathisia:  Negative  Handed:  Right  AIMS (if indicated):     Assets:  Communication Skills Desire for Improvement Physical Health Resilience  ADL's:  Intact  Cognition: WNL  Sleep:        Observation Level/Precautions:  Continuous Observation  Laboratory:  Per EDP  Psychotherapy:  For sobriety  Medications:  Per med list  Consultations:    Discharge Concerns:  Withdrawal symptoms  Estimated LOS:  Other:     Treatment Plan Summary: Daily contact with patient to assess and evaluate symptoms and progress in treatment, Medication management. Report any adverse side effects to outpatient provider Plan to be admitted inpatient, meets psychiatric inpatient criteria  Disposal: Admit to psychiatric inpatient to [facility] when bed becomes available.   Samantha Crimes, PMHNP-BC 1/7/201712:02 AM

## 2015-10-17 NOTE — Progress Notes (Signed)
[facility] INPATIENT:  Family/Significant Other Suicide Prevention Education  Suicide Prevention Education:  Patient Refusal for Family/Significant Other Suicide Prevention Education: The patient Gregory Price has refused to provide written consent for family/significant other to be provided Family/Significant Other Suicide Prevention Education during admission and/or prior to discharge.  Physician notified.  Loyal Gambler B 10/17/2015, 12:52 AM

## 2015-10-17 NOTE — Progress Notes (Signed)
Nurse Assessment Note:  Patient is mostly sleeping but fairly easily awakens. Patient is slightly irritable and admits to being so. Patient states Gregory Price has had no relief from the Vistaril given him at approximately 0645. Patient does admit that Gregory Price has SI "off and on" but currently denies any; states Gregory Price has no specific plan when Gregory Price does have SI. Patient also denies any HI/AVH. Nurse providing continuous observation except when patient in bathroom; nurse also providing emotional support and therapeutic communication. Patient remains safe on Unit and verbally contracts for safety in that Gregory Price agrees to alert staff should Gregory Price develop andy SI with a plan.

## 2015-10-17 NOTE — Progress Notes (Signed)
Patient ID: Gregory Price, male   DOB: 02/28/1951, 65 y.o.   MRN: [id number] Initial Interdisciplinary Treatment Plan   PATIENT STRESSORS: Financial difficulties Health problems Substance abuse   PATIENT STRENGTHS: Average or above average intelligence Communication skills Motivation for treatment/growth   PROBLEM LIST: Problem List/Patient Goals Date to be addressed Date deferred Reason deferred Estimated date of resolution  "I want to detox" - heroin abuse 10/17/2015      "I would jump off a bridge" - SI 10/17/2015     "I can't get help" - depression 10/17/2015     "I don't have any where to live" - homelessness 10/17/2015                                    DISCHARGE CRITERIA:  Ability to meet basic life and health needs Improved stabilization in mood, thinking, and/or behavior Motivation to continue treatment in a less acute level of care Reduction of life-threatening or endangering symptoms to within safe limits Safe-care adequate arrangements made Withdrawal symptoms are absent or subacute and managed without 24-hour nursing intervention  PRELIMINARY DISCHARGE PLAN: Attend aftercare/continuing care group Attend PHP/IOP Attend 12-step recovery group Placement in alternative living arrangements  PATIENT/FAMIILY INVOLVEMENT: This treatment plan has been presented to and reviewed with the patient, Gregory Price. The patient and family have been given the opportunity to ask questions and make suggestions.  Elenore Rota 10/17/2015, 5:33 PM

## 2015-10-17 NOTE — Progress Notes (Signed)
Patient ID: Gregory Price, male   DOB: Oct 25, 1950, 65 y.o.   MRN: [id number]   Pt presents to IP from Obs Unit at [facility]. Pt reports increased SI with a plan to "jump out in front of traffic." Pt reports IV heroin use, had multiple needles in his possession during search. Per report pt is tired of using and states "I just want to detox." Pt also reports that he just recently got help "gettting SS" and currently has no place to live. Pt states he currently loss weight, states he weighs 140 lbs when he actually now weighs 110 lbs. Pt states "I don't know how I lost so much." Pt placed on high fall risk due to tremors, dizziness and overall weakness. Pt currently denies any GI upset but states "I'll probably have diarrhea later." Pt affect flat, behavior is cooperative.   Consents signed, skin/belongings search completed and pt oriented to unit. Pt currently in bed and stable at this time. Pt given the opportunity to express concerns and ask questions. Pt given toiletries. Will continue to monitor.

## 2015-10-17 NOTE — Discharge Summary (Signed)
[facility] OBS UNIT DISCHARGE SUMMARY ADMIT TO INPATIENT  Patient Identification: Gregory Price MRN:  [id number] Date of Evaluation:  10/17/2015 Chief Complaint:  opioid use disorder substance induced mood disorder Principal Diagnosis: Substance induced mood disorder ([place]) Diagnosis:   Patient Active Problem List   Diagnosis Date Noted  . Substance abuse [F19.10] 10/17/2015  . Opiate withdrawal ([place]) [F11.23] 08/09/2014  . Homeless [Z59.0] 08/09/2014  . Nausea & vomiting [R11.2] 08/09/2014  . Hepatic steatosis [K76.0] 08/09/2014  . Constipation due to opioid therapy [K59.03, T40.2X5A] 08/09/2014  . Hx of appendectomy [Z90.49] 08/09/2014  . Bilateral renal cysts [Q61.02] 08/09/2014  . Leukocytosis [D72.829] 08/09/2014  . Tobacco abuse [Z72.0] 08/09/2014  . History of hepatitis C [Z86.19] 08/09/2014  . History of COPD [Z87.09] 08/09/2014  . Opioid withdrawal ([place]) [F11.23] 08/09/2014  . Elevated blood pressure [R03.0] 08/09/2014  . Depression [F32.9] 08/07/2014  . Heroin abuse [F11.10] 08/07/2014  . Substance induced mood disorder ([place]) [F19.94] 08/07/2014  . Suicidal ideation [R45.851] 08/07/2014   Interval history 10/17/2015, Pt seen and chart reviewed. He continues to present with severe withdrawals which warrant inpatient detoxification for opiate withdrawal acute, although not emergent. Pt continues to affirm suicidal ideation and has already been accepted to inpatient.   History of Present Illness:: Gregory Price is an 65 y.o. male, widowed, Caucasian who presents unaccompanied to [facility] ED requesting treatment for heroin use and also reporting suicidal ideation. Pt states he is seeking treatment at this time "because I am tired of it." He reports using heroin for the past eight years. He states he uses 3-6 bags of heroin intravenously daily. He denies alcohol or other substance use, urine drug screen is pending. He states his last use was one day ago and he has a history of withdrawal  symptoms including nausea, vomiting, diarrhea, cramps and fatigue. He states his longest period of clean time is two years.   Pt reports his mood is depressed with symptoms including crying spells, social withdrawal, loss of interest in usual pleasures, decreased concentration, decreased sleep, decreased appetite and feelings of guilt and hopelessness. When he was evaluated by the EDP today he denied suicidal ideation but when he was prepared for discharge he called a suicide hotline. He currently reports suicidal ideation with a plan to jump from an overpass into traffic. Pt denies any history of suicide attempts or para-suicidal behavior. Pt denies any family history of suicide attempts, mental health problems or substance abuse problems. Pt denies homicidal ideation or history of violence. Pt denies any history of psychotic symptoms.  Pt reports he is currently homeless. He cannot identify any support but he does have two adult daughters. He is unemployed. He states he has veteran's benefits. Pt denies any current legal problems. He reports he last received treatment for substance abuse at the [facility] in [place] approximately two years ago. He denies any current outpatient providers. He denies any current prescription medications.  Pt is dressed in hospital scrubs, alert, oriented x4 with normal speech and normal motor behavior. Eye contact is fair. Pt's mood is depressed and affect is depressed and irritable. Thought process is coherent and relevant. There is no indication Pt is currently responding to internal stimuli or experiencing delusional thought content. Pt was generally cooperative and gave brief responses to all questions. He will agree to voluntary inpatient treatment.  Obs Unit:  Miqueas Whilden is interviewed and chart reviewed.  Patient continues to be alert, oriented x4, to person, place, time and situation.  His speech is soft, coherent and low tone, eye contact fair and avoiding.  Patient 's mood and affect is depressed and irritable, thought process is coherent and relevant. Patient with complaint of suicidal ideation, stating "I want to kill myself' but for a plan states "I don't know".  He denies SI/HI/AVH.  There is no indication Patient is responding to internal stimuli.   Patient is also requesting heroin detox.    "I started out with pain medication and when couldn't afford it or get it any more I started the heroin about 5 years ago.  Patient states that he needs to get his life together and get off of the heroin.   Patient is currently having some  withdrawal symptoms. He complains of nausea, vomiting, muscle cramping and general malise. Patient is unsteady on his feet.  Patient denies seizure activity.  Patient reports he feels depressed & had medication management at [facility] [place] in the past but not receiving any current treatment. Patient is not taking any psychiatric medications.   Patient endorses depressive symptoms of: crying spells, social withdrawal, loss of interest in usual pleasures, decreased concentration, decreased sleep, decreased appetite and feelings of guilt and hopelessness. Patient states that he has been in detox once 2 years ago in [place] and was clean for 2 months.  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  Patient's stressors are not being able to control his drug use, not having a place to live.  Patient is homeless.  Patient has two adult daughters who do not want anything to do with him. Pt denies any family history of suicide attempts, mental health problems or substance abuse problems  Previous Psychotropic Medications: Yes  Psychological Evaluations: No  Past Medical History:  Past Medical History  Diagnosis Date  . Lung cancer ([place])     resolved in 2011  . Hepatitis C   . COPD (chronic obstructive pulmonary disease) [facility])     Past Surgical History  Procedure Laterality Date  . Abdominal surgery      2008  . Lung surgery      2011  .  Appendectomy    . Knee surgery      right knee, 1967   Family History:  Family History  Problem Relation Age of Onset  . Cancer Other    Family Psychiatric  History: See HPI Social History:  History  Alcohol Use No    Comment: former     History  Drug Use  . Yes  . Special: Heroin    Comment: heroin    Social History   Social History  . Marital Status: Legally Separated    Spouse Name: N/A  . Number of Children: N/A  . Years of Education: N/A   Social History Main Topics  . Smoking status: Current Every Day Smoker -- 1.00 packs/day for 15 years    Types: Cigarettes  . Smokeless tobacco: None  . Alcohol Use: No     Comment: former  . Drug Use: Yes    Special: Heroin     Comment: heroin  . Sexual Activity: Not Asked   Other Topics Concern  . None   Social History Narrative   Additional Social History:                         Allergies:  No Known Allergies Lab Results:  Results for orders placed or performed during the hospital encounter of 10/16/15 (from the past 48  hour(s))  Urine rapid drug screen (hosp performed)     Status: Abnormal   Collection Time: 10/16/15  9:24 PM  Result Value Ref Range   Opiates POSITIVE (A) NONE DETECTED   Cocaine POSITIVE (A) NONE DETECTED   Benzodiazepines NONE DETECTED NONE DETECTED   Amphetamines NONE DETECTED NONE DETECTED   Tetrahydrocannabinol NONE DETECTED NONE DETECTED   Barbiturates NONE DETECTED NONE DETECTED    Comment:        DRUG SCREEN FOR MEDICAL PURPOSES ONLY.  IF CONFIRMATION IS NEEDED FOR ANY PURPOSE, NOTIFY LAB WITHIN 5 DAYS.        LOWEST DETECTABLE LIMITS FOR URINE DRUG SCREEN Drug Class       Cutoff (ng/mL) Amphetamine      1000 Barbiturate      200 Benzodiazepine   867 Tricyclics       619 Opiates          300 Cocaine          300 THC              50   Comprehensive metabolic panel     Status: Abnormal   Collection Time: 10/16/15  9:28 PM  Result Value Ref Range   Sodium 139  135 - 145 mmol/L   Potassium 4.9 3.5 - 5.1 mmol/L    Comment: SPECIMEN HEMOLYZED. HEMOLYSIS MAY AFFECT INTEGRITY OF RESULTS.   Chloride 103 101 - 111 mmol/L   CO2 21 (L) 22 - 32 mmol/L   Glucose, Bld 98 65 - 99 mg/dL   BUN 11 6 - 20 mg/dL   Creatinine, Ser 0.77 0.61 - 1.24 mg/dL   Calcium 9.3 8.9 - 10.3 mg/dL   Total Protein 7.0 6.5 - 8.1 g/dL   Albumin 3.3 (L) 3.5 - 5.0 g/dL   AST 34 15 - 41 U/L   ALT 20 17 - 63 U/L   Alkaline Phosphatase 78 38 - 126 U/L   Total Bilirubin 1.7 (H) 0.3 - 1.2 mg/dL   GFR calc non Af Amer >60 >60 mL/min   GFR calc Af Amer >60 >60 mL/min    Comment: (NOTE) The eGFR has been calculated using the CKD EPI equation. This calculation has not been validated in all clinical situations. eGFR's persistently <60 mL/min signify possible Chronic Kidney Disease.    Anion gap 15 5 - 15  Ethanol (ETOH)     Status: None   Collection Time: 10/16/15  9:28 PM  Result Value Ref Range   Alcohol, Ethyl (B) <5 <5 mg/dL    Comment:        LOWEST DETECTABLE LIMIT FOR SERUM ALCOHOL IS 5 mg/dL FOR MEDICAL PURPOSES ONLY   Salicylate level     Status: None   Collection Time: 10/16/15  9:28 PM  Result Value Ref Range   Salicylate Lvl <5.0 2.8 - 30.0 mg/dL  Acetaminophen level     Status: Abnormal   Collection Time: 10/16/15  9:28 PM  Result Value Ref Range   Acetaminophen (Tylenol), Serum <10 (L) 10 - 30 ug/mL    Comment:        THERAPEUTIC CONCENTRATIONS VARY SIGNIFICANTLY. A RANGE OF 10-30 ug/mL MAY BE AN EFFECTIVE CONCENTRATION FOR MANY PATIENTS. HOWEVER, SOME ARE BEST TREATED AT CONCENTRATIONS OUTSIDE THIS RANGE. ACETAMINOPHEN CONCENTRATIONS >150 ug/mL AT 4 HOURS AFTER INGESTION AND >50 ug/mL AT 12 HOURS AFTER INGESTION ARE OFTEN ASSOCIATED WITH TOXIC REACTIONS.   CBC     Status: Abnormal   Collection Time: 10/16/15  9:28 PM  Result Value Ref Range   WBC 10.4 4.0 - 10.5 K/uL   RBC 4.99 4.22 - 5.81 MIL/uL   Hemoglobin 14.7 13.0 - 17.0 g/dL   HCT 43.3  39.0 - 52.0 %   MCV 86.8 78.0 - 100.0 fL   MCH 29.5 26.0 - 34.0 pg   MCHC 33.9 30.0 - 36.0 g/dL   RDW 12.6 11.5 - 15.5 %   Platelets 426 (H) 150 - 983 K/uL    Metabolic Disorder Labs:  No results found for: HGBA1C, MPG No results found for: PROLACTIN No results found for: CHOL, TRIG, HDL, CHOLHDL, VLDL, LDLCALC  Current Medications: Current Facility-Administered Medications  Medication Dose Route Frequency Provider Last Rate Last Dose  . acetaminophen (TYLENOL) tablet 650 mg  650 mg Oral Q6H PRN Harriet Butte, NP      . alum & mag hydroxide-simeth (MAALOX/MYLANTA) 200-200-20 MG/5ML suspension 30 mL  30 mL Oral Q4H PRN Harriet Butte, NP      . cloNIDine (CATAPRES) tablet 0.1 mg  0.1 mg Oral QID Benjamine Mola, FNP   0.1 mg at 10/17/15 1206   Followed by  . [START ON 10/19/2015] cloNIDine (CATAPRES) tablet 0.1 mg  0.1 mg Oral BH-qamhs Benjamine Mola, FNP       Followed by  . [START ON 10/21/2015] cloNIDine (CATAPRES) tablet 0.1 mg  0.1 mg Oral QAC breakfast Benjamine Mola, FNP      . dicyclomine (BENTYL) tablet 20 mg  20 mg Oral Q6H PRN Benjamine Mola, FNP      . hydrOXYzine (ATARAX/VISTARIL) tablet 25 mg  25 mg Oral Q6H PRN Benjamine Mola, FNP      . loperamide (IMODIUM) capsule 2-4 mg  2-4 mg Oral PRN Benjamine Mola, FNP      . magnesium hydroxide (MILK OF MAGNESIA) suspension 30 mL  30 mL Oral Daily PRN Harriet Butte, NP      . methocarbamol (ROBAXIN) tablet 500 mg  500 mg Oral Q8H PRN Benjamine Mola, FNP      . naproxen (NAPROSYN) tablet 500 mg  500 mg Oral BID PRN Benjamine Mola, FNP      . ondansetron (ZOFRAN-ODT) disintegrating tablet 4 mg  4 mg Oral Q6H PRN Benjamine Mola, FNP       PTA Medications: Prescriptions prior to admission  Medication Sig Dispense Refill Last Dose  . hydrALAZINE (APRESOLINE) 10 MG tablet Take 1 tablet (10 mg total) by mouth 3 (three) times daily. (Patient not taking: Reported on 01/07/2015) 90 tablet 1 Not Taking at Unknown time  . mirtazapine  (REMERON) 7.5 MG tablet Take 1 tablet (7.5 mg total) by mouth at bedtime. (Patient not taking: Reported on 01/07/2015) 30 tablet 1 Not Taking at Unknown time    Musculoskeletal: Strength & Muscle Tone: decreased Gait & Station: unsteady Patient leans: Right  Psychiatric Specialty Exam: Physical Exam  Review of Systems  Psychiatric/Behavioral: Positive for depression and suicidal ideas. Negative for substance abuse. The patient is nervous/anxious and has insomnia.   All other systems reviewed and are negative.    BP 136/90 mmHg  Pulse 114  Temp(Src) 98.1 F (36.7 C) (Oral)  Resp 16  Ht '5\' 5"'$  (1.651 m)  Wt 49.896 kg (110 lb)  BMI 18.31 kg/m2  SpO2 100%   General Appearance: Disheveled  Eye Contact::  Minimal  Speech:  Clear and Coherent and Slow  Volume:  Decreased  Mood:  Depressed and Hopeless  Affect:  Congruent and Depressed  Thought Process:  Goal Directed, Linear and Logical  Orientation:  Full (Time, Place, and Person)  Thought Content:  WDL  Suicidal Thoughts:  Yes.  without intent/plan  Homicidal Thoughts:  No  Memory:  Immediate;   Good Recent;   Good Remote;   Good  Judgement:  Fair  Insight:  Fair  Psychomotor Activity:  Decreased  Concentration:  Good  Recall:  [place] of Knowledge:Fair  Language: Good  Akathisia:  Negative  Handed:  Right  AIMS (if indicated):     Assets:  Communication Skills Desire for Improvement Physical Health Resilience  ADL's:  Intact  Cognition: WNL  Sleep:      Treatment Plan Summary: Admit to inpatient  Disposition: Admit to psychiatric inpatient to [facility] when bed becomes available.   Benjamine Mola, FNP-BC 10/17/2015 11:44 AM  Reviewed the information documented and agree with the treatment plan.  Anslie Spadafora,JANARDHAHA R. 10/17/2015 6:36 PM

## 2015-10-17 NOTE — Progress Notes (Signed)
Admission Note:  Patient is a 65 year old male  who presents voluntarily to [facility] requesting detox from Heroin use and SI. On admission, patient appear weak, was lying down on couch in the search room. Cannot sit up during the admission proceed but responds to questions asked. Patient stated all I want now is to lie down in bed. I detox so bad. 'am seek to stomach. I have thrown up several times at the ED. The last just happened before I came here".  Patient denies SI, AH/VH at this point. Denies pain but later states all my body ache.  A: Patient could not stand for full body search STATED "I can't stand now. I can't take off my cloth, I can't do nothing. I want to lie down", but this writer did "a blind search" by patting the patient's clothes. Patient reported no bruises, wounds nor tattoo. POC and unit policies explained and understanding verbalized. Consents obtained. Refused food and fluids offered. R: Patient had no additional questions or concerns.

## 2015-10-17 NOTE — Progress Notes (Signed)
Adult Psychoeducational Group Note  Date:  10/17/2015 Time:  9:56 PM  Group Topic/Focus:  Wrap-Up Group:   The focus of this group is to help patients review their daily goal of treatment and discuss progress on daily workbooks.  Participation Level:  Did Not Attend  Additional Comments:  Pt did not attend wrap-up group.   Milus Glazier 10/17/2015, 9:56 PM

## 2015-10-18 DIAGNOSIS — F1110 Opioid abuse, uncomplicated: Secondary | ICD-10-CM

## 2015-10-18 MED ORDER — SERTRALINE HCL 25 MG PO TABS
25.0000 mg | ORAL_TABLET | Freq: Every day | ORAL | Status: DC
  Administered 2015-10-18 – 2015-10-20 (×3): 25 mg via ORAL
  Filled 2015-10-18 (×6): qty 1

## 2015-10-18 MED ORDER — TRAZODONE HCL 50 MG PO TABS
50.0000 mg | ORAL_TABLET | Freq: Every evening | ORAL | Status: DC | PRN
Start: 2015-10-18 — End: 2015-10-20
  Administered 2015-10-18 – 2015-10-19 (×3): 50 mg via ORAL
  Filled 2015-10-18 (×8): qty 1

## 2015-10-18 NOTE — Progress Notes (Signed)
D: Patient observed in room in bed. Patient states he still feels as though he is withdrawing. Patient states he is getting better able to keep some food down without becoming nauseated. Patient wanting to rest and stay in bed.  A: Patient provided support and encouragement. Patient encouraged to drink fluids and seek staff for assistance prior to going to restroom or getting out of bed because of stating he becomes dizzy with getting up; MHT aware. Q 15 minute checks in progress and maintained for safety. R: )Patient remains safe. Monitoring continues.

## 2015-10-18 NOTE — Progress Notes (Signed)
Patient ID: Gregory Price, male   DOB: 10-Apr-1951, 65 y.o.   MRN: [id number]  Pt currently presents with a flat affect and sullen behavior. Per self inventory, pt rates depression at a 8, hopelessness 8 and anxiety 7. Pt's daily goal is to "be able to eat and drink." Pt reports poor sleep, a poor appetite, low energy and poor concentration. Pt reports that he feels dizzy/lightheaded and weak. Also reports withdraw symptoms of muscle cramping, nausea and cold chills.   Pt provided with as needed and scheduled medications per providers orders. Pt's labs and vitals were monitored throughout the day. Pt supported emotionally and encouraged to express concerns and questions. Pt educated on medications and fall risk precautions. Encouraging fluids and nutrition.   Pt's safety ensured with 15 minute and environmental checks. Pt currently denies SI/HI and A/V hallucinations. Pt verbally agrees to seek staff if SI/HI or A/VH occurs and to consult with staff before acting on these thoughts. Pt reports decreased dizziness/nausea, still reporting muscle cramps. Will continue POC.

## 2015-10-18 NOTE — Progress Notes (Signed)
Adult Psychoeducational Group Note  Date:  10/18/2015 Time:  9:36 PM  Group Topic/Focus:  Wrap-Up Group:   The focus of this group is to help patients review their daily goal of treatment and discuss progress on daily workbooks.  Participation Level:  Did not attend  Additional Comments:  Pt was in his room not feeling well. Clint Bolder 10/18/2015, 9:36 PM

## 2015-10-18 NOTE — [facility] Group Notes (Signed)
[facility] Group Notes: (Clinical Social Work)   10/18/2015      Type of Therapy:  Group Therapy   Participation Level:  Did Not Attend despite MHT prompting   Selmer Dominion, LCSW 10/18/2015, 12:22 PM

## 2015-10-18 NOTE — H&P (Signed)
Psychiatric Admission Assessment Adult  Patient Identification: Gregory Price MRN:  [id number] Date of Evaluation:  10/18/2015 Chief Complaint:  opioid use disorder substance induced mood disorder Principal Diagnosis: MDD (major depressive disorder), recurrent severe, without psychosis (HCC) Diagnosis:   Patient Active Problem List   Diagnosis Date Noted  . Substance abuse [F19.10] 10/17/2015  . MDD (major depressive disorder), recurrent severe, without psychosis (HCC) [F33.2] 10/17/2015  . Opiate withdrawal (HCC) [F11.23] 08/09/2014  . Homeless [Z59.0] 08/09/2014  . Nausea & vomiting [R11.2] 08/09/2014  . Hepatic steatosis [K76.0] 08/09/2014  . Constipation due to opioid therapy [K59.03, T40.2X5A] 08/09/2014  . Hx of appendectomy [Z90.49] 08/09/2014  . Bilateral renal cysts [Q61.02] 08/09/2014  . Leukocytosis [D72.829] 08/09/2014  . Tobacco abuse [Z72.0] 08/09/2014  . History of hepatitis C [Z86.19] 08/09/2014  . History of COPD [Z87.09] 08/09/2014  . Opioid withdrawal (HCC) [F11.23] 08/09/2014  . Elevated blood pressure [R03.0] 08/09/2014  . Depression [F32.9] 08/07/2014  . Heroin abuse [F11.10] 08/07/2014  . Substance induced mood disorder (HCC) [F19.94] 08/07/2014  . Suicidal ideation [R45.851] 08/07/2014  History of Present Illness: PER HPI -OBS UNIT NOTE-Gregory Price is an 65 y.o. male, widowed, Caucasian who presents unaccompanied to [facility] ED requesting treatment for heroin use and also reporting suicidal ideation. Pt states he is seeking treatment at this time "because I am tired of it." He reports using heroin for the past eight years. He states he uses 3-6 bags of heroin intravenously daily. He denies alcohol or other substance use, urine drug screen is pending. He states his last use was one day ago and he has a history of withdrawal symptoms including nausea, vomiting, diarrhea, cramps and fatigue. He states his longest period of clean time is two years.   Pt reports his  mood is depressed with symptoms including crying spells, social withdrawal, loss of interest in usual pleasures, decreased concentration, decreased sleep, decreased appetite and feelings of guilt and hopelessness. When he was evaluated by the EDP today he denied suicidal ideation but when he was prepared for discharge he called a suicide hotline. He currently reports suicidal ideation with a plan to jump from an overpass into traffic. Pt denies any history of suicide attempts or para-suicidal behavior. Pt denies any family history of suicide attempts, mental health problems or substance abuse problems. Pt denies homicidal ideation or history of violence. Pt denies any history of psychotic symptoms.  Pt reports he is currently homeless. He cannot identify any support but he does have two adult daughters. He is unemployed. He states he has veteran's benefits. Pt denies any current legal problems. He reports he last received treatment for substance abuse at the [facility] in [place] approximately two years ago. He denies any current outpatient providers. He denies any current prescription medications.  Pt is dressed in hospital scrubs, alert, oriented x4 with normal speech and normal motor behavior. Eye contact is fair. Pt's mood is depressed and affect is depressed and irritable. Thought process is coherent and relevant. There is no indication Pt is currently responding to internal stimuli or experiencing delusional thought content. Pt was generally cooperative and gave brief responses to all questions. He will agree to voluntary inpatient treatment.  On Evaluation: Keilen Kahl is awake, alert and oriented X4 , found resting in bedroom.  Endorsed  suicidal  Ideation with a plan to jump from a bridge. Patient dose contract for safety and will inform staff of thoughts/intent.  Denies auditory or visual hallucination and does not appear  to be responding to internal stimuli. Patient interacts well with staff and  others.States his depression 10/10. Patient states "I am feeling not well today. Patient reports using heroin often and is experiencing. H/A, and body aches from withdrawals." Reports  Poor appetite  and resting okay. Patient report she is excited regarding discharge. Support, encouragement and reassurance was provided.   Associated Signs/Symptoms: Depression Symptoms:  depressed mood, fatigue, feelings of worthlessness/guilt, difficulty concentrating, hopelessness, suicidal thoughts with specific plan, anxiety, (Hypo) Manic Symptoms:  Irritable Mood, Anxiety Symptoms:  Excessive Worry, Psychotic Symptoms:  Hallucinations: None PTSD Symptoms: Avoidance:  Foreshortened Future Total Time spent with patient: 45 minutes  Past Psychiatric History: MDD  Risk to Self: Is patient at risk for suicide?: No Risk to Others:   Prior Inpatient Therapy:   Prior Outpatient Therapy:    Alcohol Screening: 1. How often do you have a drink containing alcohol?: Never 9. Have you or someone else been injured as a result of your drinking?: No 10. Has a relative or friend or a doctor or another health worker been concerned about your drinking or suggested you cut down?: No Alcohol Use Disorder Identification Test Final Score (AUDIT): 0 Brief Intervention: AUDIT score less than 7 or less-screening does not suggest unhealthy drinking-brief intervention not indicated Substance Abuse History in the last 12 months:  Yes.   Consequences of Substance Abuse: Withdrawal Symptoms:   Headaches Vomiting Previous Psychotropic Medications: YES Psychological Evaluations: NO Past Medical History:  Past Medical History  Diagnosis Date  . Lung cancer ([place])     resolved in 2011  . Hepatitis C   . COPD (chronic obstructive pulmonary disease) [facility])     Past Surgical History  Procedure Laterality Date  . Abdominal surgery      2008  . Lung surgery      2011  . Appendectomy    . Knee surgery      right knee,  1967   Family History:  Family History  Problem Relation Age of Onset  . Cancer Other    Family Psychiatric  History:SEE SRA Social History:  History  Alcohol Use No    Comment: former     History  Drug Use  . Yes  . Special: Heroin    Comment: heroin    Social History   Social History  . Marital Status: Legally Separated    Spouse Name: N/A  . Number of Children: N/A  . Years of Education: N/A   Social History Main Topics  . Smoking status: Current Every Day Smoker -- 1.00 packs/day for 15 years    Types: Cigarettes  . Smokeless tobacco: None  . Alcohol Use: No     Comment: former  . Drug Use: Yes    Special: Heroin     Comment: heroin  . Sexual Activity: Not Asked   Other Topics Concern  . None   Social History Narrative   Additional Social History:                         Allergies:  No Known Allergies Lab Results:  Results for orders placed or performed during the hospital encounter of 10/16/15 (from the past 48 hour(s))  Urine rapid drug screen (hosp performed)     Status: Abnormal   Collection Time: 10/16/15  9:24 PM  Result Value Ref Range   Opiates POSITIVE (A) NONE DETECTED   Cocaine POSITIVE (A) NONE DETECTED   Benzodiazepines NONE DETECTED  NONE DETECTED   Amphetamines NONE DETECTED NONE DETECTED   Tetrahydrocannabinol NONE DETECTED NONE DETECTED   Barbiturates NONE DETECTED NONE DETECTED    Comment:        DRUG SCREEN FOR MEDICAL PURPOSES ONLY.  IF CONFIRMATION IS NEEDED FOR ANY PURPOSE, NOTIFY LAB WITHIN 5 DAYS.        LOWEST DETECTABLE LIMITS FOR URINE DRUG SCREEN Drug Class       Cutoff (ng/mL) Amphetamine      1000 Barbiturate      200 Benzodiazepine   371 Tricyclics       696 Opiates          300 Cocaine          300 THC              50   Comprehensive metabolic panel     Status: Abnormal   Collection Time: 10/16/15  9:28 PM  Result Value Ref Range   Sodium 139 135 - 145 mmol/L   Potassium 4.9 3.5 - 5.1 mmol/L     Comment: SPECIMEN HEMOLYZED. HEMOLYSIS MAY AFFECT INTEGRITY OF RESULTS.   Chloride 103 101 - 111 mmol/L   CO2 21 (L) 22 - 32 mmol/L   Glucose, Bld 98 65 - 99 mg/dL   BUN 11 6 - 20 mg/dL   Creatinine, Ser 0.77 0.61 - 1.24 mg/dL   Calcium 9.3 8.9 - 10.3 mg/dL   Total Protein 7.0 6.5 - 8.1 g/dL   Albumin 3.3 (L) 3.5 - 5.0 g/dL   AST 34 15 - 41 U/L   ALT 20 17 - 63 U/L   Alkaline Phosphatase 78 38 - 126 U/L   Total Bilirubin 1.7 (H) 0.3 - 1.2 mg/dL   GFR calc non Af Amer >60 >60 mL/min   GFR calc Af Amer >60 >60 mL/min    Comment: (NOTE) The eGFR has been calculated using the CKD EPI equation. This calculation has not been validated in all clinical situations. eGFR's persistently <60 mL/min signify possible Chronic Kidney Disease.    Anion gap 15 5 - 15  Ethanol (ETOH)     Status: None   Collection Time: 10/16/15  9:28 PM  Result Value Ref Range   Alcohol, Ethyl (B) <5 <5 mg/dL    Comment:        LOWEST DETECTABLE LIMIT FOR SERUM ALCOHOL IS 5 mg/dL FOR MEDICAL PURPOSES ONLY   Salicylate level     Status: None   Collection Time: 10/16/15  9:28 PM  Result Value Ref Range   Salicylate Lvl <7.8 2.8 - 30.0 mg/dL  Acetaminophen level     Status: Abnormal   Collection Time: 10/16/15  9:28 PM  Result Value Ref Range   Acetaminophen (Tylenol), Serum <10 (L) 10 - 30 ug/mL    Comment:        THERAPEUTIC CONCENTRATIONS VARY SIGNIFICANTLY. A RANGE OF 10-30 ug/mL MAY BE AN EFFECTIVE CONCENTRATION FOR MANY PATIENTS. HOWEVER, SOME ARE BEST TREATED AT CONCENTRATIONS OUTSIDE THIS RANGE. ACETAMINOPHEN CONCENTRATIONS >150 ug/mL AT 4 HOURS AFTER INGESTION AND >50 ug/mL AT 12 HOURS AFTER INGESTION ARE OFTEN ASSOCIATED WITH TOXIC REACTIONS.   CBC     Status: Abnormal   Collection Time: 10/16/15  9:28 PM  Result Value Ref Range   WBC 10.4 4.0 - 10.5 K/uL   RBC 4.99 4.22 - 5.81 MIL/uL   Hemoglobin 14.7 13.0 - 17.0 g/dL   HCT 43.3 39.0 - 52.0 %   MCV 86.8 78.0 - 100.0  fL   MCH 29.5  26.0 - 34.0 pg   MCHC 33.9 30.0 - 36.0 g/dL   RDW 82.2 08.8 - 68.5 %   Platelets 426 (H) 150 - 400 K/uL    Metabolic Disorder Labs:  No results found for: HGBA1C, MPG No results found for: PROLACTIN No results found for: CHOL, TRIG, HDL, CHOLHDL, VLDL, LDLCALC  Current Medications: Current Facility-Administered Medications  Medication Dose Route Frequency Provider Last Rate Last Dose  . acetaminophen (TYLENOL) tablet 650 mg  650 mg Oral Q6H PRN Worthy Flank, NP      . alum & mag hydroxide-simeth (MAALOX/MYLANTA) 200-200-20 MG/5ML suspension 30 mL  30 mL Oral Q4H PRN Worthy Flank, NP      . cloNIDine (CATAPRES) tablet 0.1 mg  0.1 mg Oral QID Beau Fanny, FNP   Stopped at 10/18/15 0854   Followed by  . [START ON 10/19/2015] cloNIDine (CATAPRES) tablet 0.1 mg  0.1 mg Oral BH-qamhs Beau Fanny, FNP       Followed by  . [START ON 10/21/2015] cloNIDine (CATAPRES) tablet 0.1 mg  0.1 mg Oral QAC breakfast Beau Fanny, FNP      . dicyclomine (BENTYL) tablet 20 mg  20 mg Oral Q6H PRN Beau Fanny, FNP      . hydrOXYzine (ATARAX/VISTARIL) tablet 25 mg  25 mg Oral Q6H PRN Beau Fanny, FNP      . loperamide (IMODIUM) capsule 2-4 mg  2-4 mg Oral PRN Beau Fanny, FNP      . magnesium hydroxide (MILK OF MAGNESIA) suspension 30 mL  30 mL Oral Daily PRN Worthy Flank, NP      . methocarbamol (ROBAXIN) tablet 500 mg  500 mg Oral Q8H PRN Beau Fanny, FNP      . naproxen (NAPROSYN) tablet 500 mg  500 mg Oral BID PRN Beau Fanny, FNP      . nicotine (NICODERM CQ - dosed in mg/24 hours) patch 21 mg  21 mg Transdermal Daily Rachael Fee, MD   21 mg at 10/18/15 0854  . ondansetron (ZOFRAN-ODT) disintegrating tablet 4 mg  4 mg Oral Q6H PRN Beau Fanny, FNP   4 mg at 10/18/15 5250   PTA Medications: Prescriptions prior to admission  Medication Sig Dispense Refill Last Dose  . hydrALAZINE (APRESOLINE) 10 MG tablet Take 1 tablet (10 mg total) by mouth 3 (three) times daily.  (Patient not taking: Reported on 01/07/2015) 90 tablet 1 Not Taking at Unknown time  . mirtazapine (REMERON) 7.5 MG tablet Take 1 tablet (7.5 mg total) by mouth at bedtime. (Patient not taking: Reported on 01/07/2015) 30 tablet 1 Not Taking at Unknown time    Musculoskeletal: Strength & Muscle Tone: within normal limits Gait & Station: normal Patient leans: N/A  Psychiatric Specialty Exam: Physical Exam  Nursing note and vitals reviewed. Constitutional: He is oriented to person, place, and time. He appears well-developed.  HENT:  Head: Normocephalic.  Neck: Normal range of motion.  Cardiovascular: Normal rate.   Neurological: He is alert and oriented to person, place, and time.  Skin: Skin is warm and dry.  Psychiatric: He has a normal mood and affect. His behavior is normal.    Review of Systems  Psychiatric/Behavioral: Positive for depression and suicidal ideas.  All other systems reviewed and are negative.   Blood pressure 139/75, pulse 54, temperature 98.1 F (36.7 C), temperature source Oral, resp. rate 16, height 5\' 5"  (1.651 m),  weight 49.896 kg (110 lb), SpO2 100 %.Body mass index is 18.31 kg/(m^2).  General Appearance: Disheveled and Guarded  Eye Contact::  Minimal  Speech:  Clear and Coherent  Volume:  Decreased  Mood:  Depressed, Hopeless and Irritable  Affect:  Depressed and Flat  Thought Process:  Coherent and Linear  Orientation:  Full (Time, Place, and Person)  Thought Content:  Hallucinations: None  Suicidal Thoughts:  Yes.  with intent/plan to jump of bridge  Homicidal Thoughts:  No  Memory:  Immediate;   Fair Recent;   Fair Remote;   Fair  Judgement:  Fair  Insight:  Fair  Psychomotor Activity:  Restlessness  Concentration:  Fair  Recall:  [employer] of Knowledge:Fair  Language: Fair  Akathisia:  No  Handed:  Right  AIMS (if indicated):     Assets:  Communication Skills Desire for Improvement Housing Social Support  ADL's:  Intact  Cognition:  WNL  Sleep:  Number of Hours: 6.75     Treatment Plan Summary: Daily contact with patient to assess and evaluate symptoms and progress in treatment and Medication management   Start  with Zoloft 25 mg mg, for mood stabilization/ depression Start with Trazodone 100 mg for insomnia Continue with COWS/ Colinidine Protocol  Will continue to monitor vitals ,medication compliance and treatment side effects while patient is here.   Reviewed labs Glucose 98 WNL ,BAL - <5, UDS - pos for cocaine, opiates CSW will start working on disposition.  Patient to participate in therapeutic milieu   Observation Level/Precautions:  15 minute checks  Laboratory:  CBC Chemistry Profile UDS UA CMP, CBC Abnormal results- reviewed  Psychotherapy: Individual and group session  Medications:  Zoloft '25mg'$  for depression and trazodone 100 mg for insomnia and continue clonidine detox protocol for opioid withdrawal symptoms.   Consultations:  Psychiatry  Discharge Concerns:  Safety, stabilization, and risk of access to medication and medication stabilization   Estimated LOS:5-7 days  Other:     I certify that inpatient services furnished can reasonably be expected to improve the patient's condition.    [place] 1/8/201710:14 AM  Patient seen face-to-face for psychiatric evaluation, case discussed with the staff and nurse practitioner and completed admission suicide risk assessment. Formulated treatment plan and reviewed the information documented and agree with the treatment plan.  Levada Bowersox,JANARDHAHA R. 10/18/2015 11:51 AM

## 2015-10-18 NOTE — [facility] Suicide Risk Assessment (Signed)
[facility] Admission Suicide Risk Assessment   Nursing information obtained from:    Demographic factors:    Current Mental Status:    Loss Factors:    Historical Factors:    Risk Reduction Factors:    Total Time spent with patient: 45 minutes Principal Problem: MDD (major depressive disorder), recurrent severe, without psychosis ([place]) Diagnosis:   Patient Active Problem List   Diagnosis Date Noted  . Substance abuse [F19.10] 10/17/2015  . MDD (major depressive disorder), recurrent severe, without psychosis ([place]) [F33.2] 10/17/2015  . Opiate withdrawal ([place]) [F11.23] 08/09/2014  . Homeless [Z59.0] 08/09/2014  . Nausea & vomiting [R11.2] 08/09/2014  . Hepatic steatosis [K76.0] 08/09/2014  . Constipation due to opioid therapy [K59.03, T40.2X5A] 08/09/2014  . Hx of appendectomy [Z90.49] 08/09/2014  . Bilateral renal cysts [Q61.02] 08/09/2014  . Leukocytosis [D72.829] 08/09/2014  . Tobacco abuse [Z72.0] 08/09/2014  . History of hepatitis C [Z86.19] 08/09/2014  . History of COPD [Z87.09] 08/09/2014  . Opioid withdrawal ([place]) [F11.23] 08/09/2014  . Elevated blood pressure [R03.0] 08/09/2014  . Depression [F32.9] 08/07/2014  . Heroin abuse [F11.10] 08/07/2014  . Substance induced mood disorder ([place]) [F19.94] 08/07/2014  . Suicidal ideation [R45.851] 08/07/2014     Continued Clinical Symptoms:  Alcohol Use Disorder Identification Test Final Score (AUDIT): 0 The "Alcohol Use Disorders Identification Test", Guidelines for Use in Primary Care, Second Edition.  World Pharmacologist [facility]). Score between 0-7:  no or low risk or alcohol related problems. Score between 8-15:  moderate risk of alcohol related problems. Score between 16-19:  high risk of alcohol related problems. Score 20 or above:  warrants further diagnostic evaluation for alcohol dependence and treatment.   CLINICAL FACTORS:   Severe Anxiety and/or Agitation Depression:   Anhedonia Comorbid alcohol  abuse/dependence Hopelessness Impulsivity Insomnia Recent sense of peace/wellbeing Severe Alcohol/Substance Abuse/Dependencies More than one psychiatric diagnosis Previous Psychiatric Diagnoses and Treatments   Musculoskeletal: Strength & Muscle Tone: decreased Gait & Station: normal Patient leans: N/A  Psychiatric Specialty Exam: Physical Exam  ROS nausea, generalized body pain and denied SOB and chest pain. No Fever-chills, No Headache, No changes with Vision or hearing, reports vertigo No problems swallowing food or Liquids, No Chest pain, Cough or Shortness of Breath, No Abdominal pain, No Nausea or Vommitting, Bowel movements are regular, No Blood in stool or Urine, No dysuria, No new skin rashes or bruises, No new joints pains-aches,  No new weakness, tingling, numbness in any extremity, No recent weight gain or loss, No polyuria, polydypsia or polyphagia,   A full 10 point Review of Systems was done, except as stated above, all other Review of Systems were negative.  Blood pressure 139/75, pulse 54, temperature 98.1 F (36.7 C), temperature source Oral, resp. rate 16, height '5\' 5"'$  (1.651 m), weight 49.896 kg (110 lb), SpO2 100 %.Body mass index is 18.31 kg/(m^2).  General Appearance: Disheveled and Guarded  Engineer, water::  Fair  Speech:  Clear and Coherent  Volume:  Decreased  Mood:  Anxious and Depressed  Affect:  Constricted and Depressed  Thought Process:  Coherent and Goal Directed  Orientation:  Full (Time, Place, and Person)  Thought Content:  WDL  Suicidal Thoughts:  Yes.  without intent/plan  Homicidal Thoughts:  No  Memory:  Immediate;   Fair Recent;   Fair  Judgement:  Impaired  Insight:  Fair  Psychomotor Activity:  Decreased  Concentration:  Fair  Recall:  [place] of Knowledge:Good  Language: Good  Akathisia:  Negative  Handed:  Right  AIMS (if indicated):     Assets:  Communication Skills Desire for Improvement Leisure  Time Resilience  Sleep:  Number of Hours: 6.75  Cognition: WNL  ADL's:  Intact     COGNITIVE FEATURES THAT CONTRIBUTE TO RISK:  Closed-mindedness, Loss of executive function and Polarized thinking    SUICIDE RISK:   Mild:  Suicidal ideation of limited frequency, intensity, duration, and specificity.  There are no identifiable plans, no associated intent, mild dysphoria and related symptoms, good self-control (both objective and subjective assessment), few other risk factors, and identifiable protective factors, including available and accessible social support.  PLAN OF CARE: Admit for increased symptoms of depression, anxiety, substance abuse and opioid withdrawal and detox treatment.   Medical Decision Making:  Review of Psycho-Social Stressors (1), Review or order clinical lab tests (1), Established Problem, Worsening (2), Review of Last Therapy Session (1), Review or order medicine tests (1), Review of Medication Regimen & Side Effects (2) and Review of New Medication or Change in Dosage (2)  I certify that inpatient services furnished can reasonably be expected to improve the patient's condition.   Jocelynne Duquette,JANARDHAHA R. 10/18/2015, 10:51 AM

## 2015-10-18 NOTE — Progress Notes (Signed)
D: Patient observed in room on bed with eyes opened. Patient states his goal for today was to "get better" Patient states he feels better today and was able to eat without the nausea or vomiting. Patient with improved appetite and fluid intake. Patient continues to voice the feeling of being dizzy and educated to call for assistance to bathroom and when getting up. A: Support and encouragement offered. Q 15 minute checks in progress and maintained for safety.  R: Patient remains safe on unit and monitoring continues.

## 2015-10-18 NOTE — BHH Counselor (Signed)
Adult Comprehensive Assessment  Patient ID: Gregory Price, male   DOB: 08/27/1951, 65 y.o.   MRN: [id number]  Information Source: Information source: Patient (withdrawal from heroine and suicidal)  Current Stressors:  Educational / Learning stressors: No Employment / Job issues: Unemployed, trying to get SSDI Family Relationships: None Museum/gallery curator / Lack of resources (include bankruptcy): No finances; homeless Housing / Lack of housing: Homeless Physical health (include injuries & life threatening diseases): COPD pretty bad and Hep C; had lung cancer 4 years ago, now in remission Social relationships: No Substance abuse: Heroin Bereavement / Loss: Lost wife about 5 years ago  Living/Environment/Situation:  Living Arrangements: Other (Comment) (homeless) Living conditions (as described by patient or guardian): Lieves behind a building How long has patient lived in current situation?: a few years What is atmosphere in current home:  (peaceful)  Family History:  Marital status: Widowed Widowed, when?: 6 years ago Are you sexually active?: No Does patient have children?: Yes How many children?: 3 How is patient's relationship with their children?: Good relationship with 3 daughters, but does not want to share with them that he is here in the hospital.   Childhood History:  By whom was/is the patient raised?: Both parents Description of patient's relationship with caregiver when they were a child: good Patient's description of current relationship with people who raised him/her: father passed;  hasn't talked to mom in a few years. No conflict just hasn't spoken.  How were you disciplined when you got in trouble as a child/adolescent?: Dad was pretty strict; fair but strict Does patient have siblings?: Yes Number of Siblings: 2 Description of patient's current relationship with siblings: Don't see them or talk to them. But  it would be fine if I didf Did patient suffer any  verbal/emotional/physical/sexual abuse as a child?: No Did patient suffer from severe childhood neglect?: No Has patient ever been sexually abused/assaulted/raped as an adolescent or adult?: No Was the patient ever a victim of a crime or a disaster?: No Witnessed domestic violence?: No Has patient been effected by domestic violence as an adult?: No  Education:  Highest grade of school patient has completed: high school graduate Currently a Ship broker?: No Learning disability?: No  Employment/Work Situation:   Employment situation: Unemployed What is the longest time patient has a held a job?: 7 years Where was the patient employed at that time?: Engineer, 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 Has patient ever been in the [employer]?: Yes (Describe in comment) Dispensing optician) Has patient ever served in Recruitment consultant?: No Did You Receive Any Psychiatric Treatment/Services While in Passenger transport manager?: No (They tried to get him in [place] at 34 but refused) Are There Guns or Other Weapons in [place]?: No  Financial Resources:   Financial resources: No income Does patient have a Programmer, applications or guardian?: No  Alcohol/Substance Abuse:   What has been your use of drugs/alcohol within the last 12 months?: Heroin - daily 3-6 bags a day If attempted suicide, did drugs/alcohol play a role in this?: No Alcohol/Substance Abuse Treatment Hx: Past Tx, Inpatient If yes, describe [facility] Has alcohol/substance abuse ever caused legal problems?: No  Social Support System:   Heritage manager System: None Describe Community Support System: "I have no support." "I do have people helping me get social security right now, but I don't know who they are." Type of faith/religion: Darrick Meigs  How does patient's faith help to cope with current illness?: yes  Leisure/Recreation:   Leisure and Hobbies: read, "I like to read."  Strengths/Needs:   What things does the  patient do well?: Get along with poeple pretty good. In what areas does patient struggle / problems for patient: Substance abuse  Discharge Plan:   Does patient have access to transportation?: No Plan for no access to transportation at discharge: provide bus pass Will patient be returning to same living situation after discharge?: Yes Currently receiving community mental health services: No If no, would patient like referral for services when discharged?: Yes (What county?) [facility]) Does patient have financial barriers related to discharge medications?: Yes Patient description of barriers related to discharge medications: But may be able to get his medications through the [place].  Summary/Recommendations:   Summary and Recommendations (to be completed by the evaluator): Patient is 65 year old homeless man who presented to treat mood and opiate addiction. Patient states that he is currently homeless and lives behind a building. Patient states he has family, mother, brothers and 3 daughters that he has not spoken to in years but there is no conflict. Patient states that he does not desire fo r his daughter to know he is currently doing "because she would just worry." Patient very isolated but open to seeking treatment at this time.   Marja Kays J. 10/18/2015

## 2015-10-18 NOTE — Progress Notes (Signed)
Patient ID: Gregory Price, male   DOB: 1951/09/05, 65 y.o.   MRN: [id number]  Adult Psychoeducational Group Note  Date:  10/18/2015 Time:  10:45 AM  Group Topic/Focus:  Healthy Support Systems  Participation Level:  Did Not Attend  Participation Quality: n/a  Affect:  n/a  Cognitive: n/a  Insight: n/a  Engagement in Group:  n/a  Modes of Intervention:  Activity, Discussion, Education and Support  Additional Comments:  Pt did not attend group. Pt in bed asleep.   Elenore Rota 10/18/2015, 10:45 AM

## 2015-10-19 ENCOUNTER — Encounter (HOSPITAL_COMMUNITY): Payer: Self-pay | Admitting: Psychiatry

## 2015-10-19 DIAGNOSIS — F1120 Opioid dependence, uncomplicated: Secondary | ICD-10-CM | POA: Diagnosis present

## 2015-10-19 DIAGNOSIS — F1123 Opioid dependence with withdrawal: Principal | ICD-10-CM

## 2015-10-19 MED ORDER — BOOST / RESOURCE BREEZE PO LIQD
1.0000 | Freq: Three times a day (TID) | ORAL | Status: DC
  Administered 2015-10-19 – 2015-10-26 (×16): 1 via ORAL
  Filled 2015-10-19 (×28): qty 1

## 2015-10-19 NOTE — BHH Group Notes (Signed)
[place] LCSW Group Therapy  10/19/2015 1:29 PM  Type of Therapy:  Group Therapy  Participation Level:  Did Not Attend-pt invited. DID NOT ATTEND. Chose to remain in bed.   Modes of Intervention:  Confrontation, Discussion, Education, Problem-solving, Rapport Building, Socialization and Support  Summary of Progress/Problems: Today's Topic: Overcoming Obstacles. Patients identified one short term goal and potential obstacles in reaching this goal. Patients processed barriers involved in overcoming these obstacles. Patients identified steps necessary for overcoming these obstacles and explored motivation (internal and external) for facing these difficulties head on.   Smart, Brittannie Tawney LCSW 10/19/2015, 1:29 PM

## 2015-10-19 NOTE — Tx Team (Signed)
Interdisciplinary Treatment Plan Update (Adult)  Date:  10/19/2015  Time Reviewed:  3:29 PM   Progress in Treatment: Attending groups: No. Participating in groups:  No. Taking medication as prescribed:  Yes. Tolerating medication:  Yes. Family/Significant othe contact made:   Patient understands diagnosis:  Yes. and As evidenced by:  seeking treatment for SI, depression, Heroin abuse, and for medication stabilization. Discussing patient identified problems/goals with staff:  Yes. Medical problems stabilized or resolved:  Yes. Denies suicidal/homicidal ideation: No.Passive SI/able to contract for safety on the unit.  Issues/concerns per patient self-inventory:  Other:  New problem(s) identified:  Pt has not been attending groups and continues to isolate in his room.   Discharge Plan or Barriers:  CSW assessing. [facility]   Reason for Continuation of Hospitalization: Depression Medication stabilization Suicidal ideation Withdrawal symptoms  Comments:  Gregory Price is an 65 y.o. male, widowed, Caucasian who [facility] requesting treatment for heroin use and also reporting suicidal ideation. Pt states he is seeking treatment at this time "because I am tired of it." He reports using heroin for the past eight years. He states he uses 3-6 bags of heroin intravenously daily. He denies alcohol or other substance use, urine drug screen is pending. He states his last use was one day ago and he has a history of withdrawal symptoms including nausea, vomiting, diarrhea, cramps and fatigue. He states his longest period of clean time is two years. Pt reports his mood is depressed with symptoms including crying spells, social withdrawal, loss of interest in usual pleasures, decreased concentration, decreased sleep, decreased appetite and feelings of guilt and hopelessness. When he was evaluated by the EDP today he denied suicidal  ideation but when he was prepared for discharge he called a suicide hotline. He currently reports suicidal ideation with a plan to jump from an overpass into traffic. Pt denies any history of suicide attempts or para-suicidal behavior. Pt denies any family history of suicide attempts, mental health problems or substance abuse problems. Pt denies homicidal ideation or history of violence. Pt denies any history of psychotic symptoms.Pt reports he is currently homeless. He cannot identify any support but he does have two adult daughters. He is unemployed. He states he has veteran's benefits. Pt denies any current legal problems. He reports he last received treatment for substance abuse at the [facility] in [place] approximately two years ago. He denies any current outpatient providers. He denies any current prescription medications.   Estimated length of stay:  3-5 days   New goal(s): to develop effective aftercare plan.   Additional Comments:  Patient and CSW reviewed pt's identified goals and treatment plan. Patient verbalized understanding and agreed to treatment plan. CSW reviewed [facility] "Discharge Process and Patient Involvement" Form. Pt verbalized understanding of information provided and signed form.    Review of initial/current patient goals per problem list:  1. Goal(s): Patient will participate in aftercare plan  Met: No.   Target date: at discharge  As evidenced by: Patient will participate within aftercare plan AEB aftercare provider and housing plan at discharge being identified.  1/9: CSW assessing for appropriate referrals. Pt did not attend d/c planning group today.   2. Goal (s): Patient will exhibit decreased depressive symptoms and suicidal ideations.  Met: No.    Target date: at discharge  As evidenced by: Patient will utilize self rating of depression at 3 or below and demonstrate decreased signs of depression  or be deemed stable for discharge by MD.  1/9: Pt  rates depression as high with passive SI/able to contract for safety on the unit.   3. Goal(s): Patient will demonstrate decreased signs of withdrawal due to substance abuse  Met:No.   Target date:at discharge   As evidenced by: Patient will produce a CIWA/COWS score of 0, have stable vitals signs, and no symptoms of withdrawal.  1/9: Pt reports moderate withdrawals with COWS of 7 and stable vitals. Goal progressing.   Attendees: Patient:   10/19/2015 3:29 PM   Family:   10/19/2015 3:29 PM   Physician:  Dr. Carlton Adam, MD 10/19/2015 3:29 PM   Nursing:   Natale Milch RN 10/19/2015 3:29 PM   Clinical Social Worker: Maxie Better, LCSW 10/19/2015 3:29 PM   Clinical Social Worker:  Peri Maris [place] 10/19/2015 3:29 PM    10/19/2015 3:29 PM    10/19/2015 3:29 PM   Other:  Agustina Caroli NP 10/19/2015 3:29 PM   Other:  10/19/2015 3:29 PM   Other:  10/19/2015 3:29 PM   Other:  10/19/2015 3:29 PM    10/19/2015 3:29 PM    10/19/2015 3:29 PM    10/19/2015 3:29 PM    10/19/2015 3:29 PM    Scribe for Treatment Team:   Maxie Better, LCSW 10/19/2015 3:29 PM

## 2015-10-19 NOTE — Progress Notes (Signed)
[facility] MD Progress Note  10/19/2015 11:14 AM Gregory Price  MRN:  [id number] Subjective:  Gregory Price states that he is feeling really overwhelmed. States he has been homeless for the last several years. He states he abuses opioids. He started with pain pills and switched to heroin as it was cheaper. He is a English as a second language teacher and says he was last in the Gregory Price 2 years ago. He states he has someone trying to help him with VA and Social Security as he had not done anything to try to get himself together. He does admit to depression independent of his substance use. He was treated with Zoloft in the past what he claims to have taken for months with no benefit.  Principal Problem: MDD (major depressive disorder), recurrent severe, without psychosis (Gregory Price) Diagnosis:   Patient Active Problem List   Diagnosis Date Noted  . Substance abuse [F19.10] 10/17/2015  . MDD (major depressive disorder), recurrent severe, without psychosis (Gregory Price) [F33.2] 10/17/2015  . Opiate withdrawal (Gregory Price) [F11.23] 08/09/2014  . Homeless [Z59.0] 08/09/2014  . Nausea & vomiting [R11.2] 08/09/2014  . Hepatic steatosis [K76.0] 08/09/2014  . Constipation due to opioid therapy [K59.03, T40.2X5A] 08/09/2014  . Hx of appendectomy [Z90.49] 08/09/2014  . Bilateral renal cysts [Q61.02] 08/09/2014  . Leukocytosis [D72.829] 08/09/2014  . Tobacco abuse [Z72.0] 08/09/2014  . History of hepatitis C [Z86.19] 08/09/2014  . History of COPD [Z87.09] 08/09/2014  . Opioid withdrawal (Gregory Price) [F11.23] 08/09/2014  . Elevated blood pressure [Gregory Price] 08/09/2014  . Depression [F32.9] 08/07/2014  . Heroin abuse [F11.10] 08/07/2014  . Substance induced mood disorder (Gregory Price) [F19.94] 08/07/2014  . Suicidal ideation [Gregory Price] 08/07/2014   Total Time spent with patient: 30 minutes  Past Psychiatric History: see admission H and P  Past Medical History:  Past Medical History  Diagnosis Date  . Lung cancer (Gregory Price)     resolved in 2011  . Hepatitis C   . COPD (chronic obstructive  pulmonary disease) [facility])     Past Surgical History  Procedure Laterality Date  . Abdominal surgery      2008  . Lung surgery      2011  . Appendectomy    . Knee surgery      right knee, 1967   Family History:  Family History  Problem Relation Age of Onset  . Cancer Other    Family Psychiatric  History: see admission H and P Social History:  History  Alcohol Use No    Comment: former     History  Drug Use  . Yes  . Special: Heroin    Comment: heroin    Social History   Social History  . Marital Status: Legally Separated    Spouse Name: N/A  . Number of Children: N/A  . Years of Education: N/A   Social History Main Topics  . Smoking status: Current Every Day Smoker -- 1.00 packs/day for 15 years    Types: Cigarettes  . Smokeless tobacco: None  . Alcohol Use: No     Comment: former  . Drug Use: Yes    Special: Heroin     Comment: heroin  . Sexual Activity: Not Asked   Other Topics Concern  . None   Social History Narrative   Additional Social History:                         Sleep: Fair  Appetite:  Fair  Current Medications: Current Facility-Administered Medications  Medication Dose Route Frequency  Provider Last Rate Last Dose  . acetaminophen (TYLENOL) tablet 650 mg  650 mg Oral Q6H PRN Gregory Butte, NP      . alum & mag hydroxide-simeth (MAALOX/MYLANTA) 200-200-20 MG/5ML suspension 30 mL  30 mL Oral Q4H PRN Gregory Butte, NP      . cloNIDine (CATAPRES) tablet 0.1 mg  0.1 mg Oral BH-qamhs Gregory Mola, FNP   0.1 mg at 10/19/15 0840   Followed by  . [START ON 10/21/2015] cloNIDine (CATAPRES) tablet 0.1 mg  0.1 mg Oral QAC breakfast Gregory Mola, FNP      . dicyclomine (BENTYL) tablet 20 mg  20 mg Oral Q6H PRN Gregory Mola, FNP   20 mg at 10/19/15 0840  . feeding supplement (BOOST / RESOURCE BREEZE) liquid 1 Container  1 Container Oral TID BM Gregory Bloom, MD      . hydrOXYzine (ATARAX/VISTARIL) tablet 25 mg  25 mg Oral Q6H PRN  Gregory Mola, FNP      . loperamide (IMODIUM) capsule 2-4 mg  2-4 mg Oral PRN Gregory Mola, FNP      . magnesium hydroxide (MILK OF MAGNESIA) suspension 30 mL  30 mL Oral Daily PRN Gregory Butte, NP      . methocarbamol (ROBAXIN) tablet 500 mg  500 mg Oral Q8H PRN Gregory Mola, FNP      . naproxen (NAPROSYN) tablet 500 mg  500 mg Oral BID PRN Gregory Mola, FNP      . nicotine (NICODERM CQ - dosed in mg/24 hours) patch 21 mg  21 mg Transdermal Daily Gregory Bloom, MD   21 mg at 10/19/15 0840  . ondansetron (ZOFRAN-ODT) disintegrating tablet 4 mg  4 mg Oral Q6H PRN Gregory Mola, FNP   4 mg at 10/18/15 0947  . sertraline (ZOLOFT) tablet 25 mg  25 mg Oral Daily Gregory Center, NP   25 mg at 10/19/15 0841  . traZODone (DESYREL) tablet 50 mg  50 mg Oral QHS,MR X 1 Gregory Center, NP   50 mg at 10/18/15 2133    Lab Results: No results found for this or any previous visit (from the past 48 hour(s)).  Physical Findings: AIMS:  , ,  ,  ,    CIWA:  CIWA-Ar Total: 15 COWS:  COWS Total Score: 7  Musculoskeletal: Strength & Muscle Tone: within normal limits Gait & Station: normal Patient leans: normal  Psychiatric Specialty Exam: Review of Systems  Constitutional: Positive for weight loss and malaise/fatigue.  Eyes: Negative.   Respiratory: Positive for shortness of breath.        Pack a day  Cardiovascular: Negative.   Gastrointestinal: Positive for heartburn and nausea.  Genitourinary: Negative.   Musculoskeletal: Positive for back pain and joint pain.  Skin: Negative.   Neurological: Positive for weakness and headaches.  Endo/Heme/Allergies: Negative.   Psychiatric/Behavioral: Positive for depression and substance abuse. The patient is nervous/anxious and has insomnia.     Blood pressure 121/86, pulse 85, temperature 97.6 F (36.4 C), temperature source Oral, resp. rate 16, height '5\' 5"'$  (1.651 m), weight 49.896 kg (110 lb), SpO2 100 %.Body mass index is 18.31 kg/(m^2).   General Appearance: Disheveled  Eye Contact::  Minimal  Speech:  Clear and Coherent  Volume:  Decreased  Mood:  Anxious and Depressed  Affect:  Depressed and Restricted  Thought Process:  Coherent and Goal Directed  Orientation:  Full (Time, Place, and Person)  Thought  Content:  symptoms events worries concerns  Suicidal Thoughts:  No  Homicidal Thoughts:  No  Memory:  Immediate;   Fair Recent;   Fair Remote;   Fair  Judgement:  Fair  Insight:  Lacking and Shallow  Psychomotor Activity:  Restlessness  Concentration:  Fair  Recall:  [employer] of Knowledge:Fair  Language: Fair  Akathisia:  No  Handed:  Right  AIMS (if indicated):     Assets:  Desire for Improvement  ADL's:  Intact  Cognition: WNL  Sleep:  Number of Hours: 6.25   Treatment Plan Summary: Daily contact with patient to assess and evaluate symptoms and progress in treatment and Medication management Supportive approach/coping skills Opioid dependence; continue the clonidine detox protocol/work a relapse prevention plan Depression; given that has tried the Zoloft before with no reported benefit, will D/C the Zoloft and try Prozac 10 mg daily, with plans to increase to 20 mg  Work with CBT/mindfulness Explore residential treatment options Ether Goebel A 10/19/2015, 11:14 AM

## 2015-10-19 NOTE — Progress Notes (Signed)
Recreation Therapy Notes  Date: 01.09.2017 Time: 9:30am Location: [address] Dayroom   Group Topic: Stress Management  Goal Area(s) Addresses:  Patient will actively participate in stress management techniques presented during session.   Behavioral Response: Did not attend.    Laureen Ochs Tajai Ihde, LRT/CTRS        Tenya Araque L 10/19/2015 2:58 PM

## 2015-10-19 NOTE — BHH Group Notes (Signed)
[facility] LCSW Aftercare Discharge Planning Group Note   10/19/2015 11:06 AM  Participation Quality:  Invited. DID NOT ATTEND. Pt chose to remain in bed.   Smart, Breland Trouten LCSW

## 2015-10-19 NOTE — Progress Notes (Signed)
NUTRITION ASSESSMENT  Pt identified as at risk on the Malnutrition Screen Tool  INTERVENTION: 1. Educated patient on the importance of nutrition and encouraged intake of food and beverages. 2. Discussed weight goals. 3. Supplements: Continue Boost Breeze po TID, each supplement provides 250 kcal and 9 grams of protein  NUTRITION DIAGNOSIS: Unintentional weight loss related to sub-optimal intake as evidenced by pt report.   Goal: Pt to meet >/= 90% of their estimated nutrition needs.  Monitor:  PO intake  Assessment:  Pt seen for MST. Pt admitted for MDD without psychosis. He also stated withdrawal from heroine upon admission. On day of admission pt was unable to tolerate POs without vomiting; per notes, this is a usual occurrence for this pt when he is withdrawing from heroine. This has since resolved mainly and he has been able to tolerate small amounts without issue. Boost Breeze already ordered TID and is more appropriate than Ensure at this time given resolving N/V.   Pt states previous UBW was 140 lbs. This is consistent with weight from March 2016. Per weight hx review, pt has lost 35 lbs (24% body weight) in the past 10 months which is significant for time frame.   Encourage pt to continue to eat and drink as he is able without exacerbating withdrawal symptoms.  65 y.o. male  Height: Ht Readings from Last 1 Encounters:  10/17/15 '5\' 5"'$  (1.651 m)    Weight: Wt Readings from Last 1 Encounters:  10/17/15 110 lb (49.896 kg)    Weight Hx: Wt Readings from Last 10 Encounters:  10/17/15 110 lb (49.896 kg)  01/07/15 145 lb (65.772 kg)  08/10/14 122 lb (55.339 kg)  08/09/14 122 lb 12.7 oz (55.7 kg)    BMI:  Body mass index is 18.31 kg/(m^2). Pt meets criteria for underweight based on current BMI.  Estimated Nutritional Needs: Kcal: 25-30 kcal/kg Protein: > 1 gram protein/kg Fluid: 1 ml/kcal  Diet Order: Diet regular Room service appropriate?: Yes; Fluid consistency::  Thin Pt is also offered choice of unit snacks mid-morning and mid-afternoon.  Pt is eating as desired.   Lab results and medications reviewed.      Jarome Matin, RD, LDN Inpatient Clinical Dietitian Pager # [phone] After hours/weekend pager # [phone]

## 2015-10-19 NOTE — Progress Notes (Signed)
Adult Psychoeducational Group Note  Date:  10/19/2015 Time:  9:41 PM  Group Topic/Focus:  Wrap-Up Group:   The focus of this group is to help patients review their daily goal of treatment and discuss progress on daily workbooks.  Participation Level:  Did not attend.  Additional Comments:  Pt stayed in his room as he was not feeling well.  Clint Bolder 10/19/2015, 9:41 PM

## 2015-10-19 NOTE — Progress Notes (Signed)
Patient ID: Geoffery Aultman, male   DOB: [birth year], 65 y.o.   MRN: [id number]  Pt currently presents with a flat affect and depressed behavior. Per self inventory, pt rates depression, hopelessness and anxiety at a 7. Pt's daily goal is "try to move around some" and they intend to do so by "could I make a phone call." Pt reports poor sleep, a fair appetite, low energy and poor concentration.   Pt provided with medications per providers orders. Pt's labs and vitals were monitored throughout the day. Pt supported emotionally and encouraged to express concerns and questions. Pt educated on medications.  Pt's safety ensured with 15 minute and environmental checks. Pt endorses some SI, states "I can't do anything while I'm here." Pt currently denies HI and A/V hallucinations. Pt verbally agrees to seek staff if HI or A/VH occurs and to consult with staff before acting on any harmful thoughts. Will continue POC.

## 2015-10-20 MED ORDER — FLUOXETINE HCL 10 MG PO CAPS
10.0000 mg | ORAL_CAPSULE | Freq: Every day | ORAL | Status: DC
  Administered 2015-10-21: 10 mg via ORAL
  Filled 2015-10-20 (×3): qty 1

## 2015-10-20 MED ORDER — TRAZODONE HCL 100 MG PO TABS
100.0000 mg | ORAL_TABLET | Freq: Every evening | ORAL | Status: DC | PRN
  Administered 2015-10-20: 100 mg via ORAL
  Filled 2015-10-20 (×7): qty 1

## 2015-10-20 NOTE — Progress Notes (Signed)
[facility] MD Progress Note  10/20/2015 7:22 PM Gregory Price  MRN:  [id number] Subjective:  Still not feeling well. He continues to endorse feeling nauseated. He is not sleeping well either. He states that he really wants to get his life back together. He has some support in a friend who is trying to help him get his social security, etc. State she really want to quit. Thinking about how he survived his lung cancer as it was discovered early and was removed.  Principal Problem: MDD (major depressive disorder), recurrent severe, without psychosis ([place]) Diagnosis:   Patient Active Problem List   Diagnosis Date Noted  . Opioid type dependence, continuous ([place]) [F11.20] 10/19/2015  . MDD (major depressive disorder), recurrent severe, without psychosis ([place]) [F33.2] 10/17/2015  . Opiate withdrawal ([place]) [F11.23] 08/09/2014  . Hepatic steatosis [K76.0] 08/09/2014  . Constipation due to opioid therapy [K59.03, T40.2X5A] 08/09/2014  . Hx of appendectomy [Z90.49] 08/09/2014  . Bilateral renal cysts [Q61.02] 08/09/2014  . Leukocytosis [D72.829] 08/09/2014  . Tobacco abuse [Z72.0] 08/09/2014  . History of hepatitis C [Z86.19] 08/09/2014  . History of COPD [Z87.09] 08/09/2014  . Opioid withdrawal ([place]) [F11.23] 08/09/2014  . Elevated blood pressure [R03.0] 08/09/2014  . Substance induced mood disorder ([place]) [F19.94] 08/07/2014  . Suicidal ideation [R45.851] 08/07/2014   Total Time spent with patient: 15 minutes  Past Psychiatric History: see admission H and P  Past Medical History:  Past Medical History  Diagnosis Date  . Lung cancer ([place])     resolved in 2011  . Hepatitis C   . COPD (chronic obstructive pulmonary disease) [facility])     Past Surgical History  Procedure Laterality Date  . Abdominal surgery      2008  . Lung surgery      2011  . Appendectomy    . Knee surgery      right knee, 1967   Family History:  Family History  Problem Relation Age of Onset  . Cancer Other    Family  Psychiatric  History: see admission H and P Social History:  History  Alcohol Use No    Comment: former     History  Drug Use  . Yes  . Special: Heroin    Comment: heroin    Social History   Social History  . Marital Status: Legally Separated    Spouse Name: N/A  . Number of Children: N/A  . Years of Education: N/A   Social History Main Topics  . Smoking status: Current Every Day Smoker -- 1.00 packs/day for 15 years    Types: Cigarettes  . Smokeless tobacco: None  . Alcohol Use: No     Comment: former  . Drug Use: Yes    Special: Heroin     Comment: heroin  . Sexual Activity: Not Asked   Other Topics Concern  . None   Social History Narrative   Additional Social History:                         Sleep: Poor  Appetite:  Poor  Current Medications: Current Facility-Administered Medications  Medication Dose Route Frequency Provider Last Rate Last Dose  . acetaminophen (TYLENOL) tablet 650 mg  650 mg Oral Q6H PRN Harriet Butte, NP      . alum & mag hydroxide-simeth (MAALOX/MYLANTA) 200-200-20 MG/5ML suspension 30 mL  30 mL Oral Q4H PRN Harriet Butte, NP      . cloNIDine (CATAPRES) tablet 0.1  mg  0.1 mg Oral BH-qamhs Benjamine Mola, FNP   0.1 mg at 10/20/15 0854   Followed by  . [START ON 10/21/2015] cloNIDine (CATAPRES) tablet 0.1 mg  0.1 mg Oral QAC breakfast Benjamine Mola, FNP      . dicyclomine (BENTYL) tablet 20 mg  20 mg Oral Q6H PRN Benjamine Mola, FNP   20 mg at 10/19/15 0840  . feeding supplement (BOOST / RESOURCE BREEZE) liquid 1 Container  1 Container Oral TID BM Nicholaus Bloom, MD   1 Container at 10/19/15 2000  . hydrOXYzine (ATARAX/VISTARIL) tablet 25 mg  25 mg Oral Q6H PRN Benjamine Mola, FNP      . loperamide (IMODIUM) capsule 2-4 mg  2-4 mg Oral PRN Benjamine Mola, FNP      . magnesium hydroxide (MILK OF MAGNESIA) suspension 30 mL  30 mL Oral Daily PRN Harriet Butte, NP      . methocarbamol (ROBAXIN) tablet 500 mg  500 mg Oral Q8H PRN  Benjamine Mola, FNP      . naproxen (NAPROSYN) tablet 500 mg  500 mg Oral BID PRN Benjamine Mola, FNP      . nicotine (NICODERM CQ - dosed in mg/24 hours) patch 21 mg  21 mg Transdermal Daily Nicholaus Bloom, MD   21 mg at 10/20/15 0853  . ondansetron (ZOFRAN-ODT) disintegrating tablet 4 mg  4 mg Oral Q6H PRN Benjamine Mola, FNP   4 mg at 10/20/15 0140  . sertraline (ZOLOFT) tablet 25 mg  25 mg Oral Daily Derrill Center, NP   25 mg at 10/20/15 0854  . traZODone (DESYREL) tablet 50 mg  50 mg Oral QHS,MR X 1 Derrill Center, NP   50 mg at 10/19/15 2230    Lab Results: No results found for this or any previous visit (from the past 48 hour(s)).  Physical Findings: AIMS:  , ,  ,  ,    CIWA:  CIWA-Ar Total: 15 COWS:  COWS Total Score: 13  Musculoskeletal: Strength & Muscle Tone: within normal limits Gait & Station: normal Patient leans: normal  Psychiatric Specialty Exam: Review of Systems  Constitutional: Positive for malaise/fatigue.  Eyes: Negative.   Respiratory: Negative.   Cardiovascular: Negative.   Gastrointestinal: Positive for nausea and vomiting.  Genitourinary: Negative.   Musculoskeletal: Negative.   Skin: Negative.   Neurological: Positive for weakness and headaches.  Endo/Heme/Allergies: Negative.   Psychiatric/Behavioral: Positive for depression and substance abuse. The patient is nervous/anxious and has insomnia.     Blood pressure 95/67, pulse 119, temperature 98.2 F (36.8 C), temperature source Oral, resp. rate 16, height '5\' 5"'$  (1.651 m), weight 49.896 kg (110 lb), SpO2 100 %.Body mass index is 18.31 kg/(m^2).  General Appearance: Fairly Groomed  Engineer, water::  Fair  Speech:  Clear and Coherent  Volume:  Decreased  Mood:  Anxious and Depressed  Affect:  Depressed  Thought Process:  Coherent and Goal Directed  Orientation:  Full (Time, Place, and Person)  Thought Content:  symptoms events worries concerns  Suicidal Thoughts:  No  Homicidal Thoughts:  No   Memory:  Immediate;   Fair Recent;   Fair Remote;   Fair  Judgement:  Fair  Insight:  Present and Shallow  Psychomotor Activity:  Decreased  Concentration:  Fair  Recall:  [place]  Language: Fair  Akathisia:  No  Handed:  Right  AIMS (if indicated):  Assets:  Desire for Improvement  ADL's:  Intact  Cognition: WNL  Sleep:  Number of Hours: 4   Treatment Plan Summary: Daily contact with patient to assess and evaluate symptoms and progress in treatment and Medication management Supportive approach/coping skills Opioid dependence; continue the clonidine detox protocol Nausea; will use the Zofran before he gets his meals Insomnia; will increase the Trazodone to 100 mg HS PRN Depression ; will D/C the Zoloft and start Prozac 10 mg in AM Work with CBT/mindfulness Maccoy Haubner A 10/20/2015, 7:22 PM

## 2015-10-20 NOTE — Progress Notes (Signed)
Adult Psychoeducational Group Note  Date:  10/20/2015 Time:  11:51 PM  Group Topic/Focus:  Wrap-Up Group:   The focus of this group is to help patients review their daily goal of treatment and discuss progress on daily workbooks.  Participation Level:  Did Not Attend  Participation Quality:  Did not attend  Affect:  Did not attend  Cognitive:  Did not attend  Insight: None  Engagement in Group:  Did not attend  Modes of Intervention:  Did not attend  Additional Comments:  Patient did not attend wrap up group tonight.  Tuwana Kapaun L Hoorain Kozakiewicz 10/20/2015, 11:51 PM

## 2015-10-20 NOTE — Progress Notes (Signed)
Patient ID: Gregory Price, male   DOB: [birth year], 65 y.o.   MRN: [id number] Pt report N/V X3 . Medication administrated. Pt reports relief.

## 2015-10-20 NOTE — [facility] Group Notes (Signed)
[facility] LCSW Group Therapy 10/20/2015  1:15 PM   Type of Therapy: Group Therapy  Participation Level: Did Not Attend. Patient invited to participate but declined.   Kadience Macchi, MSW, LCSWA Clinical Social Worker [facility] [phone]   

## 2015-10-20 NOTE — Progress Notes (Signed)
Patient ID: Gregory Price, male   DOB: [birth year], 65 y.o.   MRN: [id number] D: Patient in room reading on approach. Pt reports doing well but still unsteady on his feet. Pt endorses passive suicidal contracts to not hurt self while here at [facility]. Pt denies HI/AVH and pain. Cooperative with assessment.   A: Met with pt 1:1. Medications administered as prescribed. Support and encouragement provided.  R: Patient remains safe and complaint with medications.

## 2015-10-20 NOTE — Progress Notes (Signed)
Patient has been in the bed for the duration of the shift.  Patient has had complaints of nausea, but has only requested gingerale.  Patient denies SI, HI and AVH but is having complaints of withdrawals.   Assess patient for safety, assess patient for withdrawal.  Offer medications as prescribed,   Patient able to contract for safety.

## 2015-10-21 MED ORDER — TEMAZEPAM 15 MG PO CAPS
30.0000 mg | ORAL_CAPSULE | Freq: Every evening | ORAL | Status: DC | PRN
  Administered 2015-10-21 – 2015-10-25 (×5): 30 mg via ORAL
  Filled 2015-10-21 (×5): qty 2

## 2015-10-21 MED ORDER — FLUOXETINE HCL 20 MG PO CAPS
20.0000 mg | ORAL_CAPSULE | Freq: Every day | ORAL | Status: DC
  Administered 2015-10-22 – 2015-10-26 (×5): 20 mg via ORAL
  Filled 2015-10-21 (×4): qty 1
  Filled 2015-10-21: qty 14
  Filled 2015-10-21 (×2): qty 1

## 2015-10-21 NOTE — [facility] Group Notes (Signed)
[place] LCSW Group Therapy 10/21/2015  1:15 PM   Type of Therapy: Group Therapy  Participation Level: Did Not Attend. Patient invited to participate but declined.   Tilden Fossa, MSW, [place] Worker [facility] [phone]

## 2015-10-21 NOTE — Progress Notes (Signed)
[facility] MD Progress Note  10/21/2015 7:31 PM Gregory Price  MRN:  [id number] Subjective:  Gregory Price states that he has not been able to rest at night. He did eat some better today. At least half of his plate and did not vomit. He is encouraged. He states that if he was able to rest at night he would be feeling much better. His situation in terms of where he is going from here is not clear. Principal Problem: MDD (major depressive disorder), recurrent severe, without psychosis ([place]) Diagnosis:   Patient Active Problem List   Diagnosis Date Noted  . Opioid type dependence, continuous ([place]) [F11.20] 10/19/2015  . MDD (major depressive disorder), recurrent severe, without psychosis ([place]) [F33.2] 10/17/2015  . Opiate withdrawal ([place]) [F11.23] 08/09/2014  . Hepatic steatosis [K76.0] 08/09/2014  . Constipation due to opioid therapy [K59.03, T40.2X5A] 08/09/2014  . Hx of appendectomy [Z90.49] 08/09/2014  . Bilateral renal cysts [Q61.02] 08/09/2014  . Leukocytosis [D72.829] 08/09/2014  . Tobacco abuse [Z72.0] 08/09/2014  . History of hepatitis C [Z86.19] 08/09/2014  . History of COPD [Z87.09] 08/09/2014  . Opioid withdrawal ([place]) [F11.23] 08/09/2014  . Elevated blood pressure [R03.0] 08/09/2014  . Substance induced mood disorder ([place]) [F19.94] 08/07/2014  . Suicidal ideation [R45.851] 08/07/2014   Total Time spent with patient: 20 minutes  Past Psychiatric History: see admission H and P  Past Medical History:  Past Medical History  Diagnosis Date  . Lung cancer ([place])     resolved in 2011  . Hepatitis C   . COPD (chronic obstructive pulmonary disease) [facility])     Past Surgical History  Procedure Laterality Date  . Abdominal surgery      2008  . Lung surgery      2011  . Appendectomy    . Knee surgery      right knee, 1967   Family History:  Family History  Problem Relation Age of Onset  . Cancer Other    Family Psychiatric  History: see admission H and P Social History:  History   Alcohol Use No    Comment: former     History  Drug Use  . Yes  . Special: Heroin    Comment: heroin    Social History   Social History  . Marital Status: Legally Separated    Spouse Name: N/A  . Number of Children: N/A  . Years of Education: N/A   Social History Main Topics  . Smoking status: Current Every Day Smoker -- 1.00 packs/day for 15 years    Types: Cigarettes  . Smokeless tobacco: None  . Alcohol Use: No     Comment: former  . Drug Use: Yes    Special: Heroin     Comment: heroin  . Sexual Activity: Not Asked   Other Topics Concern  . None   Social History Narrative   Additional Social History:                         Sleep: Poor  Appetite:  better today  Current Medications: Current Facility-Administered Medications  Medication Dose Route Frequency Provider Last Rate Last Dose  . acetaminophen (TYLENOL) tablet 650 mg  650 mg Oral Q6H PRN Harriet Butte, NP   650 mg at 10/21/15 6759  . alum & mag hydroxide-simeth (MAALOX/MYLANTA) 200-200-20 MG/5ML suspension 30 mL  30 mL Oral Q4H PRN Harriet Butte, NP      . cloNIDine (CATAPRES) tablet 0.1 mg  0.1  mg Oral QAC breakfast Benjamine Mola, FNP   0.1 mg at 10/21/15 0847  . dicyclomine (BENTYL) tablet 20 mg  20 mg Oral Q6H PRN Benjamine Mola, FNP   20 mg at 10/19/15 0840  . feeding supplement (BOOST / RESOURCE BREEZE) liquid 1 Container  1 Container Oral TID BM Nicholaus Bloom, MD   1 Container at 10/21/15 1538  . FLUoxetine (PROZAC) capsule 10 mg  10 mg Oral Daily Nicholaus Bloom, MD   10 mg at 10/21/15 0844  . hydrOXYzine (ATARAX/VISTARIL) tablet 25 mg  25 mg Oral Q6H PRN Benjamine Mola, FNP      . loperamide (IMODIUM) capsule 2-4 mg  2-4 mg Oral PRN Benjamine Mola, FNP      . magnesium hydroxide (MILK OF MAGNESIA) suspension 30 mL  30 mL Oral Daily PRN Harriet Butte, NP      . methocarbamol (ROBAXIN) tablet 500 mg  500 mg Oral Q8H PRN Benjamine Mola, FNP      . naproxen (NAPROSYN) tablet 500 mg   500 mg Oral BID PRN Benjamine Mola, FNP      . nicotine (NICODERM CQ - dosed in mg/24 hours) patch 21 mg  21 mg Transdermal Daily Nicholaus Bloom, MD   21 mg at 10/21/15 0849  . ondansetron (ZOFRAN-ODT) disintegrating tablet 4 mg  4 mg Oral Q6H PRN Benjamine Mola, FNP   4 mg at 10/21/15 9485  . temazepam (RESTORIL) capsule 30 mg  30 mg Oral QHS PRN Nicholaus Bloom, MD        Lab Results: No results found for this or any previous visit (from the past 48 hour(s)).  Physical Findings: AIMS:  , ,  ,  ,    CIWA:  CIWA-Ar Total: 15 COWS:  COWS Total Score: 4  Musculoskeletal: Strength & Muscle Tone: within normal limits Gait & Station: normal Patient leans: normal  Psychiatric Specialty Exam: Review of Systems  Constitutional: Negative.   HENT: Negative.   Eyes: Negative.   Respiratory: Negative.   Cardiovascular: Negative.   Gastrointestinal: Positive for nausea.  Genitourinary: Negative.   Musculoskeletal: Negative.   Skin: Negative.   Neurological: Negative.   Endo/Heme/Allergies: Negative.   Psychiatric/Behavioral: Positive for depression and substance abuse. The patient is nervous/anxious and has insomnia.     Blood pressure 120/66, pulse 70, temperature 98.4 F (36.9 C), temperature source Oral, resp. rate 16, height '5\' 5"'$  (1.651 m), weight 49.896 kg (110 lb), SpO2 100 %.Body mass index is 18.31 kg/(m^2).  General Appearance: Disheveled  Eye Sport and exercise psychologist::  Fair  Speech:  Clear and Coherent  Volume:  Decreased  Mood:  Anxious and Depressed  Affect:  Restricted  Thought Process:  Coherent and Goal Directed  Orientation:  Full (Time, Place, and Person)  Thought Content:  symptoms events worries concerns  Suicidal Thoughts:  No  Homicidal Thoughts:  No  Memory:  Immediate;   Fair, recent remote; fair  Judgement:  Fair  Insight:  Present  Psychomotor Activity:  Decreased  Concentration:  Fair  Recall:  [employer] of Knowledge:Fair  Language: Fair  Akathisia:  No  Handed:   Right  AIMS (if indicated):     Assets:  Desire for Improvement  ADL's:  Intact  Cognition: WNL  Sleep:  Number of Hours: 6.25   Treatment Plan Summary: Daily contact with patient to assess and evaluate symptoms and progress in treatment and Medication management Supportive approach/coping skills Opioid  dependence; continue to work a relapse prevention plan Depression; continue the Prozac increase to 20 mg daily Insomnia; he states he has used Restoril before prescribed by the [place] will give it a try Use CBT/mindfulness Continue to work to identify residential treatment facilities [facility] A 10/21/2015, 7:31 PM

## 2015-10-21 NOTE — [facility] Suicide Risk Assessment (Signed)
[place] INPATIENT:  Family/Significant Other Suicide Prevention Education  Suicide Prevention Education:  Education Completed; friend Elvina Mattes [phone],  (name of family member/significant other) has been identified by the patient as the family member/significant other with whom the patient will be residing, and identified as the person(s) who will aid the patient in the event of a mental health crisis (suicidal ideations/suicide attempt).  With written consent from the patient, the family member/significant other has been provided the following suicide prevention education, prior to the and/or following the discharge of the patient.  The suicide prevention education provided includes the following:  Suicide risk factors  Suicide prevention and interventions  National Suicide Hotline telephone number  [facility] assessment telephone number  [facility] Emergency Assistance [place] and/or Residential Mobile Crisis Unit telephone number  Request made of family/significant other to:  Remove weapons (e.g., guns, rifles, knives), all items previously/currently identified as safety concern.    Remove drugs/medications (over-the-counter, prescriptions, illicit drugs), all items previously/currently identified as a safety concern.  The family member/significant other verbalizes understanding of the suicide prevention education information provided.  The family member/significant other agrees to remove the items of safety concern listed above.  Jezebel Pollet, Casimiro Needle 10/21/2015, 11:43 AM

## 2015-10-21 NOTE — Progress Notes (Signed)
DAR NOTE: Patient remained in his room most of this shift. Denies auditory and visual hallucinations.  Reports suicidal thoughts on self inventory form but contracts for safety.  Describes energy level as low and concentration as poor.  Rates depression at 8, hopelessness at 8, and anxiety at 7.  Reports withdrawal symptoms of tremors, cravings, agitation, runny nose, chilling, cramping, nausea, and irritability.  Physical symptom of lightheaded,dizziness,headaches and pain reported.  Maintained on routine safety checks.  Medications given as prescribed.  Support and encouragement offered as needed.   States goal for today is "rest."  Patient complain of lower back pain and nausea.  Appetite poor.  Fluid intake encouraged.

## 2015-10-21 NOTE — Progress Notes (Signed)
Patient ID: Gregory Price, male   DOB: 05-Jul-1951, 65 y.o.   MRN: [id number] D: Patient in room reports not feeling well c/o tremors and nausea no vomitting. Pt reports being in bed most of the day not able to keep food down. Pt endorses passive suicidal ideation contracts to not hurt self while here at [facility]. Pt denies HI/AVH and pain. Cooperative with assessment.   A: Medications given for nausea and withdrawal with relief. Support and encouragement provided.  R: Patient remains safe and complaint with medications. Pt ate snack pack cookies and ginger ale this evening.

## 2015-10-21 NOTE — Progress Notes (Signed)
Recreation Therapy Notes  Date: 01.11.2017  Time: 9:30am Location: [address] Group Room   Group Topic: Stress Management  Goal Area(s) Addresses:  Patient will actively participate in stress management techniques presented during session.   Behavioral Response: Did not attend.   Laureen Ochs Felcia Huebert, LRT/CTRS        Fredrik Mogel L 10/21/2015 6:30 PM

## 2015-10-21 NOTE — [facility] Group Notes (Signed)
[facility] LCSW Aftercare Discharge Planning Group Note  10/21/2015  8:45 AM  Participation Quality: Did Not Attend. Patient invited to participate but declined.  Tilden Fossa, MSW, [place] Worker [facility] [phone]

## 2015-10-22 NOTE — Progress Notes (Signed)
Patient ID: Gregory Price, male   DOB: 03-Apr-1951, 65 y.o.   MRN: [id number]   Pt currently presents with a flat affect and isolative behavior. Per self inventory, pt rates depression at a 6, hopelessness 7 and anxiety 7. Pt's daily goal is "staying up." Pt reports fair sleep, a fair appetite, low energy and poor concentration. Pt also complains of dizziness, cravings, agitation and tremors today. Pt affect brighter.   Pt provided with medications per providers orders. Pt's labs and vitals were monitored throughout the day. Pt supported emotionally and encouraged to express concerns and questions. Pt educated on medications.  Pt's safety ensured with 15 minute and environmental checks. Pt currently denies SI/HI and A/V hallucinations. Pt verbally agrees to seek staff if SI/HI or A/VH occurs and to consult with staff before acting on these thoughts. Pt reports not wanting to leave bed states, "I've always been a loner, Maybe I'm just depressed." Pt slept well with sleep meds last night, encouraged to be out of room to help with sleep disturbance. Will continue POC.

## 2015-10-22 NOTE — Plan of Care (Signed)
Problem: Alteration in mood & ability to function due to Goal: STG-Patient will attend groups Outcome: Not Progressing Pt encouraged to engage in milieu and attend groups

## 2015-10-22 NOTE — Progress Notes (Signed)
Patient ID: Gregory Price, male   DOB: April 16, 1951, 65 y.o.   MRN: [id number] D: Patient in room reports feeling slightly better. Pt denies N/V. Pt stated he has been able to keep food down all day. Pt endorses passive suicidal ideation contracts to not hurt self while here at [facility]. Pt denies HI/AVH. Cooperative with assessment.   A: Medications administered as prescribed. Support and encouragement provided to attend groups and engage in milieu.  R: Patient remains safe and complaint with medications.

## 2015-10-22 NOTE — Progress Notes (Signed)
Adult Psychoeducational Group Note  Date:  10/22/2015 Time:  10:40 PM  Group Topic/Focus:  Wrap-Up Group:   The focus of this group is to help patients review their daily goal of treatment and discuss progress on daily workbooks.  Participation Level:  Did Not Attend  Participation Quality:  Did not attend  Affect:  Did not attend  Cognitive:  Did not attend  Insight: None  Engagement in Group:  Did not attend  Modes of Intervention:  Did not attend  Additional Comments:  Patient did not attend wrap up group.   Walter Grima L Kiasha Bellin 10/22/2015, 10:40 PM

## 2015-10-22 NOTE — Tx Team (Signed)
Interdisciplinary Treatment Plan Update (Adult)  Date:  10/22/2015  Time Reviewed:  10:13 AM  Progress in Treatment: Attending groups: No. Participating in groups:  No. Taking medication as prescribed:  Yes. Tolerating medication:  Yes. Family/Significant othe contact made:   Patient understands diagnosis:  Yes. and As evidenced by:  seeking treatment for SI, depression, Heroin abuse, and for medication stabilization. Discussing patient identified problems/goals with staff:  Yes. Medical problems stabilized or resolved:  Yes. Denies suicidal/homicidal ideation: No.Passive SI/able to contract for safety on the unit.  Issues/concerns per patient self-inventory:  Other:  New problem(s) identified:  Pt has not been attending groups and continues to isolate in his room.   Discharge Plan or Barriers:  CSW assessing. [facility]. [facility] referral pending as of 1/10.  Reason for Continuation of Hospitalization: Depression Medication stabilization Suicidal ideation Withdrawal symptoms  Comments:  Gregory Price is an 65 y.o. male, widowed, Caucasian who presents unaccompanied to [facility] requesting treatment for heroin use and also reporting suicidal ideation. Pt states he is seeking treatment at this time "because I am tired of it." He reports using heroin for the past eight years. He states he uses 3-6 bags of heroin intravenously daily. He denies alcohol or other substance use, urine drug screen is pending. He states his last use was one day ago and he has a history of withdrawal symptoms including nausea, vomiting, diarrhea, cramps and fatigue. He states his longest period of clean time is two years. Pt reports his mood is depressed with symptoms including crying spells, social withdrawal, loss of interest in usual pleasures, decreased concentration, decreased sleep, decreased appetite and feelings of guilt and hopelessness. When he was evaluated by the  EDP today he denied suicidal ideation but when he was prepared for discharge he called a suicide hotline. He currently reports suicidal ideation with a plan to jump from an overpass into traffic. Pt denies any history of suicide attempts or para-suicidal behavior. Pt denies any family history of suicide attempts, mental health problems or substance abuse problems. Pt denies homicidal ideation or history of violence. Pt denies any history of psychotic symptoms.Pt reports he is currently homeless. He cannot identify any support but he does have two adult daughters. He is unemployed. He states he has veteran's benefits. Pt denies any current legal problems. He reports he last received treatment for substance abuse at the [facility] in [place] approximately two years ago. He denies any current outpatient providers. He denies any current prescription medications.   Estimated length of stay:  2-3 days   New goal(s): to develop effective aftercare plan.   Additional Comments:  Patient and CSW reviewed pt's identified goals and treatment plan. Patient verbalized understanding and agreed to treatment plan. CSW reviewed [facility] "Discharge Process and Patient Involvement" Form. Pt verbalized understanding of information provided and signed form.    Review of initial/current patient goals per problem list:  1. Goal(s): Patient will participate in aftercare plan  Met: No.   Target date: at discharge  As evidenced by: Patient will participate within aftercare plan AEB aftercare provider and housing plan at discharge being identified.  1/9: CSW assessing for appropriate referrals. Pt did not attend d/c planning group today.  1/12: Patient not attending groups and participating little in discharge planning. He is homeless and considering residential substance abuse treatment.  2. Goal (s): Patient will exhibit decreased depressive symptoms and suicidal ideations.  Met: No.    Target  date: at  discharge  As evidenced by: Patient will utilize self rating of depression at 3 or below and demonstrate decreased signs of depression or be deemed stable for discharge by MD.  1/9: Pt rates depression as high with passive SI/able to contract for safety on the unit.  1/12: Patient continues to isolate and endorses passive SI.  3. Goal(s): Patient will demonstrate decreased signs of withdrawal due to substance abuse  Met:No.   Target date:at discharge   As evidenced by: Patient will produce a CIWA/COWS score of 0, have stable vitals signs, and no symptoms of withdrawal.  1/9: Pt reports moderate withdrawals with COWS of 7 and stable vitals. Goal progressing.  1/12: Goal not met: Pt continues to have withdrawal symptoms of runny nose and body aches and a COWS score of a 2.  Pt to show decrease withdrawal symptoms prior to d/c.    Attendees: Patient:    Family:    Physician: Dr. Parke Poisson; Dr. Sabra Heck 10/22/2015 9:30 AM  Nursing:  Grayland Ormond, Janann August, Malena Catholic., RN 10/22/2015 9:30 AM  Clinical Social Worker: Tilden Fossa, [place] 10/22/2015 9:30 AM  Other: Peri Maris, LCSWA; [place], LCSW  10/22/2015 9:30 AM  Other:  10/22/2015 9:30 AM  Other: Lars Pinks, Case Manager 10/22/2015 9:30 AM  Other: Agustina Caroli, May Augustin , NP 10/22/2015 9:30 AM  Other:      Tilden Fossa, MSW, [place] Worker [facility] [phone]

## 2015-10-22 NOTE — Progress Notes (Signed)
Adult Psychoeducational Group Note  Date:  10/22/2015 Time:  08:55am  Group Topic/Focus:  Self Esteem Action Plan:   The focus of this group is to help patients create a plan to continue to build self-esteem after discharge.  Participation Level:  Did Not Attend  Participation Quality:n/a   Affect: n/a  Cognitive: n/a  Insight: n/a  Engagement in Group: n/a  Modes of Intervention:  Discussion, Education and Support  Additional Comments:  Pt did not attend group, pt in bed asleep.   Elenore Rota 10/22/2015, 9:54 AM

## 2015-10-22 NOTE — Progress Notes (Signed)
[facility] MD Progress Note  10/22/2015 5:30 PM Gregory Price  MRN:  [id number] Subjective:  Gregory Price states he did sleep some more hours last night. He has been less nauseated and able to keep some food in. Still not clear of where he is going to go from here. States he really needs help  Principal Problem: MDD (major depressive disorder), recurrent severe, without psychosis ([place]) Diagnosis:   Patient Active Problem List   Diagnosis Date Noted  . Opioid type dependence, continuous ([place]) [F11.20] 10/19/2015  . MDD (major depressive disorder), recurrent severe, without psychosis ([place]) [F33.2] 10/17/2015  . Opiate withdrawal ([place]) [F11.23] 08/09/2014  . Hepatic steatosis [K76.0] 08/09/2014  . Constipation due to opioid therapy [K59.03, T40.2X5A] 08/09/2014  . Hx of appendectomy [Z90.49] 08/09/2014  . Bilateral renal cysts [Q61.02] 08/09/2014  . Leukocytosis [D72.829] 08/09/2014  . Tobacco abuse [Z72.0] 08/09/2014  . History of hepatitis C [Z86.19] 08/09/2014  . History of COPD [Z87.09] 08/09/2014  . Opioid withdrawal ([place]) [F11.23] 08/09/2014  . Elevated blood pressure [R03.0] 08/09/2014  . Substance induced mood disorder ([place]) [F19.94] 08/07/2014  . Suicidal ideation [R45.851] 08/07/2014   Total Time spent with patient: 20 minutes  Past Psychiatric History: see admission H and P  Past Medical History:  Past Medical History  Diagnosis Date  . Lung cancer ([place])     resolved in 2011  . Hepatitis C   . COPD (chronic obstructive pulmonary disease) [facility])     Past Surgical History  Procedure Laterality Date  . Abdominal surgery      2008  . Lung surgery      2011  . Appendectomy    . Knee surgery      right knee, 1967   Family History:  Family History  Problem Relation Age of Onset  . Cancer Other    Family Psychiatric  History: admission H and P Social History:  History  Alcohol Use No    Comment: former     History  Drug Use  . Yes  . Special: Heroin    Comment: heroin     Social History   Social History  . Marital Status: Legally Separated    Spouse Name: N/Price  . Number of Children: N/Price  . Years of Education: N/Price   Social History Main Topics  . Smoking status: Current Every Day Smoker -- 1.00 packs/day for 15 years    Types: Cigarettes  . Smokeless tobacco: None  . Alcohol Use: No     Comment: former  . Drug Use: Yes    Special: Heroin     Comment: heroin  . Sexual Activity: Not Asked   Other Topics Concern  . None   Social History Narrative   Additional Social History:                         Sleep: Fair  Appetite:  Fair  Current Medications: Current Facility-Administered Medications  Medication Dose Route Frequency Provider Last Rate Last Dose  . acetaminophen (TYLENOL) tablet 650 mg  650 mg Oral Q6H PRN Harriet Butte, NP   650 mg at 10/21/15 3235  . alum & mag hydroxide-simeth (MAALOX/MYLANTA) 200-200-20 MG/5ML suspension 30 mL  30 mL Oral Q4H PRN Harriet Butte, NP      . feeding supplement (BOOST / RESOURCE BREEZE) liquid 1 Container  1 Container Oral TID BM Nicholaus Bloom, MD   1 Container at 10/22/15 1400  . FLUoxetine (PROZAC) capsule  20 mg  20 mg Oral Daily Nicholaus Bloom, MD   20 mg at 10/22/15 3005  . magnesium hydroxide (MILK OF MAGNESIA) suspension 30 mL  30 mL Oral Daily PRN Harriet Butte, NP      . nicotine (NICODERM CQ - dosed in mg/24 hours) patch 21 mg  21 mg Transdermal Daily Nicholaus Bloom, MD   21 mg at 10/22/15 0827  . temazepam (RESTORIL) capsule 30 mg  30 mg Oral QHS PRN Nicholaus Bloom, MD   30 mg at 10/21/15 2127    Lab Results: No results found for this or any previous visit (from the past 48 hour(s)).  Physical Findings: AIMS:  , ,  ,  ,    CIWA:  CIWA-Ar Total: 15 COWS:  COWS Total Score: 2  Musculoskeletal: Strength & Muscle Tone: within normal limits Gait & Station: normal Patient leans: normal  Psychiatric Specialty Exam: Review of Systems  Constitutional: Negative.   HENT: Negative.    Eyes: Negative.   Respiratory: Negative.   Cardiovascular: Negative.   Gastrointestinal: Negative.   Genitourinary: Negative.   Musculoskeletal: Negative.   Skin: Negative.   Neurological: Negative.   Endo/Heme/Allergies: Negative.   Psychiatric/Behavioral: Positive for depression, suicidal ideas and substance abuse. The patient is nervous/anxious.     Blood pressure 135/80, pulse 80, temperature 98.4 F (36.9 C), temperature source Oral, resp. rate 16, height '5\' 5"'$  (1.651 m), weight 49.896 kg (110 lb), SpO2 100 %.Body mass index is 18.31 kg/(m^2).  General Appearance: Disheveled  Eye Sport and exercise psychologist::  Fair  Speech:  Clear and Coherent  Volume:  Normal  Mood:  Euthymic  Affect:  anxious worried  Thought Process:  Coherent and Goal Directed  Orientation:  Full (Time, Place, and Person)  Thought Content:  symptoms events worries concerns  Suicidal Thoughts:  No  Homicidal Thoughts:  No  Memory:  Immediate;   Fair Recent;   Fair Remote;   Fair  Judgement:  Fair  Insight:  Present and Shallow  Psychomotor Activity:  Decreased  Concentration:  Fair  Recall:  [employer] of Knowledge:Fair  Language: Poor  Akathisia:  No  Handed:  Right  AIMS (if indicated):     Assets:  Desire for Improvement  ADL's:  Intact  Cognition: WNL  Sleep:  Number of Hours: 6   Treatment Plan Summary: Daily contact with patient to assess and evaluate symptoms and progress in treatment and Medication management Supportive approach/coping skills Opioid dependence; continue to work Price relapse prevention plan Depression; continue Prozac 20 mg daily Insomnia; continue the Restoril 30 mg HS (he knows if he goes to rehab he will not be able to stay on it) Use CBT/mindfulness Explore residential treatment program Gregory Price 10/22/2015, 5:30 PM

## 2015-10-22 NOTE — [facility] Group Notes (Signed)
[place] LCSW Group Therapy 10/22/2015  1:15 PM   Type of Therapy: Group Therapy  Participation Level: Did Not Attend. Patient invited to participate but declined.   Tilden Fossa, MSW, [place] Worker [facility] [phone]

## 2015-10-23 NOTE — Plan of Care (Signed)
Problem: Alteration in mood & ability to function due to Goal: LTG-Pt reports reduction in suicidal thoughts (Patient reports reduction in suicidal thoughts and is able to verbalize a safety plan for whenever patient is feeling suicidal)  Outcome: Not Progressing Pt Passive SI-but contracts for safety

## 2015-10-23 NOTE — Progress Notes (Signed)
Patient did not attend the evening speaker AA meeting. Pt was notified that group was beginning but remained in bed.   

## 2015-10-23 NOTE — Progress Notes (Signed)
Recreation Therapy Notes  Date: 01.13.2017  Time: 9:30am Location: 300 Group Room   Group Topic: Stress Management  Goal Area(s) Addresses:  Patient will actively participate in stress management techniques presented during session.   Behavioral Response: Did not attend.   Laureen Ochs Nakeshia Waldeck, LRT/CTRS        Lane Hacker 10/23/2015 3:43 PM

## 2015-10-23 NOTE — Progress Notes (Signed)
D: Pt denies SI/HI/AVH. Pt is pleasant and cooperative. Pt stated he was progressing slowly. Pt did walk to the nursing station, but pt continues to have generalized weakness.   A: Pt was offered support and encouragement. Pt was given scheduled medications. Pt was encourage to attend groups. Q 15 minute checks were done for safety. Pt encouraged to drink fluids.   R:Pt is taking medication. Pt has no complaints.Pt receptive to treatment and safety maintained on unit.

## 2015-10-23 NOTE — Progress Notes (Signed)
[facility] MD Progress Note  10/23/2015 6:08 PM Gregory Price  MRN:  [id number] Subjective:  [name] endorses that he is feeling a little better. States that he did sleep better last night, and that he is able to keep food without throwing up. He is still not clear where to go from here Principal Problem: MDD (major depressive disorder), recurrent severe, without psychosis ([place]) Diagnosis:   Patient Active Problem List   Diagnosis Date Noted  . Opioid type dependence, continuous ([place]) [F11.20] 10/19/2015  . MDD (major depressive disorder), recurrent severe, without psychosis ([place]) [F33.2] 10/17/2015  . Opiate withdrawal ([place]) [F11.23] 08/09/2014  . Hepatic steatosis [K76.0] 08/09/2014  . Constipation due to opioid therapy [K59.03, T40.2X5A] 08/09/2014  . Hx of appendectomy [Z90.49] 08/09/2014  . Bilateral renal cysts [Q61.02] 08/09/2014  . Leukocytosis [D72.829] 08/09/2014  . Tobacco abuse [Z72.0] 08/09/2014  . History of hepatitis C [Z86.19] 08/09/2014  . History of COPD [Z87.09] 08/09/2014  . Opioid withdrawal ([place]) [F11.23] 08/09/2014  . Elevated blood pressure [R03.0] 08/09/2014  . Substance induced mood disorder ([place]) [F19.94] 08/07/2014  . Suicidal ideation [R45.851] 08/07/2014   Total Time spent with patient: 15 minutes  Past Psychiatric History: see admission H and P  Past Medical History:  Past Medical History  Diagnosis Date  . Lung cancer ([place])     resolved in 2011  . Hepatitis C   . COPD (chronic obstructive pulmonary disease) [facility])     Past Surgical History  Procedure Laterality Date  . Abdominal surgery      2008  . Lung surgery      2011  . Appendectomy    . Knee surgery      right knee, 1967   Family History:  Family History  Problem Relation Age of Onset  . Cancer Other    Family Psychiatric  History: see admission H and P Social History:  History  Alcohol Use No    Comment: former     History  Drug Use  . Yes  . Special: Heroin    Comment: heroin    Social History   Social History  . Marital Status: Legally Separated    Spouse Name: N/A  . Number of Children: N/A  . Years of Education: N/A   Social History Main Topics  . Smoking status: Current Every Day Smoker -- 1.00 packs/day for 15 years    Types: Cigarettes  . Smokeless tobacco: None  . Alcohol Use: No     Comment: former  . Drug Use: Yes    Special: Heroin     Comment: heroin  . Sexual Activity: Not Asked   Other Topics Concern  . None   Social History Narrative   Additional Social History:                         Sleep: Fair  Appetite:  improved  Current Medications: Current Facility-Administered Medications  Medication Dose Route Frequency Provider Last Rate Last Dose  . acetaminophen (TYLENOL) tablet 650 mg  650 mg Oral Q6H PRN Harriet Butte, NP   650 mg at 10/23/15 0904  . alum & mag hydroxide-simeth (MAALOX/MYLANTA) 200-200-20 MG/5ML suspension 30 mL  30 mL Oral Q4H PRN Harriet Butte, NP      . feeding supplement (BOOST / RESOURCE BREEZE) liquid 1 Container  1 Container Oral TID BM Nicholaus Bloom, MD   1 Container at 10/23/15 1030  . FLUoxetine (PROZAC) capsule 20 mg  20 mg Oral Daily Nicholaus Bloom, MD   20 mg at 10/23/15 0902  . magnesium hydroxide (MILK OF MAGNESIA) suspension 30 mL  30 mL Oral Daily PRN Harriet Butte, NP      . nicotine (NICODERM CQ - dosed in mg/24 hours) patch 21 mg  21 mg Transdermal Daily Nicholaus Bloom, MD   21 mg at 10/23/15 0903  . temazepam (RESTORIL) capsule 30 mg  30 mg Oral QHS PRN Nicholaus Bloom, MD   30 mg at 10/22/15 2157    Lab Results: No results found for this or any previous visit (from the past 48 hour(s)).  Physical Findings: AIMS:  , ,  ,  ,    CIWA:  CIWA-Ar Total: 15 COWS:  COWS Total Score: 11  Musculoskeletal: Strength & Muscle Tone: within normal limits Gait & Station: normal Patient leans: normal  Psychiatric Specialty Exam: Review of Systems  Constitutional: Negative.   HENT:  Negative.   Eyes: Negative.   Respiratory: Negative.   Cardiovascular: Negative.   Gastrointestinal: Positive for nausea.  Genitourinary: Negative.   Musculoskeletal: Negative.   Skin: Negative.   Neurological: Negative.   Endo/Heme/Allergies: Negative.   Psychiatric/Behavioral: Positive for depression and substance abuse.    Blood pressure 106/67, pulse 133, temperature 98.2 F (36.8 C), temperature source Oral, resp. rate 20, height '5\' 5"'$  (1.651 m), weight 49.896 kg (110 lb), SpO2 100 %.Body mass index is 18.31 kg/(m^2).  General Appearance: Disheveled  Eye Sport and exercise psychologist::  Fair  Speech:  Clear and Coherent and Slow  Volume:  Decreased  Mood:  Anxious and worried  Affect:  Restricted  Thought Process:  Coherent and Goal Directed  Orientation:  Full (Time, Place, and Person)  Thought Content:  symptoms events worries concerns  Suicidal Thoughts:  No  Homicidal Thoughts:  No  Memory:  Immediate;   Fair Recent;   Fair Remote;   Fair  Judgement:  Fair  Insight:  Present and Shallow  Psychomotor Activity:  Decreased  Concentration:  Fair  Recall:  [employer] of Knowledge:Fair  Language: Fair  Akathisia:  No  Handed:  Right  AIMS (if indicated):     Assets:  Desire for Improvement  ADL's:  Intact  Cognition: WNL  Sleep:  Number of Hours: 6.25   Treatment Plan Summary: Daily contact with patient to assess and evaluate symptoms and progress in treatment and Medication management Supportive approach/coping skills Opioid dependence; continue to work a relapse prevention plan Depression; continue the Prozac 20 mg daily Insomnia; continue Restoril  30 mg HS PRN sleep Work on identify a residential treatment program that can work with him [name] A 10/23/2015, 6:08 PM

## 2015-10-23 NOTE — Plan of Care (Signed)
Problem: Alteration in mood & ability to function due to Goal: LTG-Pt reports reduction in suicidal thoughts (Patient reports reduction in suicidal thoughts and is able to verbalize a safety plan for whenever patient is feeling suicidal)  Outcome: Progressing Pt denies SI at this time  Problem: Alteration in mood Goal: LTG-Patient reports reduction in suicidal thoughts (Patient reports reduction in suicidal thoughts and is able to verbalize a safety plan for whenever patient is feeling suicidal)  Outcome: Progressing Pt denies Si at this time

## 2015-10-23 NOTE — Progress Notes (Signed)
D: Pt passive SI-contracts for safety. denies HI/AVH. Pt is pleasant and cooperative. Pt stated he felt a little better today. He was still feeling weak, but was able to move around a little better today and getting some of his strength back.   A: Pt was offered support and encouragement. Pt was given scheduled medications. Pt was encourage to attend groups. Q 15 minute checks were done for safety.   R:Pt is taking medication. Pt has no complaints.Pt receptive to treatment and safety maintained on unit.

## 2015-10-24 DIAGNOSIS — F332 Major depressive disorder, recurrent severe without psychotic features: Secondary | ICD-10-CM

## 2015-10-24 NOTE — Progress Notes (Addendum)
Patient ID: Gregory Price, male   DOB: 09-Jun-1951, 65 y.o.   MRN: [id number] [facility] MD Progress Note  10/24/2015 4:20 PM Aldean Pipe  MRN:  [id number]  Subjective:  [name] endorses that he is not feeling good. He says he is feeling weak, sweaty & dizzy. He is lying down in his bed. Did not attend am group meeting. Says he normally go through this when detoxing from heroin. He appears disheveled, shaky & sweaty during this assessment.  Principal Problem: MDD (major depressive disorder), recurrent severe, without psychosis ([place]) Diagnosis:   Patient Active Problem List   Diagnosis Date Noted  . Opioid type dependence, continuous ([place]) [F11.20] 10/19/2015  . MDD (major depressive disorder), recurrent severe, without psychosis ([place]) [F33.2] 10/17/2015  . Opiate withdrawal ([place]) [F11.23] 08/09/2014  . Hepatic steatosis [K76.0] 08/09/2014  . Constipation due to opioid therapy [K59.03, T40.2X5A] 08/09/2014  . Hx of appendectomy [Z90.49] 08/09/2014  . Bilateral renal cysts [Q61.02] 08/09/2014  . Leukocytosis [D72.829] 08/09/2014  . Tobacco abuse [Z72.0] 08/09/2014  . History of hepatitis C [Z86.19] 08/09/2014  . History of COPD [Z87.09] 08/09/2014  . Opioid withdrawal ([place]) [F11.23] 08/09/2014  . Elevated blood pressure [R03.0] 08/09/2014  . Substance induced mood disorder ([place]) [F19.94] 08/07/2014  . Suicidal ideation [R45.851] 08/07/2014   Total Time spent with patient: 15 minutes  Past Psychiatric History: see admission H and P  Past Medical History:  Past Medical History  Diagnosis Date  . Lung cancer ([place])     resolved in 2011  . Hepatitis C   . COPD (chronic obstructive pulmonary disease) [facility])     Past Surgical History  Procedure Laterality Date  . Abdominal surgery      2008  . Lung surgery      2011  . Appendectomy    . Knee surgery      right knee, 1967   Family History:  Family History  Problem Relation Age of Onset  . Cancer Other    Family Psychiatric  History:  see admission H and P Social History:  History  Alcohol Use No    Comment: former     History  Drug Use  . Yes  . Special: Heroin    Comment: heroin    Social History   Social History  . Marital Status: Legally Separated    Spouse Name: N/A  . Number of Children: N/A  . Years of Education: N/A   Social History Main Topics  . Smoking status: Current Every Day Smoker -- 1.00 packs/day for 15 years    Types: Cigarettes  . Smokeless tobacco: None  . Alcohol Use: No     Comment: former  . Drug Use: Yes    Special: Heroin     Comment: heroin  . Sexual Activity: Not Asked   Other Topics Concern  . None   Social History Narrative   Additional Social History:   Sleep: Fair  Appetite:  improved  Current Medications: Current Facility-Administered Medications  Medication Dose Route Frequency Provider Last Rate Last Dose  . acetaminophen (TYLENOL) tablet 650 mg  650 mg Oral Q6H PRN Harriet Butte, NP   650 mg at 10/23/15 0904  . alum & mag hydroxide-simeth (MAALOX/MYLANTA) 200-200-20 MG/5ML suspension 30 mL  30 mL Oral Q4H PRN Harriet Butte, NP      . feeding supplement (BOOST / RESOURCE BREEZE) liquid 1 Container  1 Container Oral TID BM Nicholaus Bloom, MD   1 Container at 10/24/15 1518  .  FLUoxetine (PROZAC) capsule 20 mg  20 mg Oral Daily Nicholaus Bloom, MD   20 mg at 10/24/15 0837  . magnesium hydroxide (MILK OF MAGNESIA) suspension 30 mL  30 mL Oral Daily PRN Harriet Butte, NP      . nicotine (NICODERM CQ - dosed in mg/24 hours) patch 21 mg  21 mg Transdermal Daily Nicholaus Bloom, MD   21 mg at 10/24/15 3354  . temazepam (RESTORIL) capsule 30 mg  30 mg Oral QHS PRN Nicholaus Bloom, MD   30 mg at 10/23/15 2129    Lab Results: No results found for this or any previous visit (from the past 48 hour(s)).  Physical Findings: AIMS:  , ,  ,  ,    CIWA:  CIWA-Ar Total: 15 COWS:  COWS Total Score: 4  Musculoskeletal: Strength & Muscle Tone: within normal limits Gait &  Station: normal Patient leans: normal  Psychiatric Specialty Exam: Review of Systems  Constitutional: Negative.   HENT: Negative.   Eyes: Negative.   Respiratory: Negative.   Cardiovascular: Negative.   Gastrointestinal: Positive for nausea.  Genitourinary: Negative.   Musculoskeletal: Negative.   Skin: Negative.   Neurological: Negative.   Endo/Heme/Allergies: Negative.   Psychiatric/Behavioral: Positive for depression and substance abuse.    Blood pressure 118/63, pulse 76, temperature 98.8 F (37.1 C), temperature source Oral, resp. rate 16, height '5\' 5"'$  (1.651 m), weight 49.896 kg (110 lb), SpO2 100 %.Body mass index is 18.31 kg/(m^2).  General Appearance: Disheveled  Eye Sport and exercise psychologist::  Fair  Speech:  Clear and Coherent and Slow  Volume:  Decreased  Mood:  Anxious and worried  Affect:  Restricted  Thought Process:  Coherent and Goal Directed  Orientation:  Full (Time, Place, and Person)  Thought Content:  symptoms events worries concerns  Suicidal Thoughts:  No  Homicidal Thoughts:  No  Memory:  Immediate;   Fair Recent;   Fair Remote;   Fair  Judgement:  Fair  Insight:  Present and Shallow  Psychomotor Activity:  Restlessness and Tremor  Concentration:  Fair  Recall:  [place]  Language: Fair  Akathisia:  No  Handed:  Right  AIMS (if indicated):     Assets:  Desire for Improvement  ADL's:  Intact  Cognition: WNL  Sleep:  Number of Hours: 5.25   Treatment Plan Summary: Daily contact with patient to assess and evaluate symptoms and progress in treatment and Medication management Supportive approach/coping skills Opioid dependence; continue to work a relapse prevention plan Depression; continue the Prozac 20 mg daily Insomnia; continue Restoril  30 mg HS PRN sleep Work on identify a residential treatment program that can work with him. Social Worker to work on Higher education careers adviser.  Lindell Spar I, PMHNP, FNP-BC 10/24/2015, 4:20 PM

## 2015-10-24 NOTE — Plan of Care (Signed)
Problem: Alteration in mood Goal: LTG-Patient reports reduction in suicidal thoughts (Patient reports reduction in suicidal thoughts and is able to verbalize a safety plan for whenever patient is feeling suicidal)  Outcome: Not Progressing Pt SI but contracts for safety

## 2015-10-24 NOTE — BHH Group Notes (Signed)
[place] Group Notes: (Clinical Social Work)   10/24/2015      Type of Therapy:  Group Therapy   Participation Level:  Did Not Attend despite MHT prompting   Selmer Dominion, LCSW 10/24/2015, 11:16 AM

## 2015-10-24 NOTE — Progress Notes (Signed)
D: Pt  Passive SI-contracts denies HI/AVH. Pt is pleasant and cooperative. Pt concerned that he is having hard time getting better, but is hopeful that he will continue to stay off drugs. Pt wanted to get some help with housing or possibly get into Tx facility to help reduce chances of relapse on D/C. Pt believes "if I  Get off the streets, I may have a better chance staying clean"  A: Pt was offered support and encouragement. Pt was given scheduled medications. Pt was encourage to attend groups. Q 15 minute checks were done for safety.  R: Pt is taking medication. Pt has no complaints.Pt receptive to treatment and safety maintained on unit.

## 2015-10-24 NOTE — Plan of Care (Signed)
Problem: Diagnosis: Increased Risk For Suicide Attempt Goal: STG-Patient Will Attend All Groups On The Unit Outcome: Not Progressing Pt did not attend scheduled groups this shift. Pt stated generalized weakness when prompted. Writer continued to encouraged pt towards group attendance.

## 2015-10-24 NOTE — BHH Group Notes (Signed)
[place] LCSW Group Therapy 10/23/15  1:15 PM Type of Therapy: Group Therapy Participation Level: Active  Participation Quality: Attentive, Sharing and Supportive  Affect: Depressed and Flat  Cognitive: Alert and Oriented  Insight: Developing/Improving and Engaged  Engagement in Therapy: Developing/Improving and Engaged  Modes of Intervention: Clarification, Confrontation, Discussion, Education, Exploration, Limit-setting, Orientation, Problem-solving, Rapport Building, Art therapist, Socialization and Support  Summary of Progress/Problems: The topic for today was feelings about relapse. Pt discussed what relapse prevention is to them and identified triggers that they are on the path to relapse. Pt processed their feeling towards relapse and was able to relate to peers. Pt discussed coping skills that can be used for relapse prevention. Patient participated minimally in discussion but did share that he has nothing to lose anymore and everything to gain by getting into recovery. He identifies an acquaintance who is supportive. CSW and other group members provided patient with emotional support and encouragement.   Tilden Fossa, MSW, [place] Worker [facility] [phone]

## 2015-10-24 NOTE — Progress Notes (Signed)
D: Pt alert in bed reading at present. Stayed in bed for majority of this shift related to generalized weakness. Ambulatory to nursing station X 1 this afternoon for a book; gait is slow and unsteady at times. Fall precaution maintained without incident to note thus far. Pt did not attend groups this shift.  A: Q 15 minutes checks remains effective for pt's safety. All medications administered as scheduled. Fluids encouraged and tolerated well. Continued support and availability offered towards treatment compliance.  R: Pt denies SI, HI, AVH and pain this shift. Reported "I'm getting better but still weak". Compliant with medication regimen. Denies adverse drug reactions when assessed. Remains safe on unit without outburst.

## 2015-10-24 NOTE — Plan of Care (Signed)
Problem: Diagnosis: Increased Risk For Suicide Attempt Goal: STG-Patient Will Comply With Medication Regime Outcome: Progressing Pt compliant with current medication regimen. Denies adverse drug reactions when assessed this shift.

## 2015-10-24 NOTE — Progress Notes (Signed)
Adult Psychoeducational Group Note  Date:  10/24/2015 Time:  8:49 PM  Group Topic/Focus:  Wrap-Up Group:   The focus of this group is to help patients review their daily goal of treatment and discuss progress on daily workbooks.  Participation Level: Did not attend.  Additional Comments:  Pt was in his room speaking with his nurse at the time of group.  Clint Bolder 10/24/2015, 8:49 PM

## 2015-10-24 NOTE — BHH Group Notes (Signed)
[place] Group Notes:  (Nursing/MHT/Case Management/Adjunct)  Date:  10/24/2015  Time:  0930 Type of Therapy:  Nurse Education  Participation Level:  Did Not Attend  Summary of Progress/Problems: Pt did not attend scheduled group despite prompts; stated "I'm weak". Verbal encouragement provided to pt related to compliance with treatment including group attendance.     [name], Nicoletta Dress 10/24/2015, 0930

## 2015-10-25 MED ORDER — GABAPENTIN 100 MG PO CAPS
100.0000 mg | ORAL_CAPSULE | Freq: Every day | ORAL | Status: DC
  Administered 2015-10-25: 100 mg via ORAL
  Filled 2015-10-25 (×3): qty 1
  Filled 2015-10-25: qty 7

## 2015-10-25 NOTE — Progress Notes (Addendum)
Patient ID: Gregory Price, male   DOB: Oct 17, 1950, 65 y.o.   MRN: [id number] Patient ID: Gregory Price, male   DOB: 1951-09-17, 65 y.o.   MRN: [id number] [facility] MD Progress Note  10/25/2015 3:11 PM Gregory Price  MRN:  [id number]  Subjective:  Gregory Price endorses that he is feeling better today & yet weak. He is lying down in his bed just like yesterday. Did not attend am group meetings because he feels light headed.  He appears disheveled & frail. He says he is not sleeping well at night even with Restoril. He says he feels restless at night. Will add Neuronton 100 mg Q hs for agitation/substance withdrawal symptoms.  Principal Problem: MDD (major depressive disorder), recurrent severe, without psychosis ([place]) Diagnosis:   Patient Active Problem List   Diagnosis Date Noted  . Opioid type dependence, continuous ([place]) [F11.20] 10/19/2015  . MDD (major depressive disorder), recurrent severe, without psychosis ([place]) [F33.2] 10/17/2015  . Opiate withdrawal ([place]) [F11.23] 08/09/2014  . Hepatic steatosis [K76.0] 08/09/2014  . Constipation due to opioid therapy [K59.03, T40.2X5A] 08/09/2014  . Hx of appendectomy [Z90.49] 08/09/2014  . Bilateral renal cysts [Q61.02] 08/09/2014  . Leukocytosis [D72.829] 08/09/2014  . Tobacco abuse [Z72.0] 08/09/2014  . History of hepatitis C [Z86.19] 08/09/2014  . History of COPD [Z87.09] 08/09/2014  . Opioid withdrawal ([place]) [F11.23] 08/09/2014  . Elevated blood pressure [R03.0] 08/09/2014  . Substance induced mood disorder ([place]) [F19.94] 08/07/2014  . Suicidal ideation [R45.851] 08/07/2014   Total Time spent with patient: 15 minutes  Past Psychiatric History: see admission H and P  Past Medical History:  Past Medical History  Diagnosis Date  . Lung cancer ([place])     resolved in 2011  . Hepatitis C   . COPD (chronic obstructive pulmonary disease) [facility])     Past Surgical History  Procedure Laterality Date  . Abdominal surgery      2008  . Lung surgery      2011   . Appendectomy    . Knee surgery      right knee, 1967   Family History:  Family History  Problem Relation Age of Onset  . Cancer Other    Family Psychiatric  History: see admission H and P Social History:  History  Alcohol Use No    Comment: former     History  Drug Use  . Yes  . Special: Heroin    Comment: heroin    Social History   Social History  . Marital Status: Legally Separated    Spouse Name: N/A  . Number of Children: N/A  . Years of Education: N/A   Social History Main Topics  . Smoking status: Current Every Day Smoker -- 1.00 packs/day for 15 years    Types: Cigarettes  . Smokeless tobacco: None  . Alcohol Use: No     Comment: former  . Drug Use: Yes    Special: Heroin     Comment: heroin  . Sexual Activity: Not Asked   Other Topics Concern  . None   Social History Narrative   Additional Social History:   Sleep: Fair  Appetite:  improved  Current Medications: Current Facility-Administered Medications  Medication Dose Route Frequency Provider Last Rate Last Dose  . acetaminophen (TYLENOL) tablet 650 mg  650 mg Oral Q6H PRN Harriet Butte, NP   650 mg at 10/24/15 1804  . alum & mag hydroxide-simeth (MAALOX/MYLANTA) 200-200-20 MG/5ML suspension 30 mL  30 mL Oral Q4H PRN Ijeoma E  Danford Bad, NP      . feeding supplement (BOOST / RESOURCE BREEZE) liquid 1 Container  1 Container Oral TID BM Nicholaus Bloom, MD   1 Container at 10/25/15 [phone]  . FLUoxetine (PROZAC) capsule 20 mg  20 mg Oral Daily Nicholaus Bloom, MD   20 mg at 10/25/15 0854  . magnesium hydroxide (MILK OF MAGNESIA) suspension 30 mL  30 mL Oral Daily PRN Harriet Butte, NP      . nicotine (NICODERM CQ - dosed in mg/24 hours) patch 21 mg  21 mg Transdermal Daily Nicholaus Bloom, MD   21 mg at 10/25/15 0854  . temazepam (RESTORIL) capsule 30 mg  30 mg Oral QHS PRN Nicholaus Bloom, MD   30 mg at 10/24/15 2117    Lab Results: No results found for this or any previous visit (from the past 48  hour(s)).  Physical Findings: AIMS:  , ,  ,  ,    CIWA:  CIWA-Ar Total: 15 COWS:  COWS Total Score: 2  Musculoskeletal: Strength & Muscle Tone: within normal limits Gait & Station: normal Patient leans: normal  Psychiatric Specialty Exam: Review of Systems  Constitutional: Negative.   HENT: Negative.   Eyes: Negative.   Respiratory: Negative.   Cardiovascular: Negative.   Gastrointestinal: Positive for nausea.  Genitourinary: Negative.   Musculoskeletal: Negative.   Skin: Negative.   Neurological: Negative.   Endo/Heme/Allergies: Negative.   Psychiatric/Behavioral: Positive for depression and substance abuse.    Blood pressure 108/70, pulse 115, temperature 99.1 F (37.3 C), temperature source Oral, resp. rate 16, height '5\' 5"'$  (1.651 m), weight 49.896 kg (110 lb), SpO2 100 %.Body mass index is 18.31 kg/(m^2).  General Appearance: Disheveled  Eye Sport and exercise psychologist::  Fair  Speech:  Clear and Coherent and Slow  Volume:  Decreased  Mood:  Anxious and worried  Affect:  Restricted  Thought Process:  Coherent and Goal Directed  Orientation:  Full (Time, Place, and Person)  Thought Content:  symptoms events worries concerns  Suicidal Thoughts:  No  Homicidal Thoughts:  No  Memory:  Immediate;   Fair Recent;   Fair Remote;   Fair  Judgement:  Fair  Insight:  Present and Shallow  Psychomotor Activity:  Restlessness and Tremor  Concentration:  Fair  Recall:  [place]  Language: Fair  Akathisia:  No  Handed:  Right  AIMS (if indicated):     Assets:  Desire for Improvement  ADL's:  Intact  Cognition: WNL  Sleep:  Number of Hours: 6.75   Treatment Plan Summary: Daily contact with patient to assess and evaluate symptoms and progress in treatment and Medication management Supportive approach/coping skills Opioid dependence; continue to work a relapse prevention plan Depression; continue the Prozac 20 mg daily Insomnia; continue Restoril  30 mg HS PRN sleep,  add Neurontin 100 mg Q hs for agitation/substance withdrawal symptoms. Work on identify a residential treatment program that can work with him. Social Worker to work on Higher education careers adviser.  Lindell Spar I, PMHNP, FNP-BC 10/25/2015, 3:11 PM

## 2015-10-25 NOTE — Progress Notes (Signed)
D-  Patient has spent the majority of the shift in his room resting.  Patient complained of feeling weak.  Patient has no complaints of SI, HI or AVH.  Patient still complains of withdrawal symptoms but states that they are decreasing.    A- Assess patient for safety, assess patient for signs and symptoms of withdrawal, offer medications as prescribed,   R-  Patient was able to contract for safety, patient remained free of severe withdrawal symptoms.

## 2015-10-25 NOTE — BHH Group Notes (Signed)
[place] Group Notes: (Clinical Social Work)   10/25/2015      Type of Therapy:  Group Therapy   Participation Level:  Did Not Attend despite MHT prompting   Selmer Dominion, LCSW 10/25/2015, 1:11 PM

## 2015-10-25 NOTE — BHH Group Notes (Signed)
[place] Group Notes:  (Nursing/MHT/Case Management/Adjunct)  Date:  10/25/2015  Time:  1400  Type of Therapy:  Nurse Education - Healthy Support Systems  Participation Level:  Did not Attend  Participation Quality:    Affect:    Cognitive:    Insight:    Engagement in Group:    Modes of Intervention:    Summary of Progress/Problems: Was invited however elected not to attend.  Jamie Kato 10/25/2015, 3:02 PM

## 2015-10-25 NOTE — Progress Notes (Signed)
Assumed care of patient at 2330.  Pt is currently resting in room with eyes closed.  Respirations are even and unlabored.  Pt does not appear to be in any distress.  Will continue to monitor and assess.

## 2015-10-25 NOTE — Progress Notes (Signed)
D- Patient is depressed and anxious with a flat affect.  Patient denies SI/HI/AVH and pain.  Patient has c/o increased anxiety and reports that he feels "weak".  Patient provided with fluids.  On reassessment, patient reports that he is feeling "better".  No other complaints.   A- Scheduled medications administered to patient, per MD orders. Support and encouragement provided.  Routine safety checks conducted every 15 minutes.  Patient informed to notify staff with problems or concerns. R- No adverse drug reactions noted. Patient contracts for safety at this time. Patient compliant with medications and treatment plan. Patient receptive, calm, and cooperative.  Patient remains safe at this time.

## 2015-10-25 NOTE — Progress Notes (Signed)
Patient did not attend the evening speaker AA meeting. Pt was notified that group was beginning but remained in bed.   

## 2015-10-26 MED ORDER — FLUOXETINE HCL 20 MG PO CAPS: 20.0000 mg | ORAL_CAPSULE | Freq: Every day | ORAL | Status: DC

## 2015-10-26 MED ORDER — NICOTINE 21 MG/24HR TD PT24: 21.0000 mg | MEDICATED_PATCH | Freq: Every day | TRANSDERMAL | Status: DC

## 2015-10-26 MED ORDER — TEMAZEPAM 30 MG PO CAPS: 30.0000 mg | ORAL_CAPSULE | Freq: Every evening | ORAL | Status: DC | PRN

## 2015-10-26 MED ORDER — GABAPENTIN 100 MG PO CAPS: 100.0000 mg | ORAL_CAPSULE | Freq: Every day | ORAL | Status: DC

## 2015-10-26 NOTE — Progress Notes (Addendum)
D:  Patient's self inventory sheet, patient has fair sleep, sleep medication is helpful.  Good appetite, low energy level, poor concentration.  Rated depression and hopeless 7, anxiety 8.  Withdrawals, cravings, agitation, runny nose, irritability.  Patient denied SI and HI this morning.  Denied A/V hallucinations.  Contracts for safety.  No discharge plans.  A:  Medications administered per MD orders.  Emotional support and encouragement given patient. R:  Safety maintained with 15 minute checks.

## 2015-10-26 NOTE — Progress Notes (Signed)
  [facility] Adult Case Management Discharge Plan :  Will you be returning to the same living situation after discharge:  No. Patient plans to discharge to [facility] At discharge, do you have transportation home?: Yes,  [facility] to provide transportation Do you have the ability to pay for your medications: Yes,  patient will be provided with prescriptions at discharge  Release of information consent forms completed and in the chart;  Patient's signature needed at discharge.  Patient to Follow up at: Follow-up Information    Follow up with [facility] On 10/26/2015.   Why:  Please go today for continued treatment.   Contact information:   [place]  [phone]      Next level of care provider has access to [place] and Suicide Prevention discussed: Yes,  with patient and friend  Have you used any form of tobacco in the last 30 days? (Cigarettes, Smokeless Tobacco, Cigars, and/or Pipes): Yes  Has patient been referred to the Quitline?: Patient refused referral  Patient has been referred for addiction treatment: Yes  Emmanuel Gruenhagen, Casimiro Needle 10/26/2015, 3:56 PM

## 2015-10-26 NOTE — BHH Suicide Risk Assessment (Signed)
[facility] Discharge Suicide Risk Assessment   Demographic Factors:  Male and Caucasian  Total Time spent with patient: 15 minutes  Musculoskeletal: Strength & Muscle Tone: within normal limits Gait & Station: normal Patient leans: normal  Psychiatric Specialty Exam: Physical Exam  Review of Systems  Constitutional: Negative.   HENT: Negative.   Eyes: Negative.   Respiratory: Negative.   Cardiovascular: Negative.   Gastrointestinal: Negative.   Genitourinary: Negative.   Musculoskeletal: Negative.   Skin: Negative.   Neurological: Negative.   Endo/Heme/Allergies: Negative.   Psychiatric/Behavioral: Positive for depression and substance abuse.    Blood pressure 123/84, pulse 103, temperature 98.3 F (36.8 C), temperature source Oral, resp. rate 21, height '5\' 5"'$  (1.651 m), weight 49.896 kg (110 lb), SpO2 100 %.Body mass index is 18.31 kg/(m^2).  General Appearance: Fairly Groomed  Engineer, water::  Fair  Speech:  Clear and [id number]  Volume:  Decreased  Mood:  Anxious and worried  Affect:  Appropriate  Thought Process:  Coherent and Goal Directed  Orientation:  Full (Time, Place, and Person)  Thought Content:  symptomse events worries concerns  Suicidal Thoughts:  No  Homicidal Thoughts:  No  Memory:  Immediate;   Fair Recent;   Fair Remote;   Fair  Judgement:  Fair  Insight:  Shallow  Psychomotor Activity:  Decreased  Concentration:  Fair  Recall:  [employer] of Knowledge:Fair  Language: Fair  Akathisia:  No  Handed:  Right  AIMS (if indicated):     Assets:  Desire for Improvement  Sleep:  Number of Hours: 6  Cognition: WNL  ADL's:  Intact   Have you used any form of tobacco in the last 30 days? (Cigarettes, Smokeless Tobacco, Cigars, and/or Pipes): Yes  Has this patient used any form of tobacco in the last 30 days? (Cigarettes, Smokeless Tobacco, Cigars, and/or Pipes) Yes, A prescription for an FDA-approved tobacco cessation medication was offered at discharge and  the patient refused  Mental Status Per Nursing Assessment::   On Admission:     Current Mental Status by Physician: In full contact with reality. There are no active SI plans or intent. There are no active S/S of withdrawal. He is willing and motivated to pursue further residential treatment at [facility].    Loss Factors: Decline in physical health  Historical Factors: NA  Risk Reduction Factors:   Positive social support  Continued Clinical Symptoms:  Depression:   Comorbid alcohol abuse/dependence Alcohol/Substance Abuse/Dependencies  Cognitive Features That Contribute To Risk:  Closed-mindedness, Polarized thinking and Thought constriction (tunnel vision)    Suicide Risk:  Minimal: No identifiable suicidal ideation.  Patients presenting with no risk factors but with morbid ruminations; may be classified as minimal risk based on the severity of the depressive symptoms  Principal Problem: MDD (major depressive disorder), recurrent severe, without psychosis [facility]) Discharge Diagnoses:  Patient Active Problem List   Diagnosis Date Noted  . Opioid type dependence, continuous ([place]) [F11.20] 10/19/2015  . MDD (major depressive disorder), recurrent severe, without psychosis ([place]) [F33.2] 10/17/2015  . Opiate withdrawal ([place]) [F11.23] 08/09/2014  . Hepatic steatosis [K76.0] 08/09/2014  . Constipation due to opioid therapy [K59.03, T40.2X5A] 08/09/2014  . Hx of appendectomy [Z90.49] 08/09/2014  . Bilateral renal cysts [Q61.02] 08/09/2014  . Leukocytosis [D72.829] 08/09/2014  . Tobacco abuse [Z72.0] 08/09/2014  . History of hepatitis C [Z86.19] 08/09/2014  . History of COPD [Z87.09] 08/09/2014  . Opioid withdrawal ([place]) [F11.23] 08/09/2014  . Elevated blood pressure [R03.0] 08/09/2014  .  Substance induced mood disorder ([place]) [F19.94] 08/07/2014  . Suicidal ideation [R45.851] 08/07/2014      Plan Of Care/Follow-up recommendations:  Activity:  as tolerated Diet:  regular Follow  up [facility] Is patient on multiple antipsychotic therapies at discharge:  No   Has Patient had three or more failed trials of antipsychotic monotherapy by history:  No  Recommended Plan for Multiple Antipsychotic Therapies: NA    Ivalene Platte A 10/26/2015, 12:25 PM

## 2015-10-26 NOTE — Progress Notes (Signed)
Discharge Note:  Patient discharged to [facility].  Denied SI and HI.  Denied A/V hallucinations.  Suicide prevention information given and discussed with patient who stated he understood and had no questions  Patient stated he received all his belongings, clothing, toiletries, misc items, prescriptions, medications.  Patient stated he appreciated all assistance received from [facility] staff.

## 2015-10-26 NOTE — BHH Group Notes (Signed)
[facility] LCSW Aftercare Discharge Planning Group Note  10/26/2015  8:45 AM  Participation Quality: Did Not Attend. Patient invited to participate but declined.  Tilden Fossa, MSW, [place] Worker [facility] [phone]

## 2015-10-26 NOTE — Progress Notes (Signed)
Recreation Therapy Notes  Date: 01.16.2017 Time: 9:30am Location: 300 Hall Group Room  Group Topic: Stress Management  Goal Area(s) Addresses:  Patient will actively participate in stress management techniques presented during session.   Behavioral Response: Did not attend.   Laureen Ochs Zenda Herskowitz, LRT/CTRS        Gricel Copen L 10/26/2015 11:53 AM

## 2015-10-26 NOTE — Tx Team (Signed)
Interdisciplinary Treatment Plan Update (Adult)  Date:  10/26/2015  Time Reviewed:  3:48 PM  Progress in Treatment: Attending groups: No. Participating in groups:  No. Taking medication as prescribed:  Yes. Tolerating medication:  Yes. Family/Significant othe contact made:  Yes, CSW has spoken with patient's friend Patient understands diagnosis:  Yes. and As evidenced by:  seeking treatment for SI, depression, Heroin abuse, and for medication stabilization. Discussing patient identified problems/goals with staff:  Yes. Medical problems stabilized or resolved:  Yes. Denies suicidal/homicidal ideation: Yes, denies Issues/concerns per patient self-inventory:  Other:  New problem(s) identified:    Discharge Plan or Barriers:  Patient plans to discharge to [facility] for continued treatment.  Reason for Continuation of Hospitalization: Depression Medication stabilization Suicidal ideation Withdrawal symptoms  Comments:  Gregory Price is an 65 y.o. male, widowed, Caucasian who presents unaccompanied [facility] requesting treatment for heroin use and also reporting suicidal ideation. Pt states he is seeking treatment at this time "because I am tired of it." He reports using heroin for the past eight years. He states he uses 3-6 bags of heroin intravenously daily. He denies alcohol or other substance use, urine drug screen is pending. He states his last use was one day ago and he has a history of withdrawal symptoms including nausea, vomiting, diarrhea, cramps and fatigue. He states his longest period of clean time is two years. Pt reports his mood is depressed with symptoms including crying spells, social withdrawal, loss of interest in usual pleasures, decreased concentration, decreased sleep, decreased appetite and feelings of guilt and hopelessness. When he was evaluated by the EDP today he denied suicidal ideation but when he was prepared for discharge he called a suicide hotline. He  currently reports suicidal ideation with a plan to jump from an overpass into traffic. Pt denies any history of suicide attempts or para-suicidal behavior. Pt denies any family history of suicide attempts, mental health problems or substance abuse problems. Pt denies homicidal ideation or history of violence. Pt denies any history of psychotic symptoms.Pt reports he is currently homeless. He cannot identify any support but he does have two adult daughters. He is unemployed. He states he has veteran's benefits. Pt denies any current legal problems. He reports he last received treatment for substance abuse at the [facility] in [place] approximately two years ago. He denies any current outpatient providers. He denies any current prescription medications.   Estimated length of stay: Discharge anticipated for 10/26/15  New goal(s): to develop effective aftercare plan.   Additional Comments:  Patient and CSW reviewed pt's identified goals and treatment plan. Patient verbalized understanding and agreed to treatment plan. CSW reviewed [facility] "Discharge Process and Patient Involvement" Form. Pt verbalized understanding of information provided and signed form.    Review of initial/current patient goals per problem list:  1. Goal(s): Patient will participate in aftercare plan  Met: Yes  Target date: at discharge  As evidenced by: Patient will participate within aftercare plan AEB aftercare provider and housing plan at discharge being identified.  1/9: CSW assessing for appropriate referrals. Pt did not attend d/c planning group today.  1/12: Patient not attending groups and participating little in discharge planning. He is homeless and considering residential substance abuse treatment. 1/16: Goal met. Patient plans to discharge to [facility] for continued treatment.  2. Goal (s): Patient will exhibit decreased depressive symptoms and suicidal ideations.  AST:[id number] for discharge per MD   Target date:  at discharge  As evidenced by: Patient  will utilize self rating of depression at 3 or below and demonstrate decreased signs of depression or be deemed stable for discharge by MD.  1/9: Pt rates depression as high with passive SI/able to contract for safety on the unit.  1/12: Patient continues to isolate and endorses passive SI. 1/16: Adequate for discharge. Patient reports baseline levels of depression, denies SI.   3. Goal(s): Patient will demonstrate decreased signs of withdrawal due to substance abuse  KHT:XHFSFSEL for discharge per MD  Target date:at discharge   As evidenced by: Patient will produce a CIWA/COWS score of 0, have stable vitals signs, and no symptoms of withdrawal.  1/9: Pt reports moderate withdrawals with COWS of 7 and stable vitals. Goal progressing.  1/12: Goal not met: Pt continues to have withdrawal symptoms of runny nose and body aches and a COWS score of a 2.  Pt to show decrease withdrawal symptoms prior to d/c. 1/16: Adequate for discharge per MD. Patient with COWS score of 5, CIWA of 4 experiencing anxiety, tremor, sweating but reports feeling stable for continued treatment at [facility].    Attendees: Patient:    Family:    Physician: Dr. Parke Poisson; Dr. Sabra Heck 10/26/2015 9:30 AM  Nursing: Darrol Angel, Christa Forrestine Him, RN 10/26/2015 9:30 AM  Clinical Social Worker: Tilden Fossa, [place] 10/26/2015 9:30 AM  Other: Peri Maris, LCSWA; [place], LCSW  10/26/2015 9:30 AM  Other:  10/26/2015 9:30 AM  Other:  10/26/2015 9:30 AM  Other: Fransico Him NP 10/26/2015 9:30 AM           Tilden Fossa, MSW, [place] Worker [facility] [phone]

## 2015-10-26 NOTE — Plan of Care (Signed)
Problem: Consults Goal: Depression Patient Education See Patient Education Module for education specifics.  Outcome: Progressing Nurse discussed depression/coping skills with patient.        

## 2015-10-26 NOTE — Discharge Summary (Signed)
Physician Discharge Summary Note  Patient:  Gregory Price is an 65 y.o., male MRN:  [id number] DOB:  06-19-1951 Patient phone:  [phone] (home)  Patient address:   [address] [facility] [place] [address],  Total Time spent with patient: 45 minutes  Date of Admission:  10/16/2015 Date of Discharge: 10/26/2015  Reason for Admission:   Gregory Price is an 65 y.o. male, widowed, Caucasian who presents unaccompanied [facility] requesting treatment for heroin use and also reporting suicidal ideation. Pt states he is seeking treatment at this time "because I am tired of it." He reports using heroin for the past eight years. He states he uses 3-6 bags of heroin intravenously daily. He denies alcohol or other substance use, urine drug screen is pending. He states his last use was one day ago and he has a history of withdrawal symptoms including nausea, vomiting, diarrhea, cramps and fatigue. He states his longest period of clean time is two years.   Pt reports his mood is depressed with symptoms including crying spells, social withdrawal, loss of interest in usual pleasures, decreased concentration, decreased sleep, decreased appetite and feelings of guilt and hopelessness. When he was evaluated by the EDP today he denied suicidal ideation but when he was prepared for discharge he called a suicide hotline. He currently reports suicidal ideation with a plan to jump from an overpass into traffic. Pt denies any history of suicide attempts or para-suicidal behavior. Pt denies any family history of suicide attempts, mental health problems or substance abuse problems. Pt denies homicidal ideation or history of violence. Pt denies any history of psychotic symptoms.  Pt reports he is currently homeless. He cannot identify any support but he does have two adult daughters. He is unemployed. He states he has veteran's benefits. Pt denies any current legal problems. He reports he last received treatment for substance  abuse at the [facility] in [place] approximately two years ago. He denies any current outpatient providers. He denies any current prescription medications.  Pt is dressed in hospital scrubs, alert, oriented x4 with normal speech and normal motor behavior. Eye contact is fair. Pt's mood is depressed and affect is depressed and irritable. Thought process is coherent and relevant. There is no indication Pt is currently responding to internal stimuli or experiencing delusional thought content. Pt was generally cooperative and gave brief responses to all questions. He will agree to voluntary inpatient treatment.  On Evaluation: Gregory Price is awake, alert and oriented X4 , found resting in bedroom. Endorsed suicidal Ideation with a plan to jump from a bridge. Patient dose contract for safety and will inform staff of thoughts/intent. Denies auditory or visual hallucination and does not appear to be responding to internal stimuli. Patient interacts well with staff and others.States his depression 10/10. Patient states "I am feeling not well today. Patient reports using heroin often and is experiencing. H/A, and body aches from withdrawals." Reports Poor appetite and resting okay. Patient report she is excited regarding discharge. Support, encouragement and reassurance was provided.   Principal Problem: MDD (major depressive disorder), recurrent severe, without psychosis [facility]) Discharge Diagnoses: Patient Active Problem List   Diagnosis Date Noted  . MDD (major depressive disorder), recurrent severe, without psychosis ([place]) [F33.2] 10/17/2015    Priority: High  . Substance induced mood disorder ([place]) [F19.94] 08/07/2014    Priority: High  . Opioid type dependence, continuous ([place]) [F11.20] 10/19/2015  . Opiate withdrawal ([place]) [F11.23] 08/09/2014  . Hepatic steatosis [K76.0] 08/09/2014  .  Constipation due to opioid therapy [K59.03, T40.2X5A] 08/09/2014  . Hx of appendectomy [Z90.49] 08/09/2014   . Bilateral renal cysts [Q61.02] 08/09/2014  . Leukocytosis [D72.829] 08/09/2014  . Tobacco abuse [Z72.0] 08/09/2014  . History of hepatitis C [Z86.19] 08/09/2014  . History of COPD [Z87.09] 08/09/2014  . Opioid withdrawal ([place]) [F11.23] 08/09/2014  . Elevated blood pressure [R03.0] 08/09/2014  . Suicidal ideation [R45.851] 08/07/2014    Past Psychiatric History: See H&P  Past Medical History:  Past Medical History  Diagnosis Date  . Lung cancer ([place])     resolved in 2011  . Hepatitis C   . COPD (chronic obstructive pulmonary disease) [facility])     Past Surgical History  Procedure Laterality Date  . Abdominal surgery      2008  . Lung surgery      2011  . Appendectomy    . Knee surgery      right knee, 1967   Family History:  Family History  Problem Relation Age of Onset  . Cancer Other    Family Psychiatric  History: See H&P Social History:  History  Alcohol Use No    Comment: former     History  Drug Use  . Yes  . Special: Heroin    Comment: heroin    Social History   Social History  . Marital Status: Legally Separated    Spouse Name: N/A  . Number of Children: N/A  . Years of Education: N/A   Social History Main Topics  . Smoking status: Current Every Day Smoker -- 1.00 packs/day for 15 years    Types: Cigarettes  . Smokeless tobacco: None  . Alcohol Use: No     Comment: former  . Drug Use: Yes    Special: Heroin     Comment: heroin  . Sexual Activity: Not Asked   Other Topics Concern  . None   Social History Narrative    Hospital Course:   Gregory Price was admitted for MDD (major depressive disorder), recurrent severe, without psychosis ([place]) , with crisis management.  Pt was treated discharged with the medications listed below under Medication List.  Medical problems were identified and treated as needed.  Home medications were restarted as appropriate.  Improvement was monitored by observation and Gregory Price 's daily report of symptom  reduction.  Emotional and mental status was monitored by daily self-inventory reports completed by Gregory Price and clinical staff.         Gregory Price was evaluated by the treatment team for stability and plans for continued recovery upon discharge. Gregory Price 's motivation was an integral factor for scheduling further treatment. Employment, transportation, bed availability, health status, family support, and any pending legal issues were also considered during hospital stay. Pt was offered further treatment options upon discharge including but not limited to Residential, Intensive Outpatient, and Outpatient treatment.  Gregory Price will follow up with the services as listed below under Follow Up Information.     Upon completion of this admission the patient was both mentally and medically stable for discharge denying suicidal/homicidal ideation, auditory/visual/tactile hallucinations, delusional thoughts and paranoia.    Gregory Price responded well to treatment with prozac, neurontin, nicotine, and restoril without adverse effects. Pt demonstrated improvement without reported or observed adverse effects to the point of stability appropriate for outpatient management. Pertinent labs include: UDS + cocaine, opiates. Reviewed CBC, CMP, BAL, and UDS; all unremarkable aside from noted exceptions.   Physical Findings: AIMS: Facial and Oral Movements  Muscles of Facial Expression: None, normal Lips and Perioral Area: None, normal Jaw: None, normal Tongue: None, normal,Extremity Movements Upper (arms, wrists, hands, fingers): None, normal Lower (legs, knees, ankles, toes): None, normal, Trunk Movements Neck, shoulders, hips: None, normal, Overall Severity Severity of abnormal movements (highest score from questions above): None, normal Incapacitation due to abnormal movements: None, normal Patient's awareness of abnormal movements (rate only patient's report): No Awareness, Dental Status Current  problems with teeth and/or dentures?: No Does patient usually wear dentures?: No  CIWA:  CIWA-Ar Total: 4 COWS:  COWS Total Score: 5  Musculoskeletal: Strength & Muscle Tone: within normal limits Gait & Station: normal Patient leans: N/A  Psychiatric Specialty Exam: Review of Systems  Psychiatric/Behavioral: Positive for depression. Negative for suicidal ideas and hallucinations. The patient is nervous/anxious and has insomnia.   All other systems reviewed and are negative.   Blood pressure 122/85, pulse 89, temperature 98.3 F (36.8 C), temperature source Oral, resp. rate 21, height '5\' 5"'$  (1.651 m), weight 49.896 kg (110 lb), SpO2 100 %.Body mass index is 18.31 kg/(m^2).  SEE MD PSE within the SRA    Have you used any form of tobacco in the last 30 days? (Cigarettes, Smokeless Tobacco, Cigars, and/or Pipes): Yes  Has this patient used any form of tobacco in the last 30 days? (Cigarettes, Smokeless Tobacco, Cigars, and/or Pipes) Yes, Yes, A prescription for an FDA-approved tobacco cessation medication was offered at discharge and the patient refused  Metabolic Disorder Labs:  No results found for: HGBA1C, MPG No results found for: PROLACTIN No results found for: CHOL, TRIG, HDL, CHOLHDL, VLDL, LDLCALC  See Psychiatric Specialty Exam and Suicide Risk Assessment completed by Attending Physician prior to discharge.  Discharge destination:  [facility]  Is patient on multiple antipsychotic therapies at discharge:  No   Has Patient had three or more failed trials of antipsychotic monotherapy by history:  No  Recommended Plan for Multiple Antipsychotic Therapies: NA     Medication List    STOP taking these medications        hydrALAZINE 10 MG tablet  Commonly known as:  APRESOLINE     mirtazapine 7.5 MG tablet  Commonly known as:  REMERON      TAKE these medications      Indication   FLUoxetine 20 MG capsule  Commonly known as:  PROZAC  Take 1 capsule (20 mg total) by mouth  daily.   Indication:  Depression     gabapentin 100 MG capsule  Commonly known as:  NEURONTIN  Take 1 capsule (100 mg total) by mouth at bedtime.   Indication:  mood stabilization     nicotine 21 mg/24hr patch  Commonly known as:  NICODERM CQ - dosed in mg/24 hours  Place 1 patch (21 mg total) onto the skin daily.   Indication:  Nicotine Addiction     temazepam 30 MG capsule  Commonly known as:  RESTORIL  Take 1 capsule (30 mg total) by mouth at bedtime as needed for sleep.   Indication:  Trouble Sleeping        Follow-up recommendations:  Activity:  As tolerated Diet:  Heart healthy with low sodium  Comments:   Take all medications as prescribed. Keep all follow-up appointments as scheduled.  Do not consume alcohol or use illegal drugs while on prescription medications. Report any adverse effects from your medications to your primary care provider promptly.  In the event of recurrent symptoms or worsening symptoms, call 911, a crisis  hotline, or go to the nearest emergency department for evaluation.   Signed: Benjamine Mola, FNP-BC 10/26/2015, 11:19 AM  I personally assessed the patient and formulated the plan Geralyn Flash A. Sabra Heck, M.D.

## 2017-07-07 ENCOUNTER — Emergency Department (HOSPITAL_COMMUNITY): Payer: Medicare Other

## 2017-07-07 ENCOUNTER — Inpatient Hospital Stay (HOSPITAL_COMMUNITY)
Admission: EM | Admit: 2017-07-07 | Discharge: 2017-07-11 | DRG: 552 | Disposition: A | Discharge status: Home / Self Care | Payer: Medicare Other | Attending: Internal Medicine | Admitting: Internal Medicine

## 2017-07-07 ENCOUNTER — Encounter (HOSPITAL_COMMUNITY): Payer: Self-pay | Admitting: Emergency Medicine

## 2017-07-07 DIAGNOSIS — S22000A Wedge compression fracture of unspecified thoracic vertebra, initial encounter for closed fracture: Secondary | ICD-10-CM | POA: Diagnosis present

## 2017-07-07 DIAGNOSIS — F1410 Cocaine abuse, uncomplicated: Secondary | ICD-10-CM | POA: Diagnosis present

## 2017-07-07 DIAGNOSIS — D179 Benign lipomatous neoplasm, unspecified: Secondary | ICD-10-CM | POA: Diagnosis present

## 2017-07-07 DIAGNOSIS — K760 Fatty (change of) liver, not elsewhere classified: Secondary | ICD-10-CM | POA: Diagnosis present

## 2017-07-07 DIAGNOSIS — K5900 Constipation, unspecified: Secondary | ICD-10-CM | POA: Diagnosis present

## 2017-07-07 DIAGNOSIS — F1990 Other psychoactive substance use, unspecified, uncomplicated: Secondary | ICD-10-CM | POA: Diagnosis not present

## 2017-07-07 DIAGNOSIS — F1994 Other psychoactive substance use, unspecified with psychoactive substance-induced mood disorder: Secondary | ICD-10-CM | POA: Diagnosis not present

## 2017-07-07 DIAGNOSIS — M4624 Osteomyelitis of vertebra, thoracic region: Secondary | ICD-10-CM | POA: Diagnosis present

## 2017-07-07 DIAGNOSIS — Z720 Tobacco use: Secondary | ICD-10-CM | POA: Diagnosis not present

## 2017-07-07 DIAGNOSIS — F1911 Other psychoactive substance abuse, in remission: Secondary | ICD-10-CM

## 2017-07-07 DIAGNOSIS — Z590 Homelessness: Secondary | ICD-10-CM

## 2017-07-07 DIAGNOSIS — Z8619 Personal history of other infectious and parasitic diseases: Secondary | ICD-10-CM | POA: Diagnosis not present

## 2017-07-07 DIAGNOSIS — R7881 Bacteremia: Secondary | ICD-10-CM

## 2017-07-07 DIAGNOSIS — Z91030 Bee allergy status: Secondary | ICD-10-CM | POA: Diagnosis not present

## 2017-07-07 DIAGNOSIS — M549 Dorsalgia, unspecified: Secondary | ICD-10-CM | POA: Diagnosis present

## 2017-07-07 DIAGNOSIS — F1110 Opioid abuse, uncomplicated: Secondary | ICD-10-CM

## 2017-07-07 DIAGNOSIS — J449 Chronic obstructive pulmonary disease, unspecified: Secondary | ICD-10-CM | POA: Insufficient documentation

## 2017-07-07 DIAGNOSIS — Z23 Encounter for immunization: Secondary | ICD-10-CM | POA: Diagnosis present

## 2017-07-07 DIAGNOSIS — M4854XA Collapsed vertebra, not elsewhere classified, thoracic region, initial encounter for fracture: Secondary | ICD-10-CM | POA: Diagnosis present

## 2017-07-07 DIAGNOSIS — B9689 Other specified bacterial agents as the cause of diseases classified elsewhere: Secondary | ICD-10-CM | POA: Diagnosis present

## 2017-07-07 DIAGNOSIS — Z87898 Personal history of other specified conditions: Secondary | ICD-10-CM

## 2017-07-07 DIAGNOSIS — M4644 Discitis, unspecified, thoracic region: Secondary | ICD-10-CM | POA: Diagnosis present

## 2017-07-07 DIAGNOSIS — M4622 Osteomyelitis of vertebra, cervical region: Secondary | ICD-10-CM | POA: Diagnosis not present

## 2017-07-07 DIAGNOSIS — E876 Hypokalemia: Secondary | ICD-10-CM | POA: Diagnosis not present

## 2017-07-07 DIAGNOSIS — Z79899 Other long term (current) drug therapy: Secondary | ICD-10-CM

## 2017-07-07 DIAGNOSIS — M869 Osteomyelitis, unspecified: Secondary | ICD-10-CM | POA: Diagnosis not present

## 2017-07-07 DIAGNOSIS — F332 Major depressive disorder, recurrent severe without psychotic features: Secondary | ICD-10-CM | POA: Diagnosis not present

## 2017-07-07 DIAGNOSIS — M4620 Osteomyelitis of vertebra, site unspecified: Secondary | ICD-10-CM

## 2017-07-07 DIAGNOSIS — F17210 Nicotine dependence, cigarettes, uncomplicated: Secondary | ICD-10-CM | POA: Diagnosis present

## 2017-07-07 DIAGNOSIS — R001 Bradycardia, unspecified: Secondary | ICD-10-CM | POA: Diagnosis not present

## 2017-07-07 DIAGNOSIS — I10 Essential (primary) hypertension: Secondary | ICD-10-CM | POA: Diagnosis present

## 2017-07-07 DIAGNOSIS — A488 Other specified bacterial diseases: Secondary | ICD-10-CM

## 2017-07-07 DIAGNOSIS — Z8709 Personal history of other diseases of the respiratory system: Secondary | ICD-10-CM

## 2017-07-07 DIAGNOSIS — M546 Pain in thoracic spine: Secondary | ICD-10-CM | POA: Diagnosis not present

## 2017-07-07 DIAGNOSIS — S22000G Wedge compression fracture of unspecified thoracic vertebra, subsequent encounter for fracture with delayed healing: Secondary | ICD-10-CM | POA: Diagnosis not present

## 2017-07-07 HISTORY — DX: Wedge compression fracture of unspecified thoracic vertebra, initial encounter for closed fracture: S22.000A

## 2017-07-07 LAB — APTT: APTT: 38 s — AB (ref 24–36)

## 2017-07-07 LAB — URINALYSIS, ROUTINE W REFLEX MICROSCOPIC
BACTERIA UA: NONE SEEN
Bilirubin Urine: NEGATIVE
Glucose, UA: NEGATIVE mg/dL
HGB URINE DIPSTICK: NEGATIVE
KETONES UR: NEGATIVE mg/dL
Leukocytes, UA: NEGATIVE
Nitrite: NEGATIVE
PROTEIN: NEGATIVE mg/dL
SPECIFIC GRAVITY, URINE: 1.004 — AB (ref 1.005–1.030)
Squamous Epithelial / LPF: NONE SEEN
pH: 7 (ref 5.0–8.0)

## 2017-07-07 LAB — BASIC METABOLIC PANEL
ANION GAP: 11 (ref 5–15)
BUN: 9 mg/dL (ref 6–20)
CALCIUM: 8.8 mg/dL — AB (ref 8.9–10.3)
CO2: 24 mmol/L (ref 22–32)
CREATININE: 0.75 mg/dL (ref 0.61–1.24)
Chloride: 105 mmol/L (ref 101–111)
GFR calc Af Amer: >60 mL/min (ref >60)
Glucose, Bld: 107 mg/dL — ABNORMAL HIGH (ref 65–99)
Potassium: 3.2 mmol/L — ABNORMAL LOW (ref 3.5–5.1)
Sodium: 140 mmol/L (ref 135–145)

## 2017-07-07 LAB — CBC
HCT: 34.4 % — ABNORMAL LOW (ref 39.0–52.0)
Hemoglobin: 11.2 g/dL — ABNORMAL LOW (ref 13.0–17.0)
MCH: 27.7 pg (ref 26.0–34.0)
MCHC: 32.6 g/dL (ref 30.0–36.0)
MCV: 84.9 fL (ref 78.0–100.0)
PLATELETS: 265 10*3/uL (ref 150–400)
RBC: 4.05 MIL/uL — ABNORMAL LOW (ref 4.22–5.81)
RDW: 13.7 % (ref 11.5–15.5)
WBC: 11.7 10*3/uL — ABNORMAL HIGH (ref 4.0–10.5)

## 2017-07-07 LAB — MAGNESIUM: Magnesium: 1.6 mg/dL — ABNORMAL LOW (ref 1.7–2.4)

## 2017-07-07 LAB — PHOSPHORUS: PHOSPHORUS: 2.8 mg/dL (ref 2.5–4.6)

## 2017-07-07 LAB — I-STAT TROPONIN, ED: TROPONIN I, POC: 0 ng/mL (ref 0.00–0.08)

## 2017-07-07 LAB — PROTIME-INR
INR: 1.1
PROTHROMBIN TIME: 14.1 s (ref 11.4–15.2)

## 2017-07-07 LAB — TSH: TSH: 0.214 u[IU]/mL — AB (ref 0.350–4.500)

## 2017-07-07 MED ORDER — LORAZEPAM 2 MG/ML IJ SOLN
1.0000 mg | Freq: Once | INTRAMUSCULAR | Status: AC
  Administered 2017-07-07: 1 mg via INTRAVENOUS
  Filled 2017-07-07: qty 1

## 2017-07-07 MED ORDER — NICOTINE 21 MG/24HR TD PT24
21.0000 mg | MEDICATED_PATCH | Freq: Every day | TRANSDERMAL | Status: DC
  Administered 2017-07-08 – 2017-07-11 (×4): 21 mg via TRANSDERMAL
  Filled 2017-07-07 (×4): qty 1

## 2017-07-07 MED ORDER — HYDRALAZINE HCL 20 MG/ML IJ SOLN
5.0000 mg | INTRAMUSCULAR | Status: DC | PRN
  Administered 2017-07-07: 5 mg via INTRAVENOUS
  Filled 2017-07-07: qty 1

## 2017-07-07 MED ORDER — ADULT MULTIVITAMIN W/MINERALS CH
1.0000 | ORAL_TABLET | Freq: Every day | ORAL | Status: DC
  Administered 2017-07-08 – 2017-07-11 (×4): 1 via ORAL
  Filled 2017-07-07 (×4): qty 1

## 2017-07-07 MED ORDER — FOLIC ACID 1 MG PO TABS
1.0000 mg | ORAL_TABLET | Freq: Every day | ORAL | Status: DC
  Administered 2017-07-08 – 2017-07-11 (×4): 1 mg via ORAL
  Filled 2017-07-07 (×4): qty 1

## 2017-07-07 MED ORDER — VANCOMYCIN HCL IN DEXTROSE 750-5 MG/150ML-% IV SOLN
750.0000 mg | Freq: Two times a day (BID) | INTRAVENOUS | Status: DC
  Administered 2017-07-08 – 2017-07-09 (×3): 750 mg via INTRAVENOUS
  Filled 2017-07-07 (×5): qty 150

## 2017-07-07 MED ORDER — THIAMINE HCL 100 MG/ML IJ SOLN: 100.0000 mg | Freq: Every day | INTRAMUSCULAR | Status: DC

## 2017-07-07 MED ORDER — HYDRALAZINE HCL 50 MG PO TABS
50.0000 mg | ORAL_TABLET | Freq: Three times a day (TID) | ORAL | Status: DC
  Administered 2017-07-07 – 2017-07-11 (×12): 50 mg via ORAL
  Filled 2017-07-07: qty 1
  Filled 2017-07-07: qty 2
  Filled 2017-07-07 (×10): qty 1

## 2017-07-07 MED ORDER — ONDANSETRON HCL 4 MG/2ML IJ SOLN: 4.0000 mg | Freq: Four times a day (QID) | INTRAMUSCULAR | Status: DC | PRN

## 2017-07-07 MED ORDER — HYDROCODONE-ACETAMINOPHEN 5-325 MG PO TABS
1.0000 | ORAL_TABLET | ORAL | Status: DC | PRN
  Administered 2017-07-07 – 2017-07-11 (×16): 2 via ORAL
  Administered 2017-07-11: 1 via ORAL
  Administered 2017-07-11: 2 via ORAL
  Administered 2017-07-11: 1 via ORAL
  Filled 2017-07-07 (×11): qty 2
  Filled 2017-07-07: qty 1
  Filled 2017-07-07 (×7): qty 2
  Filled 2017-07-07: qty 1

## 2017-07-07 MED ORDER — HYDROMORPHONE HCL 1 MG/ML IJ SOLN
2.0000 mg | Freq: Once | INTRAMUSCULAR | Status: AC
  Administered 2017-07-07: 2 mg via INTRAVENOUS
  Filled 2017-07-07: qty 2

## 2017-07-07 MED ORDER — TEMAZEPAM 30 MG PO CAPS: 30.0000 mg | ORAL_CAPSULE | Freq: Every evening | ORAL | Status: DC | PRN

## 2017-07-07 MED ORDER — CYCLOBENZAPRINE HCL 10 MG PO TABS
5.0000 mg | ORAL_TABLET | Freq: Once | ORAL | Status: AC
  Administered 2017-07-07: 5 mg via ORAL
  Filled 2017-07-07: qty 1

## 2017-07-07 MED ORDER — PROMETHAZINE HCL 25 MG/ML IJ SOLN: 12.5000 mg | Freq: Four times a day (QID) | INTRAMUSCULAR | Status: DC | PRN

## 2017-07-07 MED ORDER — HEPARIN SODIUM (PORCINE) 5000 UNIT/ML IJ SOLN
5000.0000 [IU] | Freq: Three times a day (TID) | INTRAMUSCULAR | Status: DC
  Administered 2017-07-07 – 2017-07-11 (×12): 5000 [IU] via SUBCUTANEOUS
  Filled 2017-07-07 (×12): qty 1

## 2017-07-07 MED ORDER — KETOROLAC TROMETHAMINE 15 MG/ML IJ SOLN
15.0000 mg | Freq: Four times a day (QID) | INTRAMUSCULAR | Status: DC | PRN
  Administered 2017-07-09 – 2017-07-11 (×4): 15 mg via INTRAVENOUS
  Filled 2017-07-07 (×5): qty 1

## 2017-07-07 MED ORDER — VANCOMYCIN HCL 10 G IV SOLR
1500.0000 mg | Freq: Once | INTRAVENOUS | Status: DC
  Administered 2017-07-07: 1500 mg via INTRAVENOUS
  Filled 2017-07-07: qty 1500

## 2017-07-07 MED ORDER — POLYETHYLENE GLYCOL 3350 17 G PO PACK: 17.0000 g | PACK | Freq: Every day | ORAL | Status: DC | PRN

## 2017-07-07 MED ORDER — LORAZEPAM 1 MG PO TABS
1.0000 mg | ORAL_TABLET | Freq: Four times a day (QID) | ORAL | Status: AC | PRN
  Administered 2017-07-07 – 2017-07-09 (×3): 1 mg via ORAL
  Filled 2017-07-07 (×3): qty 1

## 2017-07-07 MED ORDER — HYDROMORPHONE HCL 1 MG/ML IJ SOLN: 1.0000 mg | INTRAMUSCULAR | Status: DC | PRN

## 2017-07-07 MED ORDER — MAGNESIUM HYDROXIDE 400 MG/5ML PO SUSP
30.0000 mL | Freq: Every day | ORAL | Status: AC
  Administered 2017-07-08: 30 mL via ORAL
  Filled 2017-07-07: qty 30

## 2017-07-07 MED ORDER — CEFTRIAXONE SODIUM 2 G IJ SOLR
2.0000 g | Freq: Once | INTRAMUSCULAR | Status: DC
  Administered 2017-07-07: 2 g via INTRAVENOUS
  Filled 2017-07-07: qty 2

## 2017-07-07 MED ORDER — VITAMIN B-1 100 MG PO TABS
100.0000 mg | ORAL_TABLET | Freq: Every day | ORAL | Status: DC
  Administered 2017-07-08 – 2017-07-11 (×4): 100 mg via ORAL
  Filled 2017-07-07 (×4): qty 1

## 2017-07-07 MED ORDER — POTASSIUM CHLORIDE CRYS ER 20 MEQ PO TBCR
40.0000 meq | EXTENDED_RELEASE_TABLET | Freq: Once | ORAL | Status: AC
  Administered 2017-07-07: 40 meq via ORAL
  Filled 2017-07-07: qty 2

## 2017-07-07 MED ORDER — LORAZEPAM 2 MG/ML IJ SOLN: 1.0000 mg | Freq: Four times a day (QID) | INTRAMUSCULAR | Status: AC | PRN

## 2017-07-07 MED ORDER — FLUOXETINE HCL 20 MG PO CAPS
20.0000 mg | ORAL_CAPSULE | Freq: Every day | ORAL | Status: DC
  Administered 2017-07-08 – 2017-07-11 (×4): 20 mg via ORAL
  Filled 2017-07-07 (×4): qty 1

## 2017-07-07 MED ORDER — ZOLPIDEM TARTRATE 5 MG PO TABS
5.0000 mg | ORAL_TABLET | Freq: Every evening | ORAL | Status: DC | PRN
  Administered 2017-07-08: 5 mg via ORAL
  Filled 2017-07-07: qty 1

## 2017-07-07 MED ORDER — CEFTRIAXONE SODIUM 2 G IJ SOLR
2.0000 g | INTRAMUSCULAR | Status: DC
  Administered 2017-07-08 – 2017-07-10 (×3): 2 g via INTRAVENOUS
  Filled 2017-07-07 (×4): qty 2

## 2017-07-07 MED ORDER — ONDANSETRON HCL 4 MG PO TABS: 4.0000 mg | ORAL_TABLET | Freq: Four times a day (QID) | ORAL | Status: DC | PRN

## 2017-07-07 MED ORDER — HYDROMORPHONE HCL 1 MG/ML IJ SOLN
1.0000 mg | Freq: Once | INTRAMUSCULAR | Status: AC
  Administered 2017-07-07: 1 mg via INTRAVENOUS
  Filled 2017-07-07: qty 1

## 2017-07-07 MED ORDER — ACETAMINOPHEN 325 MG PO TABS: 650.0000 mg | ORAL_TABLET | Freq: Four times a day (QID) | ORAL | Status: DC | PRN

## 2017-07-07 MED ORDER — LORAZEPAM 1 MG PO TABS
0.0000 mg | ORAL_TABLET | Freq: Two times a day (BID) | ORAL | Status: AC
  Administered 2017-07-10: 1 mg via ORAL
  Filled 2017-07-07: qty 1

## 2017-07-07 MED ORDER — GABAPENTIN 100 MG PO CAPS
100.0000 mg | ORAL_CAPSULE | Freq: Every day | ORAL | Status: DC
  Administered 2017-07-08 – 2017-07-10 (×4): 100 mg via ORAL
  Filled 2017-07-07 (×4): qty 1

## 2017-07-07 MED ORDER — ACETAMINOPHEN 650 MG RE SUPP: 650.0000 mg | Freq: Four times a day (QID) | RECTAL | Status: DC | PRN

## 2017-07-07 MED ORDER — LORAZEPAM 1 MG PO TABS
0.0000 mg | ORAL_TABLET | Freq: Four times a day (QID) | ORAL | Status: AC
  Administered 2017-07-08 – 2017-07-09 (×2): 1 mg via ORAL
  Filled 2017-07-07 (×3): qty 1

## 2017-07-07 MED ORDER — SODIUM CHLORIDE 0.9 % IV SOLN
INTRAVENOUS | Status: AC
  Administered 2017-07-07 – 2017-07-08 (×2): via INTRAVENOUS

## 2017-07-07 NOTE — H&P (Signed)
History and Physical    Gregory Price JOA:[id number] DOB: 02-13-1951 DOA: 07/07/2017  PCP: System, Provider Not In Patient coming from: [facility]  Chief Complaint: back pain  HPI: Gregory Price is a 66 y.o. male with medical history significant for daily IV heroin use, tobacco use, COPD, hepatitis presents to the emergency Department chief complaint persistent worsening back pain. Initial evaluation includes an MRI revealing T5 compression fracture as well as extensive edema throughout the T5 and T6 vertebral bodies and intervening disc signal is concerning for infectious discitis/osteomyelitis. [facility] are asked to admit.  Patient is obtained from the patient. He describes a three-week history of persistent worsening mid thoracic spine pain. He describes the pain as constant pressure and sharp like worsening with movement and breathing. He states he has been taking ibuprofen every 3 hours or so with little improvement. He denies any recent trauma. He denies chest pain palpitation shortness of breath. He denies headache dizziness syncope or near-syncope. He denies any abdominal pain nausea vomiting diarrhea. He does endorse persistent long-standing constipation. Is unsure when his last bowel movement was. He denies dysuria hematuria frequency or urgency. He is a daily IV heroin user and injects approximately 3 times a day   ED Course: In the emergency department she's afebrile hemodynamically stable with a blood pressure at the high end of normal mildly bradycardic. He is nontoxic appearing. He is not hypoxic. He is provided with IV fluids and IV antibiotics. He is also provided with analgesia  Review of Systems: As per HPI otherwise all other systems reviewed and are negative.   Ambulatory Status: Ambulates independently and is independent with ADLs no recent falls  Past Medical History:  Diagnosis Date  . Compression fracture of thoracic vertebra (HCC)   . COPD (chronic  obstructive pulmonary disease) ([place])   . Hepatitis C   . Lung cancer ([place])    resolved in 2011    Past Surgical History:  Procedure Laterality Date  . ABDOMINAL SURGERY     2008  . APPENDECTOMY    . KNEE SURGERY     right knee, 1967  . LUNG SURGERY     2011    Social History   Social History  . Marital status: Legally Separated    Spouse name: N/A  . Number of children: N/A  . Years of education: N/A   Occupational History  . Not on file.   Social History Main Topics  . Smoking status: Current Every Day Smoker    Packs/day: 1.00    Years: 15.00    Types: Cigarettes  . Smokeless tobacco: Never Used  . Alcohol use No     Comment: former  . Drug use: Yes    Types: Heroin     Comment: heroin  . Sexual activity: Not on file   Other Topics Concern  . Not on file   Social History Narrative  . No narrative on file    Allergies  Allergen Reactions  . Bee Venom Anaphylaxis    Family History  Problem Relation Age of Onset  . Cancer Other     Prior to Admission medications   Medication Sig Start Date End Date Taking? Authorizing Provider  FLUoxetine (PROZAC) 20 MG capsule Take 1 capsule (20 mg total) by mouth daily. 10/26/15   Withrow, Elyse Jarvis, FNP  gabapentin (NEURONTIN) 100 MG capsule Take 1 capsule (100 mg total) by mouth at bedtime. 10/26/15   Withrow, Elyse Jarvis, FNP  nicotine (NICODERM CQ -  DOSED IN MG/24 HOURS) 21 mg/24hr patch Place 1 patch (21 mg total) onto the skin daily. 10/26/15   Withrow, Elyse Jarvis, FNP  temazepam (RESTORIL) 30 MG capsule Take 1 capsule (30 mg total) by mouth at bedtime as needed for sleep. 10/26/15   Benjamine Mola, FNP    Physical Exam: Vitals:   07/07/17 1206 07/07/17 1215 07/07/17 1300 07/07/17 1400  BP:   (!) 169/60 (!) 181/74  Pulse: (!) 48 (!) 49 (!) 48 (!) 50  Resp: 19 (!) 26 19 16   Temp:      TempSrc:      SpO2: 96% 96% 98% 97%  Weight:      Height:         General:  Appears calm and comfortable In no acute  distress Eyes:  PERRL, EOMI, normal lids, iris ENT:  grossly normal hearing, lips & tongue, mucous membranes of his mouth are pink slightly dry very poor dentition Neck:  no LAD, masses or thyromegaly Cardiovascular:  RRR, no m/r/g. No LE edema. Pedal pulses present and palpable Respiratory:  Normal effort breath sounds with diffuse coarseness I hear no crackles no wheeze Abdomen:  soft, ntnd, NABS Skin:  no rash or induration seen on limited exam Musculoskeletal:  grossly normal tone BUE/BLE, good ROM, no bony abnormality reposition self in bed without problem. Thoracic spine nontender to palpation. Psychiatric:  grossly normal mood and affect, speech fluent and appropriate, AOx3 Neurologic:  CN 2-12 grossly intact, moves all extremities in coordinated fashion, sensation intact and oriented 3 bilateral lower extremity strength 5 out of 5. Bilateral upper extremity strength 5 out of 5  Labs on Admission: I have personally reviewed following labs and imaging studies  CBC:  Recent Labs Lab 07/07/17 0811  WBC 11.7*  HGB 11.2*  HCT 34.4*  MCV 84.9  PLT 314   Basic Metabolic Panel:  Recent Labs Lab 07/07/17 0811  NA 140  K 3.2*  CL 105  CO2 24  GLUCOSE 107*  BUN 9  CREATININE 0.75  CALCIUM 8.8*   GFR: Estimated Creatinine Clearance: 82.7 mL/min (by C-G formula based on SCr of 0.75 mg/dL). Liver Function Tests: No results for input(s): AST, ALT, ALKPHOS, BILITOT, PROT, ALBUMIN in the last 168 hours. No results for input(s): LIPASE, AMYLASE in the last 168 hours. No results for input(s): AMMONIA in the last 168 hours. Coagulation Profile: No results for input(s): INR, PROTIME in the last 168 hours. Cardiac Enzymes: No results for input(s): CKTOTAL, CKMB, CKMBINDEX, TROPONINI in the last 168 hours. BNP (last 3 results) No results for input(s): PROBNP in the last 8760 hours. HbA1C: No results for input(s): HGBA1C in the last 72 hours. CBG: No results for input(s): GLUCAP  in the last 168 hours. Lipid Profile: No results for input(s): CHOL, HDL, LDLCALC, TRIG, CHOLHDL, LDLDIRECT in the last 72 hours. Thyroid Function Tests: No results for input(s): TSH, T4TOTAL, FREET4, T3FREE, THYROIDAB in the last 72 hours. Anemia Panel: No results for input(s): VITAMINB12, FOLATE, FERRITIN, TIBC, IRON, RETICCTPCT in the last 72 hours. Urine analysis:    Component Value Date/Time   COLORURINE YELLOW 08/09/2014 1519   APPEARANCEUR CLOUDY (A) 08/09/2014 1519   LABSPEC >1.046 (H) 08/09/2014 1519   PHURINE 7.5 08/09/2014 1519   GLUCOSEU NEGATIVE 08/09/2014 1519   HGBUR NEGATIVE 08/09/2014 1519   BILIRUBINUR NEGATIVE 08/09/2014 1519   KETONESUR NEGATIVE 08/09/2014 1519   PROTEINUR 30 (A) 08/09/2014 1519   UROBILINOGEN 1.0 08/09/2014 1519   NITRITE NEGATIVE 08/09/2014  [place] 08/09/2014 1519    Creatinine Clearance: Estimated Creatinine Clearance: 82.7 mL/min (by C-G formula based on SCr of 0.75 mg/dL).  Sepsis Labs: @LABRCNTIP (procalcitonin:4,lacticidven:4) )No results found for this or any previous visit (from the past 240 hour(s)).   Radiological Exams on Admission: Dg Chest 2 View  Result Date: 07/07/2017 CLINICAL DATA:  Chest pain.  Back pain . EXAM: CHEST  2 VIEW COMPARISON:  CT 08/09/2014. FINDINGS: Mediastinum and hilar structures normal periods mild cardiomegaly. No pulmonary venous congestion. No focal infiltrate. Mild right base pleural thickening noted most likely secondary to scarring. Small right pleural effusion cannot be excluded. Surgical clips upper chest. IMPRESSION: 1. Right base mild pleural thickening noted most consistent scarring. Small right pleural effusion cannot be excluded. No focal infiltrate noted. 2.  Cardiomegaly.  No pulmonary venous congestion . Electronically Signed   By: Marcello Moores  Register   On: 07/07/2017 07:35   Mr Thoracic Spine Wo Contrast  Result Date: 07/07/2017 CLINICAL DATA:  Thoracic back pain. IV drug  use. Concern for epidural abscess. EXAM: MRI THORACIC SPINE WITHOUT CONTRAST TECHNIQUE: Multiplanar, multisequence MR imaging of the thoracic spine was performed. No intravenous contrast was administered. COMPARISON:  Chest radiographs 07/07/2017 FINDINGS: Due to patient pain, the study is motion degraded despite medication and multiple repeated imaging attempts. Alignment: Exaggerated thoracic kyphosis centered at T5-6. No listhesis. Vertebrae: There is edema diffusely throughout the T5 and T6 vertebral bodies. There is anterior wedging of the T5 vertebral body with approximately 70% height loss. Slight anterior wedging is noted of the T3, T4, T6, and T7 vertebral bodies. There is abnormal fluid signal in the T5-6 disc space with edema in the surrounding paravertebral soft tissues. There may be trace ventral epidural phlegmon or hematoma at T5-6, however assessment is limited by motion artifact and no sizable epidural fluid collection is seen to indicate drainable abscess. Cord: Normal signal and morphology within limitations of motion artifact. Paraspinal and other soft tissues: Lipoma within the posterior left paraspinal musculature from C5-T3 measuring approximately 7 cm in length an approximately 3 x 3 cm transversely. Trace bilateral pleural effusions. Disc levels: Disc degeneration at C5-6 and C6-7 with moderate disc space narrowing, disc bulging, and spurring, only imaged sagittally and incompletely evaluated though without high-grade spinal stenosis or frank cord compression. No sizable disc herniation or spinal stenosis in the thoracic spine. Mild thoracic facet arthrosis without evidence of significant neural foraminal stenosis. IMPRESSION: 1. Moderate to severe T5 compression fracture deformity. Extensive edema throughout the T5 and T6 vertebral bodies and intervening disc signal is concerning for infectious discitis/osteomyelitis given the clinical history and surrounding paravertebral soft tissue  edema. While there is slight wedging of the T6 vertebral body, purely posttraumatic edema without infection is felt less likely. No evidence of epidural abscess. 2. Large lipoma in the left posterior paraspinal cervicothoracic musculature. Electronically Signed   By: Logan Bores M.D.   On: 07/07/2017 13:12    EKG: Independently reviewed. SR with PVC  Assessment/Plan Principal Problem:   Compression fracture of thoracic vertebra (HCC) Active Problems:   Substance induced mood disorder (HCC)   Hepatic steatosis   Tobacco abuse   History of hepatitis C   History of COPD   MDD (major depressive disorder), recurrent severe, without psychosis ([place])   IVDU (intravenous drug user)   Back pain   Lipoma   Hypokalemia   Bradycardia   #1. Compression fracture of thoracic spine/back pain. MRI as noted above. Concern for discitis/osteomyelitis  given his current IV drug usage. Currently afebrile nontoxic appearing. Mild leukocytosis but I think this is reactive.  Neurosurgery consulted who opined unable to perform intervention at this time and recommended infectious disease consult. Patient is provided with Rocephin and vancomycin in the emergency department. Neurosurgery will follow per ED provider -Admit -Supportive therapy -Physical therapy -Antibiotics per infectious disease. Will not likely be candidate for long term OP IV antibiotics given #4.  -follow blood cultures -follow HIV -Hep C per ID -IR consult  #2. Bradycardia. Heart rate range 47-52. Chart review indicates this is his baseline. EKG as noted above. -Monitor on telemetry -Repeat EKG -Obtain a TSH  #3. Hypokalemia. Likely related to decreased oral intake. Mild. -Replete -Recheck  #4. Current IV drug user. Patient reports injecting Helen 3-4 times a day. Most recent injection last evening. -ciwa protocol with scheduled ativan as well as prn -social work -anti-emetic  -IV fluids  5. Hypertension. Only fair control in the  emergency department. Likely related to pain in the setting of early withdrawal. Will avoid clonidine given tendency for bradycardia -Scheduled hydralazine by mouth -When necessary hydralazine with parameters -monitor  #6. COPD. Not on home oxygen. Continues to smoke. Chest x-ray right base mild pleural thickening noted most consistent scarring. Small right pleural effusion cannot be excluded. No focal infiltrate noted. Oxygen saturation level greater than 90% on room air -nicoderm patch   DVT prophylaxis: heparin  Code Status: full  Family Communication: none present  Disposition Plan: home  Consults called: hatcher ID, ID, neurosurgery  Admission status: inpatient    Radene Gunning MD [facility]  If 7PM-7AM, please contact night-coverage [email or web address] Password [email or web address]  07/07/2017, 2:40 PM

## 2017-07-07 NOTE — ED Notes (Signed)
2nd call received from MRI. Pt still not tolerating scan. Dr. Vanita Panda updated. New orders received.

## 2017-07-07 NOTE — Progress Notes (Signed)
Appreciate IR eval.  Will restart anbx Await BCx.

## 2017-07-07 NOTE — ED Notes (Signed)
INR recollected

## 2017-07-07 NOTE — Progress Notes (Signed)
Pharmacy Antibiotic Note  Gregory Price is a 66 y.o. male admitted on 07/07/2017 with thoracic back pain. MRI of back shows T5 fracture with osteomyelitis. Patient received first dose of vancomycin and ceftriaxone in the ED.  SCr 0.7, eCrCl > 80 ml/min.  Plan: -Vancomycin 1500 mg IV x1 then 750 mg IV q12h -Monitor renal fx, cultures, obtain VT at Css    Height: 5\' 6"  (167.6 cm) Weight: 140 lb (63.5 kg) IBW/kg (Calculated) : 63.8  Temp (24hrs), Avg:98.3 F (36.8 C), Min:98.3 F (36.8 C), Max:98.3 F (36.8 C)   Recent Labs Lab 07/07/17 0811  WBC 11.7*  CREATININE 0.75    Estimated Creatinine Clearance: 82.7 mL/min (by C-G formula based on SCr of 0.75 mg/dL).    Allergies  Allergen Reactions  . Bee Venom Anaphylaxis    Antimicrobials this admission: 9/28 ceftriaxone > 9/28 vancomycin >  Dose adjustments this admission: N/A  Microbiology results: 9/28 blood cx: 9/28 urine cx:  Harvel Quale 07/07/2017 4:41 PM

## 2017-07-07 NOTE — ED Provider Notes (Signed)
[facility] DEPT Provider Note   CSN: [id number] Arrival date & time: 07/07/17  5885     History   Chief Complaint Chief Complaint  Patient presents with  . Chest Pain  . Back Pain    HPI Gregory Price is a 66 y.o. male.  66 year old male history of daily IV heroin use, tobacco abuse, MDD, and appendctomy who p/w 3 weeks of thoracic back pain radiating to chest.  Describes constant sharp midline thoracic back pain radiating to chest.  Unable to sleep comfortably last evening. Taking ibuprofen every 3 hours without improvement. Denies hematemesis or hematochezia . Pain worsens with position changing. Denies recent trauma. Current IV heroin use, injects 3-4x per day. Denies previous MI. No recent fevers or cough.  The history is provided by the patient and medical records. No language interpreter was used.    Past Medical History:  Diagnosis Date  . Compression fracture of thoracic vertebra (HCC)   . COPD (chronic obstructive pulmonary disease) ([place])   . Hepatitis C   . Lung cancer ([place])    resolved in 2011    Patient Active Problem List   Diagnosis Date Noted  . Compression fracture of thoracic vertebra ([place]) 07/07/2017  . IVDU (intravenous drug user) 07/07/2017  . Back pain 07/07/2017  . Lipoma 07/07/2017  . Hypokalemia 07/07/2017  . Bradycardia 07/07/2017  . COPD (chronic obstructive pulmonary disease) ([place])   . Mid-back pain, acute   . Opioid type dependence, continuous ([place]) 10/19/2015  . MDD (major depressive disorder), recurrent severe, without psychosis ([place]) 10/17/2015  . Opiate withdrawal ([place]) 08/09/2014  . Hepatic steatosis 08/09/2014  . Constipation due to opioid therapy 08/09/2014  . Hx of appendectomy 08/09/2014  . Bilateral renal cysts 08/09/2014  . Leukocytosis 08/09/2014  . Tobacco abuse 08/09/2014  . History of hepatitis C 08/09/2014  . History of COPD 08/09/2014  . Opioid withdrawal ([place]) 08/09/2014  . Elevated blood pressure 08/09/2014  .  Substance induced mood disorder ([place]) 08/07/2014  . Suicidal ideation 08/07/2014    Past Surgical History:  Procedure Laterality Date  . ABDOMINAL SURGERY     2008  . APPENDECTOMY    . KNEE SURGERY     right knee, 1967  . LUNG SURGERY     2011       Home Medications    Prior to Admission medications   Medication Sig Start Date End Date Taking? Authorizing Provider  ibuprofen (ADVIL,MOTRIN) 200 MG tablet Take 1,000 mg by mouth every 6 (six) hours as needed (for pain).   Yes [provider]    Family History Family History  Problem Relation Age of Onset  . Cancer Other     Social History Social History  Substance Use Topics  . Smoking status: Current Every Day Smoker    Packs/day: 1.00    Years: 15.00    Types: Cigarettes  . Smokeless tobacco: Never Used  . Alcohol use No     Comment: former     Allergies   Bee venom   Review of Systems Review of Systems  Constitutional: Negative for chills and fever.  HENT: Negative for ear pain and sore throat.   Eyes: Negative for pain and visual disturbance.  Respiratory: Negative for cough and shortness of breath.   Cardiovascular: Positive for chest pain. Negative for palpitations.  Gastrointestinal: Positive for vomiting. Negative for abdominal pain.  Genitourinary: Negative for dysuria and hematuria.  Musculoskeletal: Positive for back pain. Negative for arthralgias.  Skin: Negative for  color change and rash.  Neurological: Negative for seizures and syncope.  All other systems reviewed and are negative.    Physical Exam Updated Vital Signs BP (!) 138/55 (BP Location: Left Arm)   Pulse 60   Temp 99 F (37.2 C) (Oral)   Resp 18   Ht 5\' 6"  (1.676 m)   Wt 63.5 kg (140 lb)   SpO2 98%   BMI 22.60 kg/m   Physical Exam  Constitutional: He appears well-developed.  HENT:  Head: Normocephalic and atraumatic.  Eyes: Conjunctivae are normal.  Neck: Neck supple.  Cardiovascular: Normal rate and  regular rhythm.   No murmur heard. Pulmonary/Chest: Effort normal and breath sounds normal. No respiratory distress.  Abdominal: Soft. There is no tenderness.  Musculoskeletal: He exhibits tenderness (Midline thoracic back TTP). He exhibits no edema.  Neurological: He is alert. No cranial nerve deficit. Coordination normal.  5/5 motor strength and intact sensation in all extremities. Intact bilateral finger-to-nose coordination  Skin: Skin is warm and dry.  Nursing note and vitals reviewed.    ED Treatments / Results  Labs (all labs ordered are listed, but only abnormal results are displayed) Labs Reviewed  BASIC METABOLIC PANEL - Abnormal; Notable for the following:       Result Value   Potassium 3.2 (*)    Glucose, Bld 107 (*)    Calcium 8.8 (*)    All other components within normal limits  CBC - Abnormal; Notable for the following:    WBC 11.7 (*)    RBC 4.05 (*)    Hemoglobin 11.2 (*)    HCT 34.4 (*)    All other components within normal limits  URINALYSIS, ROUTINE W REFLEX MICROSCOPIC - Abnormal; Notable for the following:    Color, Urine STRAW (*)    Specific Gravity, Urine 1.004 (*)    All other components within normal limits  TSH - Abnormal; Notable for the following:    TSH 0.214 (*)    All other components within normal limits  MAGNESIUM - Abnormal; Notable for the following:    Magnesium 1.6 (*)    All other components within normal limits  APTT - Abnormal; Notable for the following:    aPTT 38 (*)    All other components within normal limits  CULTURE, BLOOD (ROUTINE X 2)  CULTURE, BLOOD (ROUTINE X 2)  URINE CULTURE  PHOSPHORUS  PROTIME-INR  HIV ANTIBODY (ROUTINE TESTING)  HEPATITIS PANEL, ACUTE  COMPREHENSIVE METABOLIC PANEL  CBC  I-STAT TROPONIN, ED    EKG  EKG Interpretation  Date/Time:  Friday July 07 2017 07:01:35 EDT Ventricular Rate:  55 PR Interval:    QRS Duration: 91 QT Interval:  434 QTC Calculation: 416 R Axis:   39 Text  Interpretation:  Sinus rhythm Ventricular premature complex Short PR interval T wave abnormality Abnormal ekg Confirmed by Carmin Muskrat [phone]) on 07/07/2017 7:19:25 AM       Radiology Dg Chest 2 View  Result Date: 07/07/2017 CLINICAL DATA:  Chest pain.  Back pain . EXAM: CHEST  2 VIEW COMPARISON:  CT 08/09/2014. FINDINGS: Mediastinum and hilar structures normal periods mild cardiomegaly. No pulmonary venous congestion. No focal infiltrate. Mild right base pleural thickening noted most likely secondary to scarring. Small right pleural effusion cannot be excluded. Surgical clips upper chest. IMPRESSION: 1. Right base mild pleural thickening noted most consistent scarring. Small right pleural effusion cannot be excluded. No focal infiltrate noted. 2.  Cardiomegaly.  No pulmonary venous congestion . Electronically Signed  ByMarcello Moores  Register   On: 07/07/2017 07:35   Mr Thoracic Spine Wo Contrast  Result Date: 07/07/2017 CLINICAL DATA:  Thoracic back pain. IV drug use. Concern for epidural abscess. EXAM: MRI THORACIC SPINE WITHOUT CONTRAST TECHNIQUE: Multiplanar, multisequence MR imaging of the thoracic spine was performed. No intravenous contrast was administered. COMPARISON:  Chest radiographs 07/07/2017 FINDINGS: Due to patient pain, the study is motion degraded despite medication and multiple repeated imaging attempts. Alignment: Exaggerated thoracic kyphosis centered at T5-6. No listhesis. Vertebrae: There is edema diffusely throughout the T5 and T6 vertebral bodies. There is anterior wedging of the T5 vertebral body with approximately 70% height loss. Slight anterior wedging is noted of the T3, T4, T6, and T7 vertebral bodies. There is abnormal fluid signal in the T5-6 disc space with edema in the surrounding paravertebral soft tissues. There may be trace ventral epidural phlegmon or hematoma at T5-6, however assessment is limited by motion artifact and no sizable epidural fluid collection is seen  to indicate drainable abscess. Cord: Normal signal and morphology within limitations of motion artifact. Paraspinal and other soft tissues: Lipoma within the posterior left paraspinal musculature from C5-T3 measuring approximately 7 cm in length an approximately 3 x 3 cm transversely. Trace bilateral pleural effusions. Disc levels: Disc degeneration at C5-6 and C6-7 with moderate disc space narrowing, disc bulging, and spurring, only imaged sagittally and incompletely evaluated though without high-grade spinal stenosis or [place] cord compression. No sizable disc herniation or spinal stenosis in the thoracic spine. Mild thoracic facet arthrosis without evidence of significant neural foraminal stenosis. IMPRESSION: 1. Moderate to severe T5 compression fracture deformity. Extensive edema throughout the T5 and T6 vertebral bodies and intervening disc signal is concerning for infectious discitis/osteomyelitis given the clinical history and surrounding paravertebral soft tissue edema. While there is slight wedging of the T6 vertebral body, purely posttraumatic edema without infection is felt less likely. No evidence of epidural abscess. 2. Large lipoma in the left posterior paraspinal cervicothoracic musculature. Electronically Signed   By: Logan Bores M.D.   On: 07/07/2017 13:12    Procedures Procedures (including critical care time)  Medications Ordered in ED Medications  FLUoxetine (PROZAC) capsule 20 mg (not administered)  gabapentin (NEURONTIN) capsule 100 mg (100 mg Oral Given 07/08/17 0007)  nicotine (NICODERM CQ - dosed in mg/24 hours) patch 21 mg (not administered)  heparin injection 5,000 Units (5,000 Units Subcutaneous Given 07/07/17 2220)  0.9 %  sodium chloride infusion ( Intravenous New Bag/Given 07/08/17 0038)  acetaminophen (TYLENOL) tablet 650 mg (not administered)    Or  acetaminophen (TYLENOL) suppository 650 mg (not administered)  HYDROcodone-acetaminophen (NORCO/VICODIN) 5-325 MG per  tablet 1-2 tablet (2 tablets Oral Given 07/07/17 2218)  ketorolac (TORADOL) 15 MG/ML injection 15 mg (not administered)  polyethylene glycol (MIRALAX / GLYCOLAX) packet 17 g (not administered)  ondansetron (ZOFRAN) tablet 4 mg (not administered)    Or  ondansetron (ZOFRAN) injection 4 mg (not administered)  hydrALAZINE (APRESOLINE) tablet 50 mg (50 mg Oral Given 07/07/17 2219)  hydrALAZINE (APRESOLINE) injection 5 mg (5 mg Intravenous Given 07/07/17 1457)  promethazine (PHENERGAN) injection 12.5 mg (not administered)  LORazepam (ATIVAN) tablet 1 mg (1 mg Oral Given 07/07/17 2218)    Or  LORazepam (ATIVAN) injection 1 mg ( Intravenous See Alternative 07/07/17 2218)  thiamine (VITAMIN B-1) tablet 100 mg (not administered)    Or  thiamine (B-1) injection 100 mg (not administered)  folic acid (FOLVITE) tablet 1 mg (not administered)  multivitamin with minerals tablet  1 tablet (not administered)  LORazepam (ATIVAN) tablet 0-4 mg (0 mg Oral Not Given 07/08/17 0008)    Followed by  LORazepam (ATIVAN) tablet 0-4 mg (not administered)  HYDROmorphone (DILAUDID) injection 1 mg (not administered)  magnesium hydroxide (MILK OF MAGNESIA) suspension 30 mL (not administered)  cefTRIAXone (ROCEPHIN) 2 g in dextrose 5 % 50 mL IVPB (0 g Intravenous Hold 07/07/17 1939)  vancomycin (VANCOCIN) IVPB 750 mg/150 ml premix (not administered)  zolpidem (AMBIEN) tablet 5 mg (5 mg Oral Given 07/08/17 0007)  cyclobenzaprine (FLEXERIL) tablet 5 mg (5 mg Oral Given 07/07/17 0853)  HYDROmorphone (DILAUDID) injection 1 mg (1 mg Intravenous Given 07/07/17 0941)  LORazepam (ATIVAN) injection 1 mg (1 mg Intravenous Given 07/07/17 1008)  HYDROmorphone (DILAUDID) injection 2 mg (2 mg Intravenous Given 07/07/17 1058)  potassium chloride SA (K-DUR,KLOR-CON) CR tablet 40 mEq (40 mEq Oral Given 07/07/17 1715)     Initial Impression / Assessment and Plan / ED Course  I have reviewed the triage vital signs and the nursing  notes.  Pertinent labs & imaging results that were available during my care of the patient were reviewed by me and considered in my medical decision making (see chart for details).     21 yoM h/o active daily IV heroin use who p/w 3 weeks of thoracic back pain radiating to chest. No focal neuro deficits. Lungs CTAB. AF, VSS.  Concern for possible epidural abscess vs discitis given daily IV heroin use. Symptoms also likely musculoskeletal in nature as reproducible with palpation and worsens with repositioning. Doubt aortic dissection given chronic nature of pain without sudden worsening or tearing. Benign abdominal exam. AAA on differential though lower concern at this time. MR spine ordered. Multiple pain medications required to obtain imaging  MR T spine showing T5 compression fracture and concern for discitis/osteomyelitis of T5-T6 disc. Vanc/rocephin ordered. NSY recommends admission to medicine for continued abx, no emergent procedures. Medicine and ID consulted. Pt admitted for further management and evaluation.  Pt care d/w Dr. Vanita Panda  Final Clinical Impressions(s) / ED Diagnoses   Final diagnoses:  Chronic obstructive pulmonary disease, unspecified COPD type [facility])  Back pain    New Prescriptions Current Discharge Medication List       Gregory Emerald, MD 07/08/17 0240    Carmin Muskrat, MD 07/08/17 2350

## 2017-07-07 NOTE — Consult Note (Signed)
[facility] for Infectious Disease    Date of Admission:  07/07/2017   Total days of antibiotics 0               Reason for Consult:  black   Referring Provider: T5 discitis and osteomyelitis   Assessment: T5 osteomyelitis and discitis Heroin abuse Previously treated hepatitis C   Plan: 1. Ask interventional radiology to perform aspirate (done) 2. Recheck HIV and hepatitis C testing 3. Hold antibiotics as long as he is clinically stable (he is afebrile and hypertensive currently), while awaiting aspirate 4. Check blood cultures 5. Consider psychiatric evaluation for placement in rehabilitation 6. Heroin detox  Thank you so much for this interesting consult,  Principal Problem:   Compression fracture of thoracic vertebra (HCC) Active Problems:   Substance induced mood disorder (HCC)   Hepatic steatosis   Tobacco abuse   History of hepatitis C   History of COPD   MDD (major depressive disorder), recurrent severe, without psychosis ([place])   IVDU (intravenous drug user)   Back pain   Lipoma   Hypokalemia   Bradycardia   . FLUoxetine  20 mg Oral Daily  . folic acid  1 mg Oral Daily  . gabapentin  100 mg Oral QHS  . heparin  5,000 Units Subcutaneous Q8H  . hydrALAZINE  50 mg Oral Q8H  . LORazepam  0-4 mg Oral Q6H   Followed by  . [START ON 07/09/2017] LORazepam  0-4 mg Oral Q12H  . magnesium hydroxide  30 mL Oral Daily  . multivitamin with minerals  1 tablet Oral Daily  . nicotine  21 mg Transdermal Daily  . potassium chloride  40 mEq Oral Once  . thiamine  100 mg Oral Daily   Or  . thiamine  100 mg Intravenous Daily    HPI: Gregory Price is a 66 y.o. male 66 year old male with a history of heroin abuse as recently as last night. Comes to the hospital with a 3 week history of mid back pain. He has not had fevers or chills, but he has had right course and sweats.  He is produced been treated for hepatitis C, within the last 12 months. He has  previously been in drug rehabilitation, within the last 12 months.   Review of Systems: Review of Systems  Constitutional: Positive for diaphoresis. Negative for chills and fever.  HENT: Negative for sore throat.   Eyes: Negative for blurred vision.  Gastrointestinal: Negative for constipation and diarrhea.  Genitourinary: Negative for dysuria.  Musculoskeletal: Positive for back pain.  Neurological: Negative for headaches.  Psychiatric/Behavioral: Positive for substance abuse.  no oral ulcers/sores Please see HPI. 12 point ROS o/w (-)   Past Medical History:  Diagnosis Date  . Compression fracture of thoracic vertebra (HCC)   . COPD (chronic obstructive pulmonary disease) ([place])   . Hepatitis C   . Lung cancer ([place])    resolved in 2011    Social History  Substance Use Topics  . Smoking status: Current Every Day Smoker    Packs/day: 1.00    Years: 15.00    Types: Cigarettes  . Smokeless tobacco: Never Used  . Alcohol use No     Comment: former    Family History  Problem Relation Age of Onset  . Cancer Other      Medications:  Scheduled: . FLUoxetine  20 mg Oral Daily  . folic acid  1 mg Oral Daily  .  gabapentin  100 mg Oral QHS  . heparin  5,000 Units Subcutaneous Q8H  . hydrALAZINE  50 mg Oral Q8H  . LORazepam  0-4 mg Oral Q6H   Followed by  . [START ON 07/09/2017] LORazepam  0-4 mg Oral Q12H  . magnesium hydroxide  30 mL Oral Daily  . multivitamin with minerals  1 tablet Oral Daily  . nicotine  21 mg Transdermal Daily  . potassium chloride  40 mEq Oral Once  . thiamine  100 mg Oral Daily   Or  . thiamine  100 mg Intravenous Daily    Abtx:  Anti-infectives    Start     Dose/Rate Route Frequency Ordered Stop   07/07/17 1345  vancomycin (VANCOCIN) 1,500 mg in sodium chloride 0.9 % 500 mL IVPB     1,500 mg 250 mL/hr over 120 Minutes Intravenous  Once 07/07/17 1339     07/07/17 1345  cefTRIAXone (ROCEPHIN) 2 g in dextrose 5 % 50 mL IVPB     2 g 100  mL/hr over 30 Minutes Intravenous  Once 07/07/17 1339          OBJECTIVE: Blood pressure (!) 181/74, pulse (!) 50, temperature 98.3 F (36.8 C), temperature source Oral, resp. rate 16, height '5\' 6"'$  (1.676 m), weight 63.5 kg (140 lb), SpO2 97 %.  Physical Exam  Constitutional: He is well-developed, well-nourished, and in no distress.  HENT:  Mouth/Throat: No oropharyngeal exudate.  Eyes: Pupils are equal, round, and reactive to light. EOM are normal.  Neck: Neck supple.  Cardiovascular: Normal rate, regular rhythm and normal heart sounds.   No murmur heard. Pulmonary/Chest: Effort normal and breath sounds normal.  Abdominal: Soft. Bowel sounds are normal. There is no tenderness. There is no rebound.  Musculoskeletal: He exhibits no edema.       Arms: Lymphadenopathy:    He has no cervical adenopathy.  Psychiatric: He is not agitated. He exhibits a depressed mood. He has a flat affect.    Lab Results Results for orders placed or performed during the hospital encounter of 07/07/17 (from the past 48 hour(s))  Basic metabolic panel     Status: Abnormal   Collection Time: 07/07/17  8:11 AM  Result Value Ref Range   Sodium 140 135 - 145 mmol/L   Potassium 3.2 (L) 3.5 - 5.1 mmol/L   Chloride 105 101 - 111 mmol/L   CO2 24 22 - 32 mmol/L   Glucose, Bld 107 (H) 65 - 99 mg/dL   BUN 9 6 - 20 mg/dL   Creatinine, Ser 0.75 0.61 - 1.24 mg/dL   Calcium 8.8 (L) 8.9 - 10.3 mg/dL   GFR calc non Af Amer >60 >60 mL/min   GFR calc Af Amer >60 >60 mL/min    Comment: (NOTE) The eGFR has been calculated using the CKD EPI equation. This calculation has not been validated in all clinical situations. eGFR's persistently <60 mL/min signify possible Chronic Kidney Disease.    Anion gap 11 5 - 15  CBC     Status: Abnormal   Collection Time: 07/07/17  8:11 AM  Result Value Ref Range   WBC 11.7 (H) 4.0 - 10.5 K/uL   RBC 4.05 (L) 4.22 - 5.81 MIL/uL   Hemoglobin 11.2 (L) 13.0 - 17.0 g/dL   HCT 34.4  (L) 39.0 - 52.0 %   MCV 84.9 78.0 - 100.0 fL   MCH 27.7 26.0 - 34.0 pg   MCHC 32.6 30.0 - 36.0 g/dL  RDW 13.7 11.5 - 15.5 %   Platelets 265 150 - 400 K/uL  I-stat troponin, ED     Status: None   Collection Time: 07/07/17  8:20 AM  Result Value Ref Range   Troponin i, poc 0.00 0.00 - 0.08 ng/mL   Comment 3            Comment: Due to the release kinetics of cTnI, a negative result within the first hours of the onset of symptoms does not rule out myocardial infarction with certainty. If myocardial infarction is still suspected, repeat the test at appropriate intervals.    No results found for: SDES, SPECREQUEST, CULT, REPTSTATUS Dg Chest 2 View  Result Date: 07/07/2017 CLINICAL DATA:  Chest pain.  Back pain . EXAM: CHEST  2 VIEW COMPARISON:  CT 08/09/2014. FINDINGS: Mediastinum and hilar structures normal periods mild cardiomegaly. No pulmonary venous congestion. No focal infiltrate. Mild right base pleural thickening noted most likely secondary to scarring. Small right pleural effusion cannot be excluded. Surgical clips upper chest. IMPRESSION: 1. Right base mild pleural thickening noted most consistent scarring. Small right pleural effusion cannot be excluded. No focal infiltrate noted. 2.  Cardiomegaly.  No pulmonary venous congestion . Electronically Signed   By: Marcello Moores  Register   On: 07/07/2017 07:35   Mr Thoracic Spine Wo Contrast  Result Date: 07/07/2017 CLINICAL DATA:  Thoracic back pain. IV drug use. Concern for epidural abscess. EXAM: MRI THORACIC SPINE WITHOUT CONTRAST TECHNIQUE: Multiplanar, multisequence MR imaging of the thoracic spine was performed. No intravenous contrast was administered. COMPARISON:  Chest radiographs 07/07/2017 FINDINGS: Due to patient pain, the study is motion degraded despite medication and multiple repeated imaging attempts. Alignment: Exaggerated thoracic kyphosis centered at T5-6. No listhesis. Vertebrae: There is edema diffusely throughout the T5 and  T6 vertebral bodies. There is anterior wedging of the T5 vertebral body with approximately 70% height loss. Slight anterior wedging is noted of the T3, T4, T6, and T7 vertebral bodies. There is abnormal fluid signal in the T5-6 disc space with edema in the surrounding paravertebral soft tissues. There may be trace ventral epidural phlegmon or hematoma at T5-6, however assessment is limited by motion artifact and no sizable epidural fluid collection is seen to indicate drainable abscess. Cord: Normal signal and morphology within limitations of motion artifact. Paraspinal and other soft tissues: Lipoma within the posterior left paraspinal musculature from C5-T3 measuring approximately 7 cm in length an approximately 3 x 3 cm transversely. Trace bilateral pleural effusions. Disc levels: Disc degeneration at C5-6 and C6-7 with moderate disc space narrowing, disc bulging, and spurring, only imaged sagittally and incompletely evaluated though without high-grade spinal stenosis or frank cord compression. No sizable disc herniation or spinal stenosis in the thoracic spine. Mild thoracic facet arthrosis without evidence of significant neural foraminal stenosis. IMPRESSION: 1. Moderate to severe T5 compression fracture deformity. Extensive edema throughout the T5 and T6 vertebral bodies and intervening disc signal is concerning for infectious discitis/osteomyelitis given the clinical history and surrounding paravertebral soft tissue edema. While there is slight wedging of the T6 vertebral body, purely posttraumatic edema without infection is felt less likely. No evidence of epidural abscess. 2. Large lipoma in the left posterior paraspinal cervicothoracic musculature. Electronically Signed   By: Logan Bores M.D.   On: 07/07/2017 13:12   No results found for this or any previous visit (from the past 240 hour(s)).  Microbiology: No results found for this or any previous visit (from the past 240 hour(s)).  Bobby Rumpf, MD [facility] [phone] 07/07/2017, 3:01 PM

## 2017-07-07 NOTE — ED Triage Notes (Signed)
BIB EMS from home, pt reports central back pain that radiates into his chest. Present for 3 weeks. Pt also reports he threw up this AM d/t pain being so severe. States that it is a sharp/stabbing pain that worsens with movement, he states that Advil helps ease the pain.  Pt received 1NTG en route that did not help. VSS.

## 2017-07-07 NOTE — ED Notes (Signed)
MRI calling to see if patient can get more pain medication. Pt unable to tolerate scan. New orders received from Dr. Vanita Panda.

## 2017-07-07 NOTE — Consult Note (Signed)
Chief Complaint   Chief Complaint  Patient presents with  . Chest Pain  . Back Pain    HPI   HPI: Gregory Price is a 67 y.o. male who presented to ER complaining of worsening thoracic back pain over the past three weeks. No known injury. Pain is severe. Radiates to chest. No associated numbness/tingling. Was managing with Tylenol and ibuprofen without relief. No radicular symptoms, weakness in legs, gait instability, bowel/bladder dysfunction. Admits to daily IV heroin use.  No history of spine surgery, spine trauma.   Patient Active Problem List   Diagnosis Date Noted  . Compression fracture of thoracic vertebra ([place]) 07/07/2017  . IVDU (intravenous drug user) 07/07/2017  . Back pain 07/07/2017  . Lipoma 07/07/2017  . Hypokalemia 07/07/2017  . Bradycardia 07/07/2017  . COPD (chronic obstructive pulmonary disease) ([place])   . Opioid type dependence, continuous ([place]) 10/19/2015  . MDD (major depressive disorder), recurrent severe, without psychosis ([place]) 10/17/2015  . Opiate withdrawal ([place]) 08/09/2014  . Hepatic steatosis 08/09/2014  . Constipation due to opioid therapy 08/09/2014  . Hx of appendectomy 08/09/2014  . Bilateral renal cysts 08/09/2014  . Leukocytosis 08/09/2014  . Tobacco abuse 08/09/2014  . History of hepatitis C 08/09/2014  . History of COPD 08/09/2014  . Opioid withdrawal ([place]) 08/09/2014  . Elevated blood pressure 08/09/2014  . Substance induced mood disorder ([place]) 08/07/2014  . Suicidal ideation 08/07/2014    PMH: Past Medical History:  Diagnosis Date  . Compression fracture of thoracic vertebra ([facility])   . COPD (chronic obstructive pulmonary disease) ([place])   . Hepatitis C   . Lung cancer ([place])    resolved in 2011    [place]: Past Surgical History:  Procedure Laterality Date  . ABDOMINAL SURGERY     2008  . APPENDECTOMY    . KNEE SURGERY     right knee, 1967  . LUNG SURGERY     2011     (Not in a hospital admission)  SH: Social History   Substance Use Topics  . Smoking status: Current Every Day Smoker    Packs/day: 1.00    Years: 15.00    Types: Cigarettes  . Smokeless tobacco: Never Used  . Alcohol use No     Comment: former    MEDS: Prior to Admission medications   Medication Sig Start Date End Date Taking? Authorizing Provider  FLUoxetine (PROZAC) 20 MG capsule Take 1 capsule (20 mg total) by mouth daily. 10/26/15   Withrow, Elyse Jarvis, FNP  gabapentin (NEURONTIN) 100 MG capsule Take 1 capsule (100 mg total) by mouth at bedtime. 10/26/15   Withrow, Elyse Jarvis, FNP  nicotine (NICODERM CQ - DOSED IN MG/24 HOURS) 21 mg/24hr patch Place 1 patch (21 mg total) onto the skin daily. 10/26/15   Withrow, Elyse Jarvis, FNP  temazepam (RESTORIL) 30 MG capsule Take 1 capsule (30 mg total) by mouth at bedtime as needed for sleep. 10/26/15   Withrow, Elyse Jarvis, FNP    ALLERGY: Allergies  Allergen Reactions  . Bee Venom Anaphylaxis    Social History  Substance Use Topics  . Smoking status: Current Every Day Smoker    Packs/day: 1.00    Years: 15.00    Types: Cigarettes  . Smokeless tobacco: Never Used  . Alcohol use No     Comment: former     Family History  Problem Relation Age of Onset  . Cancer Other      ROS   Review of Systems  Constitutional:  Positive for malaise/fatigue. Negative for chills and fever.  Eyes: Negative for blurred vision, double vision and photophobia.  Cardiovascular: Positive for chest pain.  Gastrointestinal: Negative for nausea and vomiting.  Genitourinary: Negative.   Musculoskeletal: Positive for back pain, joint pain and myalgias. Negative for falls and neck pain.  Neurological: Positive for weakness. Negative for dizziness, tingling, tremors, sensory change, speech change, focal weakness, seizures, loss of consciousness and headaches.    Exam   Vitals:   07/07/17 1400 07/07/17 1500  BP: (!) 181/74 (!) 178/76  Pulse: (!) 50 (!) 49  Resp: 16 (!) 22  Temp:    SpO2: 97% 97%   General  appearance: NAD, resting comfortably, nontoxic in appearance Eyes: PERRL, Fundoscopic: normal Cardiovascular: Regular rate and rhythm without murmurs, rubs, gallops. No edema or variciosities. Distal pulses normal. Pulmonary: Clear to auscultation Musculoskeletal:     Muscle tone upper extremities: Normal    Muscle tone lower extremities: Normal    Motor exam: Upper Extremities Deltoid Bicep Tricep Grip  Right 5/5 5/5 5/5 5/5  Left 5/5 5/5 5/5 5/5   Lower Extremity IP Quad PF DF EHL  Right 5/5 5/5 5/5 5/5 5/5  Left 5/5 5/5 5/5 5/5 5/5   Neurological Awake, alert, oriented Memory and concentration grossly intact Speech fluent, appropriate CNII: Visual fields normal CNIII/IV/VI: EOMI CNV: Facial sensation normal CNVII: Symmetric, normal strength CNVIII: Grossly normal CNIX: Normal palate movement CNXI: Trap and SCM strength normal CN XII: Tongue protrusion normal Sensation grossly intact to LT DTR: Normal Coordination (finger/nose & heel/shin): Normal  Results - Imaging/Labs   Results for orders placed or performed during the hospital encounter of 07/07/17 (from the past 48 hour(s))  Basic metabolic panel     Status: Abnormal   Collection Time: 07/07/17  8:11 AM  Result Value Ref Range   Sodium 140 135 - 145 mmol/L   Potassium 3.2 (L) 3.5 - 5.1 mmol/L   Chloride 105 101 - 111 mmol/L   CO2 24 22 - 32 mmol/L   Glucose, Bld 107 (H) 65 - 99 mg/dL   BUN 9 6 - 20 mg/dL   Creatinine, Ser 0.75 0.61 - 1.24 mg/dL   Calcium 8.8 (L) 8.9 - 10.3 mg/dL   GFR calc non Af Amer >60 >60 mL/min   GFR calc Af Amer >60 >60 mL/min    Comment: (NOTE) The eGFR has been calculated using the CKD EPI equation. This calculation has not been validated in all clinical situations. eGFR's persistently <60 mL/min signify possible Chronic Kidney Disease.    Anion gap 11 5 - 15  CBC     Status: Abnormal   Collection Time: 07/07/17  8:11 AM  Result Value Ref Range   WBC 11.7 (H) 4.0 - 10.5 K/uL    RBC 4.05 (L) 4.22 - 5.81 MIL/uL   Hemoglobin 11.2 (L) 13.0 - 17.0 g/dL   HCT 34.4 (L) 39.0 - 52.0 %   MCV 84.9 78.0 - 100.0 fL   MCH 27.7 26.0 - 34.0 pg   MCHC 32.6 30.0 - 36.0 g/dL   RDW 13.7 11.5 - 15.5 %   Platelets 265 150 - 400 K/uL  I-stat troponin, ED     Status: None   Collection Time: 07/07/17  8:20 AM  Result Value Ref Range   Troponin i, poc 0.00 0.00 - 0.08 ng/mL   Comment 3            Comment: Due to the release kinetics of cTnI, a  negative result within the first hours of the onset of symptoms does not rule out myocardial infarction with certainty. If myocardial infarction is still suspected, repeat the test at appropriate intervals.   TSH     Status: Abnormal   Collection Time: 07/07/17  2:20 PM  Result Value Ref Range   TSH 0.214 (L) 0.350 - 4.500 uIU/mL    Comment: Performed by a 3rd Generation assay with a functional sensitivity of <=0.01 uIU/mL.  Phosphorus     Status: None   Collection Time: 07/07/17  2:35 PM  Result Value Ref Range   Phosphorus 2.8 2.5 - 4.6 mg/dL  Magnesium     Status: Abnormal   Collection Time: 07/07/17  2:35 PM  Result Value Ref Range   Magnesium 1.6 (L) 1.7 - 2.4 mg/dL    Dg Chest 2 View  Result Date: 07/07/2017 CLINICAL DATA:  Chest pain.  Back pain . EXAM: CHEST  2 VIEW COMPARISON:  CT 08/09/2014. FINDINGS: Mediastinum and hilar structures normal periods mild cardiomegaly. No pulmonary venous congestion. No focal infiltrate. Mild right base pleural thickening noted most likely secondary to scarring. Small right pleural effusion cannot be excluded. Surgical clips upper chest. IMPRESSION: 1. Right base mild pleural thickening noted most consistent scarring. Small right pleural effusion cannot be excluded. No focal infiltrate noted. 2.  Cardiomegaly.  No pulmonary venous congestion . Electronically Signed   By: Marcello Moores  Register   On: 07/07/2017 07:35   Mr Thoracic Spine Wo Contrast  Result Date: 07/07/2017 CLINICAL DATA:  Thoracic  back pain. IV drug use. Concern for epidural abscess. EXAM: MRI THORACIC SPINE WITHOUT CONTRAST TECHNIQUE: Multiplanar, multisequence MR imaging of the thoracic spine was performed. No intravenous contrast was administered. COMPARISON:  Chest radiographs 07/07/2017 FINDINGS: Due to patient pain, the study is motion degraded despite medication and multiple repeated imaging attempts. Alignment: Exaggerated thoracic kyphosis centered at T5-6. No listhesis. Vertebrae: There is edema diffusely throughout the T5 and T6 vertebral bodies. There is anterior wedging of the T5 vertebral body with approximately 70% height loss. Slight anterior wedging is noted of the T3, T4, T6, and T7 vertebral bodies. There is abnormal fluid signal in the T5-6 disc space with edema in the surrounding paravertebral soft tissues. There may be trace ventral epidural phlegmon or hematoma at T5-6, however assessment is limited by motion artifact and no sizable epidural fluid collection is seen to indicate drainable abscess. Cord: Normal signal and morphology within limitations of motion artifact. Paraspinal and other soft tissues: Lipoma within the posterior left paraspinal musculature from C5-T3 measuring approximately 7 cm in length an approximately 3 x 3 cm transversely. Trace bilateral pleural effusions. Disc levels: Disc degeneration at C5-6 and C6-7 with moderate disc space narrowing, disc bulging, and spurring, only imaged sagittally and incompletely evaluated though without high-grade spinal stenosis or frank cord compression. No sizable disc herniation or spinal stenosis in the thoracic spine. Mild thoracic facet arthrosis without evidence of significant neural foraminal stenosis. IMPRESSION: 1. Moderate to severe T5 compression fracture deformity. Extensive edema throughout the T5 and T6 vertebral bodies and intervening disc signal is concerning for infectious discitis/osteomyelitis given the clinical history and surrounding  paravertebral soft tissue edema. While there is slight wedging of the T6 vertebral body, purely posttraumatic edema without infection is felt less likely. No evidence of epidural abscess. 2. Large lipoma in the left posterior paraspinal cervicothoracic musculature. Electronically Signed   By: Logan Bores M.D.   On: 07/07/2017 13:12    Impression/Plan  66 y.o. male, history of daily IV heroin drug use, with worsening thoracic back pain. MRI shows T5 compression fracture deformity with edema throughout T5 and T6 vertebral bodies and intervening disc consistent with discitis/osteomyelitis. Neurologically intact. No NS intervention indicated.  - Admitted under hospitalist service  - Consult IR for possible aspirtation   - consult ID for empiric antibiotics. Likely needs 6-8 weeks of abx  - Monitor neuro exam. Report any changes  - will need to f/u outpt in 4 weeks for thoracic Xrays

## 2017-07-07 NOTE — ED Notes (Signed)
Patient is stable and ready to be transport to the floor at this time.  Report was called to 5W RN.  Belongings taken with the patient to the floor.   

## 2017-07-07 NOTE — Progress Notes (Signed)
Patient ID: Gregory Price, male   DOB: 20-Jul-1951, 66 y.o.   MRN: [id number]   Request made for T 5 disc aspiration Discitis; back pain  Reviewed imaging with Dr Estanislado Pandy This level is too high for him to access safely. He declines this procedure  Rec: Treatment  Re imaging if worsens  Will inform Dr Johnnye Sima

## 2017-07-08 ENCOUNTER — Encounter (HOSPITAL_COMMUNITY): Payer: Self-pay

## 2017-07-08 DIAGNOSIS — M4644 Discitis, unspecified, thoracic region: Secondary | ICD-10-CM

## 2017-07-08 DIAGNOSIS — Z8709 Personal history of other diseases of the respiratory system: Secondary | ICD-10-CM

## 2017-07-08 DIAGNOSIS — F1994 Other psychoactive substance use, unspecified with psychoactive substance-induced mood disorder: Secondary | ICD-10-CM

## 2017-07-08 DIAGNOSIS — M4620 Osteomyelitis of vertebra, site unspecified: Secondary | ICD-10-CM

## 2017-07-08 DIAGNOSIS — Z720 Tobacco use: Secondary | ICD-10-CM

## 2017-07-08 DIAGNOSIS — M546 Pain in thoracic spine: Secondary | ICD-10-CM

## 2017-07-08 DIAGNOSIS — R001 Bradycardia, unspecified: Secondary | ICD-10-CM

## 2017-07-08 DIAGNOSIS — J449 Chronic obstructive pulmonary disease, unspecified: Secondary | ICD-10-CM

## 2017-07-08 DIAGNOSIS — F1990 Other psychoactive substance use, unspecified, uncomplicated: Secondary | ICD-10-CM

## 2017-07-08 DIAGNOSIS — S22000G Wedge compression fracture of unspecified thoracic vertebra, subsequent encounter for fracture with delayed healing: Secondary | ICD-10-CM

## 2017-07-08 DIAGNOSIS — Z8619 Personal history of other infectious and parasitic diseases: Secondary | ICD-10-CM

## 2017-07-08 DIAGNOSIS — E876 Hypokalemia: Secondary | ICD-10-CM

## 2017-07-08 DIAGNOSIS — F332 Major depressive disorder, recurrent severe without psychotic features: Secondary | ICD-10-CM

## 2017-07-08 DIAGNOSIS — M869 Osteomyelitis, unspecified: Secondary | ICD-10-CM

## 2017-07-08 DIAGNOSIS — K760 Fatty (change of) liver, not elsewhere classified: Secondary | ICD-10-CM

## 2017-07-08 LAB — COMPREHENSIVE METABOLIC PANEL
ALBUMIN: 3.2 g/dL — AB (ref 3.5–5.0)
ALT: 10 U/L — ABNORMAL LOW (ref 17–63)
ANION GAP: 10 (ref 5–15)
AST: 13 U/L — ABNORMAL LOW (ref 15–41)
Alkaline Phosphatase: 66 U/L (ref 38–126)
BILIRUBIN TOTAL: 0.9 mg/dL (ref 0.3–1.2)
BUN: 8 mg/dL (ref 6–20)
CO2: 24 mmol/L (ref 22–32)
Calcium: 8.8 mg/dL — ABNORMAL LOW (ref 8.9–10.3)
Chloride: 106 mmol/L (ref 101–111)
Creatinine, Ser: 0.7 mg/dL (ref 0.61–1.24)
GFR calc Af Amer: >60 mL/min (ref >60)
GLUCOSE: 95 mg/dL (ref 65–99)
Potassium: 3.3 mmol/L — ABNORMAL LOW (ref 3.5–5.1)
Sodium: 140 mmol/L (ref 135–145)
TOTAL PROTEIN: 6.5 g/dL (ref 6.5–8.1)

## 2017-07-08 LAB — HEPATITIS PANEL, ACUTE
HCV Ab: >11 s/co ratio — ABNORMAL HIGH (ref 0.0–0.9)
Hep A IgM: NEGATIVE
Hep B C IgM: NEGATIVE
Hepatitis B Surface Ag: NEGATIVE

## 2017-07-08 LAB — CBC
HCT: 37.1 % — ABNORMAL LOW (ref 39.0–52.0)
HEMOGLOBIN: 12.3 g/dL — AB (ref 13.0–17.0)
MCH: 27.6 pg (ref 26.0–34.0)
MCHC: 33.2 g/dL (ref 30.0–36.0)
MCV: 83.4 fL (ref 78.0–100.0)
PLATELETS: 290 10*3/uL (ref 150–400)
RBC: 4.45 MIL/uL (ref 4.22–5.81)
RDW: 13.3 % (ref 11.5–15.5)
WBC: 9.8 10*3/uL (ref 4.0–10.5)

## 2017-07-08 LAB — HIV ANTIBODY (ROUTINE TESTING W REFLEX): HIV SCREEN 4TH GENERATION: NONREACTIVE

## 2017-07-08 LAB — URINE CULTURE: CULTURE: NO GROWTH

## 2017-07-08 MED ORDER — MAGNESIUM SULFATE 2 GM/50ML IV SOLN
2.0000 g | Freq: Once | INTRAVENOUS | Status: AC
  Administered 2017-07-08: 2 g via INTRAVENOUS
  Filled 2017-07-08: qty 50

## 2017-07-08 MED ORDER — PNEUMOCOCCAL VAC POLYVALENT 25 MCG/0.5ML IJ INJ
0.5000 mL | INJECTION | INTRAMUSCULAR | Status: AC
  Administered 2017-07-09: 0.5 mL via INTRAMUSCULAR
  Filled 2017-07-08: qty 0.5

## 2017-07-08 MED ORDER — POTASSIUM CHLORIDE CRYS ER 20 MEQ PO TBCR
40.0000 meq | EXTENDED_RELEASE_TABLET | Freq: Once | ORAL | Status: AC
  Administered 2017-07-08: 40 meq via ORAL
  Filled 2017-07-08: qty 2

## 2017-07-08 MED ORDER — INFLUENZA VAC SPLIT HIGH-DOSE 0.5 ML IM SUSY
0.5000 mL | PREFILLED_SYRINGE | INTRAMUSCULAR | Status: AC
  Administered 2017-07-09: 0.5 mL via INTRAMUSCULAR
  Filled 2017-07-08: qty 0.5

## 2017-07-08 NOTE — Progress Notes (Signed)
PROGRESS NOTE    Drystan Reader  BPZ:[id number] DOB: 1951/07/11 DOA: 07/07/2017 PCP: System, Provider Not In  Brief Narrative:  Gregory Price is a 66 y.o. male with medical history significant for daily IV heroin use, Tobacco use, COPD, Hepatitis C who presents to the emergency Department chief complaint persistent worsening back pain. Initial evaluation includes an MRI revealing T5 compression fracture as well as extensive edema throughout the T5 and T6 vertebral bodies and intervening disc signal is concerning for infectious discitis/osteomyelitis. [facility] were asked to admit.  Hx was obtained from the patient. He described a three-week history of persistent worsening mid thoracic spine pain. He describes the pain as constant pressure and sharp like worsening with movement and breathing. He states he has been taking ibuprofen every 3 hours or so with little improvement. He denies any recent trauma. He does endorse persistent long-standing constipation. Is unsure when his last bowel movement was. He denies dysuria hematuria frequency or urgency. He is a daily IV heroin user and injects approximately 3 times a day. Was admitted for back pain and being treated for Discitis/Osteo and on IV Abx. ID is following.   Assessment & Plan:   Principal Problem:   Compression fracture of thoracic vertebra (HCC) Active Problems:   Substance induced mood disorder (HCC)   Hepatic steatosis   Tobacco abuse   History of hepatitis C   History of COPD   MDD (major depressive disorder), recurrent severe, without psychosis ([place])   IVDU (intravenous drug user)   Back pain   Lipoma   Hypokalemia   Bradycardia   Discitis thoracic region   Osteomyelitis ([place])  T5 Compression Fracture of Thoracic Spine in the setting of Discitis and Osteomyelitis -Currently afebrile nontoxic appearing.  -Mild leukocytosis improved from 11.7 -> 9.8.   -MRI T Spine showed Moderate to severe T5 compression fracture  deformity. Extensive edema throughout the T5 and T6 vertebral bodies and intervening disc signal is concerning for infectious discitis/osteomyelitis given the clinical history and surrounding paravertebral soft tissue edema. While there is slight wedging of the T6 vertebral body, purely  posttraumatic edema without infection is felt less likely. No evidence of epidural abscess. Large lipoma in the left posterior paraspinal cervicothoracic musculature. -Neurosurgery consulted who recommended Consulting IR for possible Aspiration and ID for Empric Abx. No Neurosurgical intervention was indicated and Neurosurgery signed off and will need f/u as an outpatient in 4 weeks for Thoracic X-Rays -Patient Was provided with Rocephin and Vancomycin in the emergency department.  -IR consulted and felt the level was too high to access safely so IR Dr. Andrey Cota the procedure -ID consulted and Antibiotics per Infectious Disease.  -C/w Ceftriaxone 2 grams q24h and Vancomycin 750 mg q12h -Will not likely be candidate for long term OP IV antibiotics given being an IV Drug User.  -Follow Blood Cultures and showed NGTD at 1 day -HIV was Non-Reactive; Hep C was >11.0 so Hep C quant sent -Pain Control with Hydrocodone-Acetaminophen 1-2 tab po q4hprn moderate pain, Hydromorphone 1 mg IV q4hprn severe pain, and Ketorolac 15 mg q6hprn severe pain  Bradycardia.  -Heart rate range 47-52. Chart review indicates this is his baseline.  -Monitor on Telemetry -Repeat EKG -Obtained a TSH and was 0.214  Hypokalemia/Hypomagnesemia -Likely related to decreased oral intake. Mild. -Replete with Potassium Chloride 40 mEQ po once -Mag Level was 1.6 yesterday so replete with IV Mag Sulfate -Continue to Monitor and Replete as Necessary -Repeat CMP and Mag Level in AM  Current  IV Drug User.  -Patient reports injecting Heroin 3-4 times a day. Most recent injection the evening before Admission. -CIWA protocol with scheduled ativan  as well as prn -Social work consultation -Anti-emetics with po/IV Zofran and Promethazine 12.5 mg [id number] -C/w Folic Acid, MVI, and Thiamine -IVF now D/C'd  Hypertension -Only fair control in the emergency department. Likely related to pain in the setting of early withdrawal.  -Will avoid clonidine given tendency for bradycardia -Scheduled Hydralazine 50 mg po q8h -C/w Hydralazine 5 mg q4hprn for SBP>/= 180  COPD  -Not on home oxygen.  -Continues to smoke; Smoking cessation counseling given -Chest x-ray right base mild pleural thickening noted most consistent scarring. Small right pleural effusion cannot be excluded. No focal infiltrate noted. Oxygen saturation level greater than 90% on room air -Nicoderm 21 mg TD patch q24h  Hepatitis C -Was previously treated -HCV >11.0; Will Check HCV quant -Appreciate ID input  Depression -C/w Fluoxetine 20 mg po qHS  DVT prophylaxis: Heparin 5,000 units sq q8h Code Status: FULL CODE Family Communication: No family present at bedside Disposition Plan: Remain Inpatient   Consultants:   Infectious Diseases  Neurosurgery  Interventional Radiology    Procedures: None   Antimicrobials:  Anti-infectives    Start     Dose/Rate Route Frequency Ordered Stop   07/08/17 0400  vancomycin (VANCOCIN) IVPB 750 mg/150 ml premix     750 mg 150 mL/hr over 60 Minutes Intravenous Every 12 hours 07/07/17 1905     07/07/17 1645  cefTRIAXone (ROCEPHIN) 2 g in dextrose 5 % 50 mL IVPB     2 g 100 mL/hr over 30 Minutes Intravenous Every 24 hours 07/07/17 1639     07/07/17 1345  vancomycin (VANCOCIN) 1,500 mg in sodium chloride 0.9 % 500 mL IVPB  Status:  Discontinued     1,500 mg 250 mL/hr over 120 Minutes Intravenous  Once 07/07/17 1339 07/07/17 1513   07/07/17 1345  cefTRIAXone (ROCEPHIN) 2 g in dextrose 5 % 50 mL IVPB  Status:  Discontinued     2 g 100 mL/hr over 30 Minutes Intravenous  Once 07/07/17 1339 07/07/17 1519     Subjective: Seen  and examined at bedside and stated back was hurting. Had some nausea. No CP or SOB. States abdomen hurts when you press on it because of hernias. No other concerns or complaints at this time.   Objective: Vitals:   07/07/17 1930 07/07/17 2203 07/08/17 0459 07/08/17 1407  BP: (!) 168/80 (!) 138/55 (!) 143/69 (!) 149/58  Pulse: (!) 53 60 (!) 50   Resp: (!) 23 18 18    Temp:  99 F (37.2 C) 99.3 F (37.4 C)   TempSrc:  Oral Oral   SpO2: 96% 98% 97%   Weight:      Height:        Intake/Output Summary (Last 24 hours) at 07/08/17 1928 Last data filed at 07/08/17 1857  Gross per 24 hour  Intake           1257.5 ml  Output                1 ml  Net           1256.5 ml   Filed Weights   07/07/17 0659  Weight: 63.5 kg (140 lb)    Examination: Physical Exam:  Constitutional: Disheveled appearing Caucasian male in NAD and appears calm and comfortable Eyes: Lids and conjunctivae normal, sclerae anicteric  ENMT: External Ears, Nose appear normal.  Grossly normal hearing. Neck: Appears normal, supple, no cervical masses, normal ROM, no appreciable thyromegaly, no JVD Respiratory: Diminished to auscultation bilaterally, no wheezing, rales, rhonchi or crackles. Normal respiratory effort and patient is not tachypenic. No accessory muscle use.  Cardiovascular: RRR, no murmurs / rubs / gallops. S1 and S2 auscultated. No extremity edema. Abdomen: Soft, mildly tender, non-distended. Has some hernias Bowel sounds positive.  GU: Deferred. Musculoskeletal: No clubbing / cyanosis of digits/nails. No joint deformity upper and lower extremities. Skin: No rashes, lesions, ulcers on a limited skin eval. No induration; Warm and dry.  Neurologic: CN 2-12 grossly intact with no focal deficits. Strength 5/5 in all 4. Romberg sign cerebellar reflexes not assessed.  Psychiatric: Normal judgment and insight. Alert and oriented x 3. Normal mood and appropriate affect.   Data Reviewed: I have personally reviewed  following labs and imaging studies  CBC:  Recent Labs Lab 07/07/17 0811 07/08/17 0521  WBC 11.7* 9.8  HGB 11.2* 12.3*  HCT 34.4* 37.1*  MCV 84.9 83.4  PLT 265 250   Basic Metabolic Panel:  Recent Labs Lab 07/07/17 0811 07/07/17 1435 07/08/17 0521  NA 140  --  140  K 3.2*  --  3.3*  CL 105  --  106  CO2 24  --  24  GLUCOSE 107*  --  95  BUN 9  --  8  CREATININE 0.75  --  0.70  CALCIUM 8.8*  --  8.8*  MG  --  1.6*  --   PHOS  --  2.8  --    GFR: Estimated Creatinine Clearance: 82.7 mL/min (by C-G formula based on SCr of 0.7 mg/dL). Liver Function Tests:  Recent Labs Lab 07/08/17 0521  AST 13*  ALT 10*  ALKPHOS 66  BILITOT 0.9  PROT 6.5  ALBUMIN 3.2*   No results for input(s): LIPASE, AMYLASE in the last 168 hours. No results for input(s): AMMONIA in the last 168 hours. Coagulation Profile:  Recent Labs Lab 07/07/17 1835  INR 1.10   Cardiac Enzymes: No results for input(s): CKTOTAL, CKMB, CKMBINDEX, TROPONINI in the last 168 hours. BNP (last 3 results) No results for input(s): PROBNP in the last 8760 hours. HbA1C: No results for input(s): HGBA1C in the last 72 hours. CBG: No results for input(s): GLUCAP in the last 168 hours. Lipid Profile: No results for input(s): CHOL, HDL, LDLCALC, TRIG, CHOLHDL, LDLDIRECT in the last 72 hours. Thyroid Function Tests:  Recent Labs  07/07/17 1420  TSH 0.214*   Anemia Panel: No results for input(s): VITAMINB12, FOLATE, FERRITIN, TIBC, IRON, RETICCTPCT in the last 72 hours. Sepsis Labs: No results for input(s): PROCALCITON, LATICACIDVEN in the last 168 hours.  Recent Results (from the past 240 hour(s))  Blood culture (routine x 2)     Status: None (Preliminary result)   Collection Time: 07/07/17  2:40 PM  Result Value Ref Range Status   Specimen Description BLOOD RIGHT ARM  Final   Special Requests   Final    BOTTLES DRAWN AEROBIC AND ANAEROBIC Blood Culture adequate volume   Culture NO GROWTH 1 DAY   Final   Report Status PENDING  Incomplete  Blood culture (routine x 2)     Status: None (Preliminary result)   Collection Time: 07/07/17  2:40 PM  Result Value Ref Range Status   Specimen Description BLOOD RIGHT ANTECUBITAL  Final   Special Requests IN PEDIATRIC BOTTLE Blood Culture adequate volume  Final   Culture NO GROWTH 1 DAY  Final   Report Status PENDING  Incomplete  Urine culture     Status: None   Collection Time: 07/07/17  5:15 PM  Result Value Ref Range Status   Specimen Description URINE, CLEAN CATCH  Final   Special Requests NONE  Final   Culture NO GROWTH  Final   Report Status 07/08/2017 FINAL  Final     Radiology Studies: Dg Chest 2 View  Result Date: 07/07/2017 CLINICAL DATA:  Chest pain.  Back pain . EXAM: CHEST  2 VIEW COMPARISON:  CT 08/09/2014. FINDINGS: Mediastinum and hilar structures normal periods mild cardiomegaly. No pulmonary venous congestion. No focal infiltrate. Mild right base pleural thickening noted most likely secondary to scarring. Small right pleural effusion cannot be excluded. Surgical clips upper chest. IMPRESSION: 1. Right base mild pleural thickening noted most consistent scarring. Small right pleural effusion cannot be excluded. No focal infiltrate noted. 2.  Cardiomegaly.  No pulmonary venous congestion . Electronically Signed   By: Marcello Moores  Register   On: 07/07/2017 07:35   Mr Thoracic Spine Wo Contrast  Result Date: 07/07/2017 CLINICAL DATA:  Thoracic back pain. IV drug use. Concern for epidural abscess. EXAM: MRI THORACIC SPINE WITHOUT CONTRAST TECHNIQUE: Multiplanar, multisequence MR imaging of the thoracic spine was performed. No intravenous contrast was administered. COMPARISON:  Chest radiographs 07/07/2017 FINDINGS: Due to patient pain, the study is motion degraded despite medication and multiple repeated imaging attempts. Alignment: Exaggerated thoracic kyphosis centered at T5-6. No listhesis. Vertebrae: There is edema diffusely throughout  the T5 and T6 vertebral bodies. There is anterior wedging of the T5 vertebral body with approximately 70% height loss. Slight anterior wedging is noted of the T3, T4, T6, and T7 vertebral bodies. There is abnormal fluid signal in the T5-6 disc space with edema in the surrounding paravertebral soft tissues. There may be trace ventral epidural phlegmon or hematoma at T5-6, however assessment is limited by motion artifact and no sizable epidural fluid collection is seen to indicate drainable abscess. Cord: Normal signal and morphology within limitations of motion artifact. Paraspinal and other soft tissues: Lipoma within the posterior left paraspinal musculature from C5-T3 measuring approximately 7 cm in length an approximately 3 x 3 cm transversely. Trace bilateral pleural effusions. Disc levels: Disc degeneration at C5-6 and C6-7 with moderate disc space narrowing, disc bulging, and spurring, only imaged sagittally and incompletely evaluated though without high-grade spinal stenosis or frank cord compression. No sizable disc herniation or spinal stenosis in the thoracic spine. Mild thoracic facet arthrosis without evidence of significant neural foraminal stenosis. IMPRESSION: 1. Moderate to severe T5 compression fracture deformity. Extensive edema throughout the T5 and T6 vertebral bodies and intervening disc signal is concerning for infectious discitis/osteomyelitis given the clinical history and surrounding paravertebral soft tissue edema. While there is slight wedging of the T6 vertebral body, purely posttraumatic edema without infection is felt less likely. No evidence of epidural abscess. 2. Large lipoma in the left posterior paraspinal cervicothoracic musculature. Electronically Signed   By: Logan Bores M.D.   On: 07/07/2017 13:12   Scheduled Meds: . FLUoxetine  20 mg Oral Daily  . folic acid  1 mg Oral Daily  . gabapentin  100 mg Oral QHS  . heparin  5,000 Units Subcutaneous Q8H  . hydrALAZINE  50 mg  Oral Q8H  . [START ON 07/09/2017] Influenza vac split quadrivalent PF  0.5 mL Intramuscular Tomorrow-1000  . LORazepam  0-4 mg Oral Q6H   Followed by  . [START ON 07/09/2017] LORazepam  0-4 mg Oral Q12H  . magnesium hydroxide  30 mL Oral Daily  . multivitamin with minerals  1 tablet Oral Daily  . nicotine  21 mg Transdermal Daily  . [START ON 07/09/2017] pneumococcal 23 valent vaccine  0.5 mL Intramuscular Tomorrow-1000  . thiamine  100 mg Oral Daily   Or  . thiamine  100 mg Intravenous Daily   Continuous Infusions: . cefTRIAXone (ROCEPHIN)  IV Stopped (07/08/17 1642)  . vancomycin Stopped (07/08/17 1609)    LOS: 1 day   Kerney Elbe, DO [facility] Pager [phone]  If 7PM-7AM, please contact night-coverage [email or web address] Password [facility] 07/08/2017, 7:28 PM

## 2017-07-08 NOTE — Clinical Social Work Note (Signed)
Consult received for sub abuse. CSW met with pt @ bedside and provided sub abuse resources. Pt reports that he is already connected with [facility] in [place], [place]. Lanell Persons is at bedside and also indicated pt has tried many sub abuse treatment options through the [place].  CSW contact information provided in the event [facility] resources do not work out.  Pt has no other identified DC needs.  Mariano Doshi B. Joline Maxcy Clinical Social Work Dept Weekend Social Worker [phone] 10:16 AM

## 2017-07-08 NOTE — Progress Notes (Signed)
Neurosurgery Progress Note  No issues overnight.  Back pain improving slowly No N/T or weakness of extremities  EXAM:  BP (!) 143/69 (BP Location: Left Arm)   Pulse (!) 50   Temp 99.3 F (37.4 C) (Oral)   Resp 18   Ht 5\' 6"  (1.676 m)   Wt 63.5 kg (140 lb)   SpO2 97%   BMI 22.60 kg/m  Awake, alert, oriented  Speech fluent, appropriate  CN grossly intact  5/5 BUE/BLE   PLAN Improved pain this am Stable neuro exam No new NS recs. Will sign off Continue management per TH and ID Will need repeat Xrays outpt in 4 weeks  Monitor neuro exam and report any changes.

## 2017-07-08 NOTE — Progress Notes (Signed)
INFECTIOUS DISEASE PROGRESS NOTE  ID: Gregory Price is a 66 y.o. male with  Principal Problem:   Compression fracture of thoracic vertebra (HCC) Active Problems:   Substance induced mood disorder (HCC)   Hepatic steatosis   Tobacco abuse   History of hepatitis C   History of COPD   MDD (major depressive disorder), recurrent severe, without psychosis ([place])   IVDU (intravenous drug user)   Back pain   Lipoma   Hypokalemia   Bradycardia  Subjective: C/o back pain Slept well  Abtx:  Anti-infectives    Start     Dose/Rate Route Frequency Ordered Stop   07/08/17 0400  vancomycin (VANCOCIN) IVPB 750 mg/150 ml premix     750 mg 150 mL/hr over 60 Minutes Intravenous Every 12 hours 07/07/17 1905     07/07/17 1645  cefTRIAXone (ROCEPHIN) 2 g in dextrose 5 % 50 mL IVPB     2 g 100 mL/hr over 30 Minutes Intravenous Every 24 hours 07/07/17 1639     07/07/17 1345  vancomycin (VANCOCIN) 1,500 mg in sodium chloride 0.9 % 500 mL IVPB  Status:  Discontinued     1,500 mg 250 mL/hr over 120 Minutes Intravenous  Once 07/07/17 1339 07/07/17 1513   07/07/17 1345  cefTRIAXone (ROCEPHIN) 2 g in dextrose 5 % 50 mL IVPB  Status:  Discontinued     2 g 100 mL/hr over 30 Minutes Intravenous  Once 07/07/17 1339 07/07/17 1519      Medications:  Scheduled: . FLUoxetine  20 mg Oral Daily  . folic acid  1 mg Oral Daily  . gabapentin  100 mg Oral QHS  . heparin  5,000 Units Subcutaneous Q8H  . hydrALAZINE  50 mg Oral Q8H  . [START ON 07/09/2017] Influenza vac split quadrivalent PF  0.5 mL Intramuscular Tomorrow-1000  . LORazepam  0-4 mg Oral Q6H   Followed by  . [START ON 07/09/2017] LORazepam  0-4 mg Oral Q12H  . magnesium hydroxide  30 mL Oral Daily  . multivitamin with minerals  1 tablet Oral Daily  . nicotine  21 mg Transdermal Daily  . [START ON 07/09/2017] pneumococcal 23 valent vaccine  0.5 mL Intramuscular Tomorrow-1000  . thiamine  100 mg Oral Daily   Or  . thiamine  100 mg Intravenous  Daily    Objective: Vital signs in last 24 hours: Temp:  [99 F (37.2 C)-99.3 F (37.4 C)] 99.3 F (37.4 C) (09/29 0459) Pulse Rate:  [48-97] 50 (09/29 0459) Resp:  [15-23] 18 (09/29 0459) BP: (138-181)/(55-87) 143/69 (09/29 0459) SpO2:  [96 %-98 %] 97 % (09/29 0459)   General appearance: alert, cooperative and no distress Resp: clear to auscultation bilaterally Cardio: regular rate and rhythm GI: normal findings: bowel sounds normal and soft, non-tender  Lab Results  Recent Labs  07/07/17 0811 07/08/17 0521  WBC 11.7* 9.8  HGB 11.2* 12.3*  HCT 34.4* 37.1*  NA 140 140  K 3.2* 3.3*  CL 105 106  CO2 24 24  BUN 9 8  CREATININE 0.75 0.70   Liver Panel  Recent Labs  07/08/17 0521  PROT 6.5  ALBUMIN 3.2*  AST 13*  ALT 10*  ALKPHOS 66  BILITOT 0.9   Sedimentation Rate No results for input(s): ESRSEDRATE in the last 72 hours. C-Reactive Protein No results for input(s): CRP in the last 72 hours.  Microbiology: No results found for this or any previous visit (from the past 240 hour(s)).  Studies/Results: Dg Chest 2  View  Result Date: 07/07/2017 CLINICAL DATA:  Chest pain.  Back pain . EXAM: CHEST  2 VIEW COMPARISON:  CT 08/09/2014. FINDINGS: Mediastinum and hilar structures normal periods mild cardiomegaly. No pulmonary venous congestion. No focal infiltrate. Mild right base pleural thickening noted most likely secondary to scarring. Small right pleural effusion cannot be excluded. Surgical clips upper chest. IMPRESSION: 1. Right base mild pleural thickening noted most consistent scarring. Small right pleural effusion cannot be excluded. No focal infiltrate noted. 2.  Cardiomegaly.  No pulmonary venous congestion . Electronically Signed   By: Marcello Moores  Register   On: 07/07/2017 07:35   Mr Thoracic Spine Wo Contrast  Result Date: 07/07/2017 CLINICAL DATA:  Thoracic back pain. IV drug use. Concern for epidural abscess. EXAM: MRI THORACIC SPINE WITHOUT CONTRAST  TECHNIQUE: Multiplanar, multisequence MR imaging of the thoracic spine was performed. No intravenous contrast was administered. COMPARISON:  Chest radiographs 07/07/2017 FINDINGS: Due to patient pain, the study is motion degraded despite medication and multiple repeated imaging attempts. Alignment: Exaggerated thoracic kyphosis centered at T5-6. No listhesis. Vertebrae: There is edema diffusely throughout the T5 and T6 vertebral bodies. There is anterior wedging of the T5 vertebral body with approximately 70% height loss. Slight anterior wedging is noted of the T3, T4, T6, and T7 vertebral bodies. There is abnormal fluid signal in the T5-6 disc space with edema in the surrounding paravertebral soft tissues. There may be trace ventral epidural phlegmon or hematoma at T5-6, however assessment is limited by motion artifact and no sizable epidural fluid collection is seen to indicate drainable abscess. Cord: Normal signal and morphology within limitations of motion artifact. Paraspinal and other soft tissues: Lipoma within the posterior left paraspinal musculature from C5-T3 measuring approximately 7 cm in length an approximately 3 x 3 cm transversely. Trace bilateral pleural effusions. Disc levels: Disc degeneration at C5-6 and C6-7 with moderate disc space narrowing, disc bulging, and spurring, only imaged sagittally and incompletely evaluated though without high-grade spinal stenosis or frank cord compression. No sizable disc herniation or spinal stenosis in the thoracic spine. Mild thoracic facet arthrosis without evidence of significant neural foraminal stenosis. IMPRESSION: 1. Moderate to severe T5 compression fracture deformity. Extensive edema throughout the T5 and T6 vertebral bodies and intervening disc signal is concerning for infectious discitis/osteomyelitis given the clinical history and surrounding paravertebral soft tissue edema. While there is slight wedging of the T6 vertebral body, purely  posttraumatic edema without infection is felt less likely. No evidence of epidural abscess. 2. Large lipoma in the left posterior paraspinal cervicothoracic musculature. Electronically Signed   By: Logan Bores M.D.   On: 07/07/2017 13:12     Assessment/Plan: T5 discitis and osteomyelitis Heroin abuse Previous Hepatitis C  Total days of antibiotics: 1 vanco/ceftriaxone  Check Hep C RNA to see if he is re-infected BCx are ngtd No change in anbx for now.  Detox, rehab I would not place a PIC in this pt.          Bobby Rumpf MD, FACP Infectious Diseases (pager) [phone] [email or web address] 07/08/2017, 12:21 PM  LOS: 1 day

## 2017-07-09 ENCOUNTER — Encounter (HOSPITAL_COMMUNITY): Payer: Self-pay | Admitting: General Practice

## 2017-07-09 DIAGNOSIS — R7881 Bacteremia: Secondary | ICD-10-CM

## 2017-07-09 DIAGNOSIS — F1410 Cocaine abuse, uncomplicated: Secondary | ICD-10-CM

## 2017-07-09 LAB — BLOOD CULTURE ID PANEL (REFLEXED)
Acinetobacter baumannii: NOT DETECTED
CANDIDA KRUSEI: NOT DETECTED
CARBAPENEM RESISTANCE: NOT DETECTED
Candida albicans: NOT DETECTED
Candida glabrata: NOT DETECTED
Candida parapsilosis: NOT DETECTED
Candida tropicalis: NOT DETECTED
ENTEROBACTER CLOACAE COMPLEX: NOT DETECTED
ENTEROBACTERIACEAE SPECIES: DETECTED — AB
ENTEROCOCCUS SPECIES: NOT DETECTED
Escherichia coli: NOT DETECTED
HAEMOPHILUS INFLUENZAE: NOT DETECTED
KLEBSIELLA OXYTOCA: NOT DETECTED
Klebsiella pneumoniae: NOT DETECTED
LISTERIA MONOCYTOGENES: NOT DETECTED
Neisseria meningitidis: NOT DETECTED
PSEUDOMONAS AERUGINOSA: NOT DETECTED
Proteus species: NOT DETECTED
STAPHYLOCOCCUS AUREUS BCID: NOT DETECTED
STREPTOCOCCUS PNEUMONIAE: NOT DETECTED
STREPTOCOCCUS PYOGENES: NOT DETECTED
STREPTOCOCCUS SPECIES: NOT DETECTED
Serratia marcescens: DETECTED — AB
Staphylococcus species: NOT DETECTED
Streptococcus agalactiae: NOT DETECTED

## 2017-07-09 LAB — CBC WITH DIFFERENTIAL/PLATELET
BASOS ABS: 0 10*3/uL (ref 0.0–0.1)
BASOS PCT: 1 %
EOS ABS: 0 10*3/uL (ref 0.0–0.7)
Eosinophils Relative: 1 %
HEMATOCRIT: 40 % (ref 39.0–52.0)
HEMOGLOBIN: 13.2 g/dL (ref 13.0–17.0)
Lymphocytes Relative: 16 %
Lymphs Abs: 1.3 10*3/uL (ref 0.7–4.0)
MCH: 27.3 pg (ref 26.0–34.0)
MCHC: 33 g/dL (ref 30.0–36.0)
MCV: 82.8 fL (ref 78.0–100.0)
MONOS PCT: 6 %
Monocytes Absolute: 0.5 10*3/uL (ref 0.1–1.0)
NEUTROS ABS: 6.6 10*3/uL (ref 1.7–7.7)
NEUTROS PCT: 76 %
Platelets: 327 10*3/uL (ref 150–400)
RBC: 4.83 MIL/uL (ref 4.22–5.81)
RDW: 13.3 % (ref 11.5–15.5)
WBC: 8.5 10*3/uL (ref 4.0–10.5)

## 2017-07-09 LAB — COMPREHENSIVE METABOLIC PANEL
ALBUMIN: 3.5 g/dL (ref 3.5–5.0)
ALK PHOS: 69 U/L (ref 38–126)
ALT: 10 U/L — AB (ref 17–63)
ANION GAP: 12 (ref 5–15)
AST: 13 U/L — AB (ref 15–41)
BILIRUBIN TOTAL: 1.1 mg/dL (ref 0.3–1.2)
BUN: 10 mg/dL (ref 6–20)
CO2: 23 mmol/L (ref 22–32)
Calcium: 8.8 mg/dL — ABNORMAL LOW (ref 8.9–10.3)
Chloride: 104 mmol/L (ref 101–111)
Creatinine, Ser: 0.72 mg/dL (ref 0.61–1.24)
GFR calc Af Amer: >60 mL/min (ref >60)
GFR calc non Af Amer: >60 mL/min (ref >60)
GLUCOSE: 107 mg/dL — AB (ref 65–99)
Potassium: 3.5 mmol/L (ref 3.5–5.1)
SODIUM: 139 mmol/L (ref 135–145)
TOTAL PROTEIN: 7.1 g/dL (ref 6.5–8.1)

## 2017-07-09 LAB — HCV RNA QUANT: HCV Quantitative: NOT DETECTED IU/mL (ref >50)

## 2017-07-09 LAB — PHOSPHORUS: Phosphorus: 3 mg/dL (ref 2.5–4.6)

## 2017-07-09 LAB — MAGNESIUM: Magnesium: 2.1 mg/dL (ref 1.7–2.4)

## 2017-07-09 MED ORDER — POTASSIUM CHLORIDE CRYS ER 20 MEQ PO TBCR
40.0000 meq | EXTENDED_RELEASE_TABLET | Freq: Once | ORAL | Status: AC
  Administered 2017-07-09: 40 meq via ORAL
  Filled 2017-07-09: qty 2

## 2017-07-09 NOTE — Progress Notes (Addendum)
PROGRESS NOTE    Gregory Price  CNO:[id number] DOB: 20-Mar-1951 DOA: 07/07/2017 PCP: System, Provider Not In  Brief Narrative:  Gregory Price is a 66 y.o. male with medical history significant for daily IV heroin use, Tobacco use, COPD, Hepatitis C who presents to the emergency Department chief complaint persistent worsening back pain. Initial evaluation includes an MRI revealing T5 compression fracture as well as extensive edema throughout the T5 and T6 vertebral bodies and intervening disc signal is concerning for infectious discitis/osteomyelitis. [facility] were asked to admit.  Hx was obtained from the patient. He described a three-week history of persistent worsening mid thoracic spine pain. He describes the pain as constant pressure and sharp like worsening with movement and breathing. He states he has been taking ibuprofen every 3 hours or so with little improvement. He denies any recent trauma. He does endorse persistent long-standing constipation. Is unsure when his last bowel movement was. He denies dysuria hematuria frequency or urgency. He is a daily IV heroin user and injects approximately 3 times a day. Was admitted for back pain and being treated for Discitis/Osteo and on IV Abx. ID is following. Patient felt as if his back was improving today and he stated that he slept ok last night.  Assessment & Plan:   Principal Problem:   Compression fracture of thoracic vertebra (HCC) Active Problems:   Substance induced mood disorder (HCC)   Hepatic steatosis   Tobacco abuse   History of hepatitis C   History of COPD   MDD (major depressive disorder), recurrent severe, without psychosis (Gregory Price)   IVDU (intravenous drug user)   Back pain   Lipoma   Hypokalemia   Bradycardia   Discitis thoracic region   Osteomyelitis (Gregory Price)  T5 Compression Fracture of Thoracic Spine in the setting of Discitis and Osteomyelitis and GNR Bacteremia -Currently afebrile nontoxic appearing.  -Mild  leukocytosis improved from 11.7 -> 9.8 -> 8.5 -MRI T Spine showed Moderate to severe T5 compression fracture deformity. Extensive edema throughout the T5 and T6 vertebral bodies and intervening disc signal is concerning for infectious discitis/osteomyelitis given the clinical history and surrounding paravertebral soft tissue edema. While there is slight wedging of the T6 vertebral body, purely  posttraumatic edema without infection is felt less likely. No evidence of epidural abscess. Large lipoma in the left posterior paraspinal cervicothoracic musculature. -Neurosurgery consulted who recommended Consulting IR for possible Aspiration and ID for Empric Abx. No Neurosurgical intervention was indicated and Neurosurgery signed off and will need f/u as an outpatient in 4 weeks for Thoracic X-Rays -Patient Was provided with Rocephin and Vancomycin in the emergency department.  -IR consulted and felt the level was too high to access safely so IR Dr. Andrey Cota the procedure -ID consulted and Antibiotics per Infectious Disease.  -C/w Ceftriaxone 2 grams q24h; Vancomycin 750 mg q12h stopped by ID given blood Cx -Will not likely be candidate for long term OP IV antibiotics given being an IV Drug User.  -Follow Blood Cultures and showed 1/2 Cx with GNR Enterobacteriaceae Species -Sensitivities from Blood Cx pending to help determine long term po Therapy  -HIV was Non-Reactive; Hep C was >11.0 so Hep C quant sent and pending  -Pain Control with Hydrocodone-Acetaminophen 1-2 tab po q4hprn moderate pain, Hydromorphone 1 mg IV q4hprn severe pain, and Ketorolac 15 mg q6hprn severe pain -Per ID would not place IV PICC LINE  Bradycardia.  -Heart rate ranged 47-52 but now improved to 67-76. Chart review indicates this is his baseline.  -  Monitor on Telemetry -Obtained a TSH and was 0.214  Hypokalemia/Hypomagnesemia -Likely related to decreased oral intake. Mild. -Replete with Potassium Chloride 40 mEQ po once  today -Mag Level was 1.6 yesterday so replete with IV Mag Sulfate and now is 2.1 -Continue to Monitor and Replete as Necessary -Repeat CMP and Mag Level in AM  Current IV Drug User.  -Patient reports injecting Heroin 3-4 times a day. Most recent injection the evening before Admission. -CIWA protocol with scheduled ativan as well as prn -Social work consultation for [place] with po/IV Zofran and Promethazine 12.5 mg Y1OFBP -C/w Folic Acid, MVI, and Thiamine -IVF now D/C'd  Hypertension -Only fair control in the emergency department. Likely related to pain in the setting of early withdrawal.  -BP was 143/76 this AM -Will avoid clonidine given tendency for bradycardia -Scheduled Hydralazine 50 mg po q8h -C/w Hydralazine 5 mg q4hprn for SBP>/= 180  COPD  -Not on home oxygen.  -Continues to smoke; Smoking cessation counseling given -Chest x-ray right base mild pleural thickening noted most consistent scarring. Small right pleural effusion cannot be excluded. No focal infiltrate noted. Oxygen saturation level greater than 90% on room air -Nicoderm 21 mg TD patch q24h  Hepatitis C -Was previously treated -HCV >11.0; Will Check HCV quant and pending  -Appreciate ID input  Depression -C/w Fluoxetine 20 mg po qHS  DVT prophylaxis: Heparin 5,000 units sq q8h Code Status: FULL CODE Family Communication: Girlfriend at bedside  Disposition Plan: Remain Inpatient   Consultants:   Infectious Diseases  Neurosurgery  Interventional Radiology    Procedures: None   Antimicrobials:  Anti-infectives    Start     Dose/Rate Route Frequency Ordered Stop   07/08/17 0400  vancomycin (VANCOCIN) IVPB 750 mg/150 ml premix  Status:  Discontinued     750 mg 150 mL/hr over 60 Minutes Intravenous Every 12 hours 07/07/17 1905 07/09/17 1325   07/07/17 1645  cefTRIAXone (ROCEPHIN) 2 g in dextrose 5 % 50 mL IVPB     2 g 100 mL/hr over 30 Minutes Intravenous  Every 24 hours 07/07/17 1639     07/07/17 1345  vancomycin (VANCOCIN) 1,500 mg in sodium chloride 0.9 % 500 mL IVPB  Status:  Discontinued     1,500 mg 250 mL/hr over 120 Minutes Intravenous  Once 07/07/17 1339 07/07/17 1513   07/07/17 1345  cefTRIAXone (ROCEPHIN) 2 g in dextrose 5 % 50 mL IVPB  Status:  Discontinued     2 g 100 mL/hr over 30 Minutes Intravenous  Once 07/07/17 1339 07/07/17 1519     Subjective: Seen and examined at bedside and stated back pain was somewhat better and that he rested decently last night. No lightheadedness or dizziness.   Objective: Vitals:   07/08/17 2203 07/09/17 0014 07/09/17 0454 07/09/17 0601  BP: (!) 152/71 (!) 150/73  (!) 143/76  Pulse: 71 76 72 67  Resp: 18 18  18   Temp: 98.6 F (37 C) 99.8 F (37.7 C)  99.4 F (37.4 C)  TempSrc: Oral Oral  Oral  SpO2: 97% 97%  98%  Weight:      Height:        Intake/Output Summary (Last 24 hours) at 07/09/17 1455 Last data filed at 07/08/17 1857  Gross per 24 hour  Intake              200 ml  Output  0 ml  Net              200 ml   Filed Weights   07/07/17 0659  Weight: 63.5 kg (140 lb)    Examination: Physical Exam:  Constitutional: Caucasian male in NAD appears calm and comfortable Eyes:  Sclerae anicteric. Lids normal. ENMT: External ears and nose appear normal. Grossly normal hearing Neck: Supple with no JVD Respiratory: CTAB; unlabored breathing. No wheezing/rales/rhonchi Cardiovascular: RRR; No m/r/g. No extremity edema Abdomen: Soft, NT, ND. Has some hernias. Bowel sounds present GU: Deferred Musculoskeletal: No cyanosis. No contractures Skin: No rashes, lesions on a limited skin eval  Neurologic: CN 2-12 grossly intact. Romberg sign not assessed Psychiatric: Normal mood and affect. Intact judgement and insight. Awake and oriented x3  Data Reviewed: I have personally reviewed following labs and imaging studies  CBC:  Recent Labs Lab 07/07/17 0811 07/08/17 0521  07/09/17 0350  WBC 11.7* 9.8 8.5  NEUTROABS  --   --  6.6  HGB 11.2* 12.3* 13.2  HCT 34.4* 37.1* 40.0  MCV 84.9 83.4 82.8  PLT 265 290 536   Basic Metabolic Panel:  Recent Labs Lab 07/07/17 0811 07/07/17 1435 07/08/17 0521 07/09/17 0350  NA 140  --  140 139  K 3.2*  --  3.3* 3.5  CL 105  --  106 104  CO2 24  --  24 23  GLUCOSE 107*  --  95 107*  BUN 9  --  8 10  CREATININE 0.75  --  0.70 0.72  CALCIUM 8.8*  --  8.8* 8.8*  MG  --  1.6*  --  2.1  PHOS  --  2.8  --  3.0   GFR: Estimated Creatinine Clearance: 82.7 mL/min (by C-G formula based on SCr of 0.72 mg/dL). Liver Function Tests:  Recent Labs Lab 07/08/17 0521 07/09/17 0350  AST 13* 13*  ALT 10* 10*  ALKPHOS 66 69  BILITOT 0.9 1.1  PROT 6.5 7.1  ALBUMIN 3.2* 3.5   No results for input(s): LIPASE, AMYLASE in the last 168 hours. No results for input(s): AMMONIA in the last 168 hours. Coagulation Profile:  Recent Labs Lab 07/07/17 1835  INR 1.10   Cardiac Enzymes: No results for input(s): CKTOTAL, CKMB, CKMBINDEX, TROPONINI in the last 168 hours. BNP (last 3 results) No results for input(s): PROBNP in the last 8760 hours. HbA1C: No results for input(s): HGBA1C in the last 72 hours. CBG: No results for input(s): GLUCAP in the last 168 hours. Lipid Profile: No results for input(s): CHOL, HDL, LDLCALC, TRIG, CHOLHDL, LDLDIRECT in the last 72 hours. Thyroid Function Tests:  Recent Labs  07/07/17 1420  TSH 0.214*   Anemia Panel: No results for input(s): VITAMINB12, FOLATE, FERRITIN, TIBC, IRON, RETICCTPCT in the last 72 hours. Sepsis Labs: No results for input(s): PROCALCITON, LATICACIDVEN in the last 168 hours.  Recent Results (from the past 240 hour(s))  Blood culture (routine x 2)     Status: None (Preliminary result)   Collection Time: 07/07/17  2:40 PM  Result Value Ref Range Status   Specimen Description BLOOD RIGHT ARM  Final   Special Requests   Final    BOTTLES DRAWN AEROBIC AND  ANAEROBIC Blood Culture adequate volume   Culture  Setup Time   Final    GRAM NEGATIVE RODS ANAEROBIC BOTTLE ONLY CRITICAL RESULT CALLED TO, READ BACK BY AND VERIFIED WITH: M MACCIA,PHARMD AT 1239 07/09/17 BY L BENFIELD    Culture GRAM NEGATIVE RODS  Final   Report Status PENDING  Incomplete  Blood culture (routine x 2)     Status: None (Preliminary result)   Collection Time: 07/07/17  2:40 PM  Result Value Ref Range Status   Specimen Description BLOOD RIGHT ANTECUBITAL  Final   Special Requests IN PEDIATRIC BOTTLE Blood Culture adequate volume  Final   Culture NO GROWTH 2 DAYS  Final   Report Status PENDING  Incomplete  Blood Culture ID Panel (Reflexed)     Status: Abnormal   Collection Time: 07/07/17  2:40 PM  Result Value Ref Range Status   Enterococcus species NOT DETECTED NOT DETECTED Final   Listeria monocytogenes NOT DETECTED NOT DETECTED Final   Staphylococcus species NOT DETECTED NOT DETECTED Final   Staphylococcus aureus NOT DETECTED NOT DETECTED Final   Streptococcus species NOT DETECTED NOT DETECTED Final   Streptococcus agalactiae NOT DETECTED NOT DETECTED Final   Streptococcus pneumoniae NOT DETECTED NOT DETECTED Final   Streptococcus pyogenes NOT DETECTED NOT DETECTED Final   Acinetobacter baumannii NOT DETECTED NOT DETECTED Final   Enterobacteriaceae species DETECTED (A) NOT DETECTED Final    Comment: Enterobacteriaceae represent a large family of gram-negative bacteria, not a single organism. CRITICAL RESULT CALLED TO, READ BACK BY AND VERIFIED WITH: M MACCIA,PHARMD AT 1239 07/09/17 BY L BENFIELD    Enterobacter cloacae complex NOT DETECTED NOT DETECTED Final   Escherichia coli NOT DETECTED NOT DETECTED Final   Klebsiella oxytoca NOT DETECTED NOT DETECTED Final   Klebsiella pneumoniae NOT DETECTED NOT DETECTED Final   Proteus species NOT DETECTED NOT DETECTED Final   Serratia marcescens DETECTED (A) NOT DETECTED Final    Comment: CRITICAL RESULT CALLED TO, READ  BACK BY AND VERIFIED WITH: M MACCIA,PHARMD AT 1239 07/09/17 BY L BENFIELD    Carbapenem resistance NOT DETECTED NOT DETECTED Final   Haemophilus influenzae NOT DETECTED NOT DETECTED Final   Neisseria meningitidis NOT DETECTED NOT DETECTED Final   Pseudomonas aeruginosa NOT DETECTED NOT DETECTED Final   Candida albicans NOT DETECTED NOT DETECTED Final   Candida glabrata NOT DETECTED NOT DETECTED Final   Candida krusei NOT DETECTED NOT DETECTED Final   Candida parapsilosis NOT DETECTED NOT DETECTED Final   Candida tropicalis NOT DETECTED NOT DETECTED Final  Urine culture     Status: None   Collection Time: 07/07/17  5:15 PM  Result Value Ref Range Status   Specimen Description URINE, CLEAN CATCH  Final   Special Requests NONE  Final   Culture NO GROWTH  Final   Report Status 07/08/2017 FINAL  Final    Radiology Studies: No results found. Scheduled Meds: . FLUoxetine  20 mg Oral Daily  . folic acid  1 mg Oral Daily  . gabapentin  100 mg Oral QHS  . heparin  5,000 Units Subcutaneous Q8H  . hydrALAZINE  50 mg Oral Q8H  . LORazepam  0-4 mg Oral Q12H  . multivitamin with minerals  1 tablet Oral Daily  . nicotine  21 mg Transdermal Daily  . thiamine  100 mg Oral Daily   Or  . thiamine  100 mg Intravenous Daily   Continuous Infusions: . cefTRIAXone (ROCEPHIN)  IV Stopped (07/08/17 1642)    LOS: 2 days   Kerney Elbe, DO [facility] Pager [phone]  If 7PM-7AM, please contact night-coverage [email or web address] Password [email or web address] 07/09/2017, 2:55 PM

## 2017-07-09 NOTE — Progress Notes (Signed)
PHARMACY - PHYSICIAN COMMUNICATION CRITICAL VALUE ALERT - BLOOD CULTURE IDENTIFICATION (BCID)  Results for orders placed or performed during the hospital encounter of 07/07/17  Blood Culture ID Panel (Reflexed) (Collected: 07/07/2017  2:40 PM)  Result Value Ref Range   Enterococcus species NOT DETECTED NOT DETECTED   Listeria monocytogenes NOT DETECTED NOT DETECTED   Staphylococcus species NOT DETECTED NOT DETECTED   Staphylococcus aureus NOT DETECTED NOT DETECTED   Streptococcus species NOT DETECTED NOT DETECTED   Streptococcus agalactiae NOT DETECTED NOT DETECTED   Streptococcus pneumoniae NOT DETECTED NOT DETECTED   Streptococcus pyogenes NOT DETECTED NOT DETECTED   Acinetobacter baumannii NOT DETECTED NOT DETECTED   Enterobacteriaceae species DETECTED (A) NOT DETECTED   Enterobacter cloacae complex NOT DETECTED NOT DETECTED   Escherichia coli NOT DETECTED NOT DETECTED   Klebsiella oxytoca NOT DETECTED NOT DETECTED   Klebsiella pneumoniae NOT DETECTED NOT DETECTED   Proteus species NOT DETECTED NOT DETECTED   Serratia marcescens DETECTED (A) NOT DETECTED   Carbapenem resistance NOT DETECTED NOT DETECTED   Haemophilus influenzae NOT DETECTED NOT DETECTED   Neisseria meningitidis NOT DETECTED NOT DETECTED   Pseudomonas aeruginosa NOT DETECTED NOT DETECTED   Candida albicans NOT DETECTED NOT DETECTED   Candida glabrata NOT DETECTED NOT DETECTED   Candida krusei NOT DETECTED NOT DETECTED   Candida parapsilosis NOT DETECTED NOT DETECTED   Candida tropicalis NOT DETECTED NOT DETECTED    Name of physician (or Provider) Contacted: Dr Johnnye Sima  Changes to prescribed antibiotics required: Continue current abx  Wynell Balloon 07/09/2017  12:42 PM

## 2017-07-09 NOTE — Progress Notes (Signed)
INFECTIOUS DISEASE PROGRESS NOTE  ID: Gregory Price is a 66 y.o. male with  Principal Problem:   Compression fracture of thoracic vertebra (HCC) Active Problems:   Substance induced mood disorder (HCC)   Hepatic steatosis   Tobacco abuse   History of hepatitis C   History of COPD   MDD (major depressive disorder), recurrent severe, without psychosis ([place])   IVDU (intravenous drug user)   Back pain   Lipoma   Hypokalemia   Bradycardia   Discitis thoracic region   Osteomyelitis ([place])  Subjective: Less back pain.   Abtx:  Anti-infectives    Start     Dose/Rate Route Frequency Ordered Stop   07/08/17 0400  vancomycin (VANCOCIN) IVPB 750 mg/150 ml premix     750 mg 150 mL/hr over 60 Minutes Intravenous Every 12 hours 07/07/17 1905     07/07/17 1645  cefTRIAXone (ROCEPHIN) 2 g in dextrose 5 % 50 mL IVPB     2 g 100 mL/hr over 30 Minutes Intravenous Every 24 hours 07/07/17 1639     07/07/17 1345  vancomycin (VANCOCIN) 1,500 mg in sodium chloride 0.9 % 500 mL IVPB  Status:  Discontinued     1,500 mg 250 mL/hr over 120 Minutes Intravenous  Once 07/07/17 1339 07/07/17 1513   07/07/17 1345  cefTRIAXone (ROCEPHIN) 2 g in dextrose 5 % 50 mL IVPB  Status:  Discontinued     2 g 100 mL/hr over 30 Minutes Intravenous  Once 07/07/17 1339 07/07/17 1519      Medications:  Scheduled: . FLUoxetine  20 mg Oral Daily  . folic acid  1 mg Oral Daily  . gabapentin  100 mg Oral QHS  . heparin  5,000 Units Subcutaneous Q8H  . hydrALAZINE  50 mg Oral Q8H  . LORazepam  0-4 mg Oral Q12H  . multivitamin with minerals  1 tablet Oral Daily  . nicotine  21 mg Transdermal Daily  . thiamine  100 mg Oral Daily   Or  . thiamine  100 mg Intravenous Daily    Objective: Vital signs in last 24 hours: Temp:  [98.6 F (37 C)-99.8 F (37.7 C)] 99.4 F (37.4 C) (09/30 0601) Pulse Rate:  [67-76] 67 (09/30 0601) Resp:  [18] 18 (09/30 0601) BP: (143-152)/(58-76) 143/76 (09/30 0601) SpO2:  [97 %-98  %] 98 % (09/30 0601)   General appearance: alert, cooperative and no distress Resp: clear to auscultation bilaterally Cardio: regular rate and rhythm GI: normal findings: bowel sounds normal and soft, non-tender  Lab Results  Recent Labs  07/08/17 0521 07/09/17 0350  WBC 9.8 8.5  HGB 12.3* 13.2  HCT 37.1* 40.0  NA 140 139  K 3.3* 3.5  CL 106 104  CO2 24 23  BUN 8 10  CREATININE 0.70 0.72   Liver Panel  Recent Labs  07/08/17 0521 07/09/17 0350  PROT 6.5 7.1  ALBUMIN 3.2* 3.5  AST 13* 13*  ALT 10* 10*  ALKPHOS 66 69  BILITOT 0.9 1.1   Sedimentation Rate No results for input(s): ESRSEDRATE in the last 72 hours. C-Reactive Protein No results for input(s): CRP in the last 72 hours.  Microbiology: Recent Results (from the past 240 hour(s))  Blood culture (routine x 2)     Status: None (Preliminary result)   Collection Time: 07/07/17  2:40 PM  Result Value Ref Range Status   Specimen Description BLOOD RIGHT ARM  Final   Special Requests   Final    BOTTLES DRAWN  AEROBIC AND ANAEROBIC Blood Culture adequate volume   Culture  Setup Time   Final    GRAM NEGATIVE RODS ANAEROBIC BOTTLE ONLY CRITICAL RESULT CALLED TO, READ BACK BY AND VERIFIED WITH: M MACCIA,PHARMD AT 1239 07/09/17 BY L BENFIELD    Culture GRAM NEGATIVE RODS  Final   Report Status PENDING  Incomplete  Blood culture (routine x 2)     Status: None (Preliminary result)   Collection Time: 07/07/17  2:40 PM  Result Value Ref Range Status   Specimen Description BLOOD RIGHT ANTECUBITAL  Final   Special Requests IN PEDIATRIC BOTTLE Blood Culture adequate volume  Final   Culture NO GROWTH 1 DAY  Final   Report Status PENDING  Incomplete  Blood Culture ID Panel (Reflexed)     Status: Abnormal   Collection Time: 07/07/17  2:40 PM  Result Value Ref Range Status   Enterococcus species NOT DETECTED NOT DETECTED Final   Listeria monocytogenes NOT DETECTED NOT DETECTED Final   Staphylococcus species NOT DETECTED  NOT DETECTED Final   Staphylococcus aureus NOT DETECTED NOT DETECTED Final   Streptococcus species NOT DETECTED NOT DETECTED Final   Streptococcus agalactiae NOT DETECTED NOT DETECTED Final   Streptococcus pneumoniae NOT DETECTED NOT DETECTED Final   Streptococcus pyogenes NOT DETECTED NOT DETECTED Final   Acinetobacter baumannii NOT DETECTED NOT DETECTED Final   Enterobacteriaceae species DETECTED (A) NOT DETECTED Final    Comment: Enterobacteriaceae represent a large family of gram-negative bacteria, not a single organism. CRITICAL RESULT CALLED TO, READ BACK BY AND VERIFIED WITH: M MACCIA,PHARMD AT 1239 07/09/17 BY L BENFIELD    Enterobacter cloacae complex NOT DETECTED NOT DETECTED Final   Escherichia coli NOT DETECTED NOT DETECTED Final   Klebsiella oxytoca NOT DETECTED NOT DETECTED Final   Klebsiella pneumoniae NOT DETECTED NOT DETECTED Final   Proteus species NOT DETECTED NOT DETECTED Final   Serratia marcescens DETECTED (A) NOT DETECTED Final    Comment: CRITICAL RESULT CALLED TO, READ BACK BY AND VERIFIED WITH: M MACCIA,PHARMD AT 1239 07/09/17 BY L BENFIELD    Carbapenem resistance NOT DETECTED NOT DETECTED Final   Haemophilus influenzae NOT DETECTED NOT DETECTED Final   Neisseria meningitidis NOT DETECTED NOT DETECTED Final   Pseudomonas aeruginosa NOT DETECTED NOT DETECTED Final   Candida albicans NOT DETECTED NOT DETECTED Final   Candida glabrata NOT DETECTED NOT DETECTED Final   Candida krusei NOT DETECTED NOT DETECTED Final   Candida parapsilosis NOT DETECTED NOT DETECTED Final   Candida tropicalis NOT DETECTED NOT DETECTED Final  Urine culture     Status: None   Collection Time: 07/07/17  5:15 PM  Result Value Ref Range Status   Specimen Description URINE, CLEAN CATCH  Final   Special Requests NONE  Final   Culture NO GROWTH  Final   Report Status 07/08/2017 FINAL  Final    Studies/Results: No results found.   Assessment/Plan: T5 discitis and  osteomyelitis Heroin/Cocaine abuse Previous Hepatitis C  Total days of antibiotics: 2 vanco/ceftriaxone  Will stop vanco Will continue ceftriaxone while in hospital  Await sensi from BCx to help determine long term po therapy.   Detox, rehab Await Hep C RNA  Would not place PIC.         Gregory Rumpf MD, FACP Infectious Diseases (pager) [phone] [email or web address] 07/09/2017, 1:23 PM  LOS: 2 days

## 2017-07-10 DIAGNOSIS — R7881 Bacteremia: Secondary | ICD-10-CM

## 2017-07-10 DIAGNOSIS — Z87898 Personal history of other specified conditions: Secondary | ICD-10-CM

## 2017-07-10 DIAGNOSIS — M4624 Osteomyelitis of vertebra, thoracic region: Secondary | ICD-10-CM

## 2017-07-10 DIAGNOSIS — B9689 Other specified bacterial agents as the cause of diseases classified elsewhere: Secondary | ICD-10-CM

## 2017-07-10 DIAGNOSIS — M4620 Osteomyelitis of vertebra, site unspecified: Secondary | ICD-10-CM

## 2017-07-10 DIAGNOSIS — J156 Pneumonia due to other aerobic Gram-negative bacteria: Secondary | ICD-10-CM

## 2017-07-10 DIAGNOSIS — F1911 Other psychoactive substance abuse, in remission: Secondary | ICD-10-CM

## 2017-07-10 DIAGNOSIS — Z590 Homelessness: Secondary | ICD-10-CM

## 2017-07-10 DIAGNOSIS — A488 Other specified bacterial diseases: Secondary | ICD-10-CM

## 2017-07-10 LAB — CBC WITH DIFFERENTIAL/PLATELET
BASOS PCT: 0 %
Basophils Absolute: 0 10*3/uL (ref 0.0–0.1)
EOS ABS: 0.1 10*3/uL (ref 0.0–0.7)
Eosinophils Relative: 1 %
HCT: 39.8 % (ref 39.0–52.0)
HEMOGLOBIN: 13.3 g/dL (ref 13.0–17.0)
LYMPHS ABS: 2 10*3/uL (ref 0.7–4.0)
Lymphocytes Relative: 21 %
MCH: 27.7 pg (ref 26.0–34.0)
MCHC: 33.4 g/dL (ref 30.0–36.0)
MCV: 82.7 fL (ref 78.0–100.0)
MONOS PCT: 6 %
Monocytes Absolute: 0.5 10*3/uL (ref 0.1–1.0)
NEUTROS ABS: 6.5 10*3/uL (ref 1.7–7.7)
NEUTROS PCT: 72 %
Platelets: 344 10*3/uL (ref 150–400)
RBC: 4.81 MIL/uL (ref 4.22–5.81)
RDW: 13.3 % (ref 11.5–15.5)
WBC: 9.2 10*3/uL (ref 4.0–10.5)

## 2017-07-10 LAB — COMPREHENSIVE METABOLIC PANEL
ALBUMIN: 3.4 g/dL — AB (ref 3.5–5.0)
ALK PHOS: 71 U/L (ref 38–126)
ALT: 10 U/L — AB (ref 17–63)
AST: 11 U/L — ABNORMAL LOW (ref 15–41)
Anion gap: 12 (ref 5–15)
BILIRUBIN TOTAL: 1.2 mg/dL (ref 0.3–1.2)
BUN: 15 mg/dL (ref 6–20)
CALCIUM: 9.1 mg/dL (ref 8.9–10.3)
CO2: 20 mmol/L — AB (ref 22–32)
CREATININE: 0.81 mg/dL (ref 0.61–1.24)
Chloride: 106 mmol/L (ref 101–111)
GFR calc Af Amer: >60 mL/min (ref >60)
GFR calc non Af Amer: >60 mL/min (ref >60)
GLUCOSE: 88 mg/dL (ref 65–99)
Potassium: 3.8 mmol/L (ref 3.5–5.1)
SODIUM: 138 mmol/L (ref 135–145)
TOTAL PROTEIN: 6.5 g/dL (ref 6.5–8.1)

## 2017-07-10 LAB — MAGNESIUM: Magnesium: 2.1 mg/dL (ref 1.7–2.4)

## 2017-07-10 LAB — HEPATITIS C VRS RNA DETECT BY PCR-QUAL: Hepatitis C Vrs RNA by PCR-Qual: NEGATIVE

## 2017-07-10 LAB — PHOSPHORUS: Phosphorus: 4 mg/dL (ref 2.5–4.6)

## 2017-07-10 MED ORDER — SENNOSIDES-DOCUSATE SODIUM 8.6-50 MG PO TABS
1.0000 | ORAL_TABLET | Freq: Two times a day (BID) | ORAL | Status: DC
  Administered 2017-07-10 – 2017-07-11 (×3): 1 via ORAL
  Filled 2017-07-10 (×3): qty 1

## 2017-07-10 NOTE — Progress Notes (Signed)
PROGRESS NOTE    Gregory Price  DXI:[id number] DOB: 05-14-1951 DOA: 07/07/2017 PCP: System, Provider Not In  Brief Narrative:  Gregory Price is a 66 y.o. male with medical history significant for daily IV heroin use, Tobacco use, COPD, Hepatitis C who presents to the emergency Department chief complaint persistent worsening back pain. Initial evaluation includes an MRI revealing T5 compression fracture as well as extensive edema throughout the T5 and T6 vertebral bodies and intervening disc signal is concerning for infectious discitis/osteomyelitis. [facility] were asked to admit.  Hx was obtained from the patient. He described a three-week history of persistent worsening mid thoracic spine pain. He describes the pain as constant pressure and sharp like worsening with movement and breathing. He states he has been taking ibuprofen every 3 hours or so with little improvement. He denies any recent trauma. He did endorse persistent long-standing constipation. Is unsure when his last bowel movement was. He denies dysuria hematuria frequency or urgency. He is a daily IV heroin user and injects approximately 3 times a day. Was admitted for back pain and being treated for Discitis/Osteo and on IV Abx. ID is following. Patient found to have a bacteremia from Serratia Marcescens and is being continued on IV Ceftriaxone until sensitivities return. ID placed consult for potential Suboxone Clinic.   Assessment & Plan:   Principal Problem:   Compression fracture of thoracic vertebra (HCC) Active Problems:   Substance induced mood disorder (HCC)   Hepatic steatosis   Tobacco abuse   History of hepatitis C   History of COPD   MDD (major depressive disorder), recurrent severe, without psychosis ([place])   IVDU (intravenous drug user)   Back pain   Lipoma   Hypokalemia   Bradycardia   Discitis thoracic region   Vertebral osteomyelitis (HCC)   Serratia marcescens infection (HCC)   Gram-negative  bacteremia   History of intravenous drug abuse  T5 Compression Fracture of Thoracic Spine in the setting of Discitis and Osteomyelitis and GNR Bacteremia -Currently afebrile nontoxic appearing.  -Mild leukocytosis improved from 11.7 -> 9.8 -> 8.5 -> 9.2 -MRI T Spine showed Moderate to severe T5 compression fracture deformity. Extensive edema throughout the T5 and T6 vertebral bodies and intervening disc signal is concerning for infectious discitis/osteomyelitis given the clinical history and surrounding paravertebral soft tissue edema. While there is slight wedging of the T6 vertebral body, purely  posttraumatic edema without infection is felt less likely. No evidence of epidural abscess. Large lipoma in the left posterior paraspinal cervicothoracic musculature. -Neurosurgery consulted who recommended Consulting IR for possible Aspiration and ID for Empric Abx. No Neurosurgical intervention was indicated and Neurosurgery signed off and will need f/u as an outpatient in 4 weeks for Thoracic X-Rays -Patient Was provided with Rocephin and Vancomycin in the emergency department.  -IR consulted and felt the level was too high to access safely so IR Dr. Andrey Cota the procedure -ID consulted and Antibiotics per Infectious Disease.  -C/w Ceftriaxone 2 grams q24h; Vancomycin 750 mg q12h stopped by ID given blood Cx of Serratia  -Will not likely be candidate for long term OP IV antibiotics given being an IV Drug User.  -Follow Blood Cultures and Blood Cx 07/07/17 showed 1/2 Cx with GNR Enterobacteriaceae Species and was Serratia Marcescens with Sensitivities pending -Sensitivities from Blood Cx pending to help determine long term po Therapy  -Infectious Diseases repeating Blood Cultures on 07/10/2017 and are pending  -HIV was Non-Reactive; Hep C was >11.0 so Hep C quant sent and  Negative  -Pain Control with Hydrocodone-Acetaminophen 1-2 tab po q4hprn moderate pain, Hydromorphone 1 mg IV q4hprn severe pain, and  Ketorolac 15 mg q6hprn severe pain -Per ID would not place IV PICC LINE; Dr. Betsy Coder Consulted by ID for potential Suboxone Clinic   Bradycardia.  -Heart rate ranged 47-52 but now improved to 63-73. Chart review indicates this is his baseline.  -Monitor on Telemetry -Obtained a TSH and was 0.214  Hypokalemia/Hypomagnesemia, improved -Likely related to decreased oral intake. Mild. -K+ was 3.8 and Mag was 2.1 -Continue to Monitor and Replete as Necessary -Repeat CMP and Mag Level in AM  Current IV Drug User.  -Patient reports injecting Heroin 3-4 times a day. Most recent injection the evening before Admission. -CIWA protocol with scheduled ativan as well as prn -Social work consultation for [place] with po/IV Zofran and Promethazine 12.5 mg [id number] -C/w Folic Acid, MVI, and Thiamine -IVF now D/C'd -ID discussed with [facility] IM Faculty Dr. Betsy Coder for Suboxone Clinic Evaluation   Hypertension -Only fair control in the emergency department. Likely related to pain in the setting of early withdrawal.  -BP was 130/74 this AM -Will avoid Clonidine given tendency for bradycardia -Scheduled Hydralazine 50 mg po q8h -C/w Hydralazine 5 mg q4hprn for SBP>/= 180  COPD  -Not on home oxygen.  -Continues to smoke; Smoking cessation counseling given -Chest x-ray right base mild pleural thickening noted most consistent scarring. Small right pleural effusion cannot be excluded. No focal infiltrate noted. Oxygen saturation level greater than 90% on room air -Nicoderm 21 mg TD patch q24h  Hepatitis C -Was previously treated -HCV Ab was >11.0;  -Checked HCV Quantitative and was Negative -Appreciate ID input  Depression -C/w Fluoxetine 20 mg po qHS  Constipation -Improved -C/w Miralax 17 po Daily prn -Added Senna-Docusate 1 tab po BID  DVT prophylaxis: Heparin 5,000 units sq q8h Code Status: FULL CODE Family Communication: Girlfriend at  bedside  Disposition Plan: Remain Inpatient   Consultants:   Infectious Diseases  Neurosurgery  Interventional Radiology    Procedures: None   Antimicrobials:  Anti-infectives    Start     Dose/Rate Route Frequency Ordered Stop   07/08/17 0400  vancomycin (VANCOCIN) IVPB 750 mg/150 ml premix  Status:  Discontinued     750 mg 150 mL/hr over 60 Minutes Intravenous Every 12 hours 07/07/17 1905 07/09/17 1325   07/07/17 1645  cefTRIAXone (ROCEPHIN) 2 g in dextrose 5 % 50 mL IVPB     2 g 100 mL/hr over 30 Minutes Intravenous Every 24 hours 07/07/17 1639     07/07/17 1345  vancomycin (VANCOCIN) 1,500 mg in sodium chloride 0.9 % 500 mL IVPB  Status:  Discontinued     1,500 mg 250 mL/hr over 120 Minutes Intravenous  Once 07/07/17 1339 07/07/17 1513   07/07/17 1345  cefTRIAXone (ROCEPHIN) 2 g in dextrose 5 % 50 mL IVPB  Status:  Discontinued     2 g 100 mL/hr over 30 Minutes Intravenous  Once 07/07/17 1339 07/07/17 1519     Subjective: Seen and examined at bedside and stated back pain was hurting a little bit but no other complaints. No CP or SOB. No Nausea or Vomiting.  Objective: Vitals:   07/09/17 1453 07/09/17 1639 07/10/17 0030 07/10/17 0547  BP: (!) 143/76 (!) 150/84 (!) 148/71 130/74  Pulse:  73 70 66  Resp:  16 18 18   Temp:  99.1 F (37.3 C) 98.7 F (37.1 C) 99.1 F (  37.3 C)  TempSrc:  Oral Oral Oral  SpO2:  99% 99% 96%  Weight:      Height:        Intake/Output Summary (Last 24 hours) at 07/10/17 1229 Last data filed at 07/10/17 0603  Gross per 24 hour  Intake              170 ml  Output                0 ml  Net              170 ml   Filed Weights   07/07/17 0659  Weight: 63.5 kg (140 lb)   Examination: Physical Exam:  Constitutional: Homeless Caucasian male in NAD appears calm and comfortable  Eyes: Sclerae anicteric; Lids normal ENMT: External Ears and nose appear normal. Grossly normal hearing Neck: Supple with no JVD Respiratory: CTAB; no  appreciable wheezing/rales/rhonchi. Patient was not tachypenic or using any accessory muscles to breathe Cardiovascular: RRR; no m/r/g. No extremity edema Abdomen: Soft, NT, ND. Has Abdominal wall hernias and diathesis. Bowel sounds present GU: Deferred Musculoskeletal: No contractures; No cyanosis Skin: No rashes or lesions on a limited skin eval Neurologic: CN 2-12 grossly intact. No appreciable focal deficits Psychiatric: Normal mood and affect. Intact judgement and insight. Awake and alert  Data Reviewed: I have personally reviewed following labs and imaging studies  CBC:  Recent Labs Lab 07/07/17 0811 07/08/17 0521 07/09/17 0350 07/10/17 0404  WBC 11.7* 9.8 8.5 9.2  NEUTROABS  --   --  6.6 6.5  HGB 11.2* 12.3* 13.2 13.3  HCT 34.4* 37.1* 40.0 39.8  MCV 84.9 83.4 82.8 82.7  PLT 265 290 327 191   Basic Metabolic Panel:  Recent Labs Lab 07/07/17 0811 07/07/17 1435 07/08/17 0521 07/09/17 0350 07/10/17 0404  NA 140  --  140 139 138  K 3.2*  --  3.3* 3.5 3.8  CL 105  --  106 104 106  CO2 24  --  24 23 20*  GLUCOSE 107*  --  95 107* 88  BUN 9  --  8 10 15   CREATININE 0.75  --  0.70 0.72 0.81  CALCIUM 8.8*  --  8.8* 8.8* 9.1  MG  --  1.6*  --  2.1 2.1  PHOS  --  2.8  --  3.0 4.0   GFR: Estimated Creatinine Clearance: 81.7 mL/min (by C-G formula based on SCr of 0.81 mg/dL). Liver Function Tests:  Recent Labs Lab 07/08/17 0521 07/09/17 0350 07/10/17 0404  AST 13* 13* 11*  ALT 10* 10* 10*  ALKPHOS 66 69 71  BILITOT 0.9 1.1 1.2  PROT 6.5 7.1 6.5  ALBUMIN 3.2* 3.5 3.4*   No results for input(s): LIPASE, AMYLASE in the last 168 hours. No results for input(s): AMMONIA in the last 168 hours. Coagulation Profile:  Recent Labs Lab 07/07/17 1835  INR 1.10   Cardiac Enzymes: No results for input(s): CKTOTAL, CKMB, CKMBINDEX, TROPONINI in the last 168 hours. BNP (last 3 results) No results for input(s): PROBNP in the last 8760 hours. HbA1C: No results for  input(s): HGBA1C in the last 72 hours. CBG: No results for input(s): GLUCAP in the last 168 hours. Lipid Profile: No results for input(s): CHOL, HDL, LDLCALC, TRIG, CHOLHDL, LDLDIRECT in the last 72 hours. Thyroid Function Tests:  Recent Labs  07/07/17 1420  TSH 0.214*   Anemia Panel: No results for input(s): VITAMINB12, FOLATE, FERRITIN, TIBC, IRON, RETICCTPCT in the last  72 hours. Sepsis Labs: No results for input(s): PROCALCITON, LATICACIDVEN in the last 168 hours.  Recent Results (from the past 240 hour(s))  Blood culture (routine x 2)     Status: None (Preliminary result)   Collection Time: 07/07/17  2:40 PM  Result Value Ref Range Status   Specimen Description BLOOD RIGHT ARM  Final   Special Requests   Final    BOTTLES DRAWN AEROBIC AND ANAEROBIC Blood Culture adequate volume   Culture  Setup Time   Final    GRAM NEGATIVE RODS ANAEROBIC BOTTLE ONLY CRITICAL RESULT CALLED TO, READ BACK BY AND VERIFIED WITH: M MACCIA,PHARMD AT 1239 07/09/17 BY L BENFIELD    Culture   Final    GRAM NEGATIVE RODS IDENTIFICATION AND SUSCEPTIBILITIES TO FOLLOW    Report Status PENDING  Incomplete  Blood culture (routine x 2)     Status: None (Preliminary result)   Collection Time: 07/07/17  2:40 PM  Result Value Ref Range Status   Specimen Description BLOOD RIGHT ANTECUBITAL  Final   Special Requests IN PEDIATRIC BOTTLE Blood Culture adequate volume  Final   Culture NO GROWTH 3 DAYS  Final   Report Status PENDING  Incomplete  Blood Culture ID Panel (Reflexed)     Status: Abnormal   Collection Time: 07/07/17  2:40 PM  Result Value Ref Range Status   Enterococcus species NOT DETECTED NOT DETECTED Final   Listeria monocytogenes NOT DETECTED NOT DETECTED Final   Staphylococcus species NOT DETECTED NOT DETECTED Final   Staphylococcus aureus NOT DETECTED NOT DETECTED Final   Streptococcus species NOT DETECTED NOT DETECTED Final   Streptococcus agalactiae NOT DETECTED NOT DETECTED Final    Streptococcus pneumoniae NOT DETECTED NOT DETECTED Final   Streptococcus pyogenes NOT DETECTED NOT DETECTED Final   Acinetobacter baumannii NOT DETECTED NOT DETECTED Final   Enterobacteriaceae species DETECTED (A) NOT DETECTED Final    Comment: Enterobacteriaceae represent a large family of gram-negative bacteria, not a single organism. CRITICAL RESULT CALLED TO, READ BACK BY AND VERIFIED WITH: M MACCIA,PHARMD AT 1239 07/09/17 BY L BENFIELD    Enterobacter cloacae complex NOT DETECTED NOT DETECTED Final   Escherichia coli NOT DETECTED NOT DETECTED Final   Klebsiella oxytoca NOT DETECTED NOT DETECTED Final   Klebsiella pneumoniae NOT DETECTED NOT DETECTED Final   Proteus species NOT DETECTED NOT DETECTED Final   Serratia marcescens DETECTED (A) NOT DETECTED Final    Comment: CRITICAL RESULT CALLED TO, READ BACK BY AND VERIFIED WITH: M MACCIA,PHARMD AT 1239 07/09/17 BY L BENFIELD    Carbapenem resistance NOT DETECTED NOT DETECTED Final   Haemophilus influenzae NOT DETECTED NOT DETECTED Final   Neisseria meningitidis NOT DETECTED NOT DETECTED Final   Pseudomonas aeruginosa NOT DETECTED NOT DETECTED Final   Candida albicans NOT DETECTED NOT DETECTED Final   Candida glabrata NOT DETECTED NOT DETECTED Final   Candida krusei NOT DETECTED NOT DETECTED Final   Candida parapsilosis NOT DETECTED NOT DETECTED Final   Candida tropicalis NOT DETECTED NOT DETECTED Final  Urine culture     Status: None   Collection Time: 07/07/17  5:15 PM  Result Value Ref Range Status   Specimen Description URINE, CLEAN CATCH  Final   Special Requests NONE  Final   Culture NO GROWTH  Final   Report Status 07/08/2017 FINAL  Final    Radiology Studies: No results found. Scheduled Meds: . FLUoxetine  20 mg Oral Daily  . folic acid  1 mg Oral Daily  .  gabapentin  100 mg Oral QHS  . heparin  5,000 Units Subcutaneous Q8H  . hydrALAZINE  50 mg Oral Q8H  . LORazepam  0-4 mg Oral Q12H  . multivitamin with minerals   1 tablet Oral Daily  . nicotine  21 mg Transdermal Daily  . thiamine  100 mg Oral Daily   Or  . thiamine  100 mg Intravenous Daily   Continuous Infusions: . cefTRIAXone (ROCEPHIN)  IV Stopped (07/09/17 1635)    LOS: 3 days   Kerney Elbe, DO [facility] Pager [phone]  If 7PM-7AM, please contact night-coverage [email or web address] Password [email or web address] 07/10/2017, 12:29 PM

## 2017-07-10 NOTE — Progress Notes (Addendum)
Subjective: No new complaints   Antibiotics:  Anti-infectives    Start     Dose/Rate Route Frequency Ordered Stop   07/08/17 0400  vancomycin (VANCOCIN) IVPB 750 mg/150 ml premix  Status:  Discontinued     750 mg 150 mL/hr over 60 Minutes Intravenous Every 12 hours 07/07/17 1905 07/09/17 1325   07/07/17 1645  cefTRIAXone (ROCEPHIN) 2 g in dextrose 5 % 50 mL IVPB     2 g 100 mL/hr over 30 Minutes Intravenous Every 24 hours 07/07/17 1639     07/07/17 1345  vancomycin (VANCOCIN) 1,500 mg in sodium chloride 0.9 % 500 mL IVPB  Status:  Discontinued     1,500 mg 250 mL/hr over 120 Minutes Intravenous  Once 07/07/17 1339 07/07/17 1513   07/07/17 1345  cefTRIAXone (ROCEPHIN) 2 g in dextrose 5 % 50 mL IVPB  Status:  Discontinued     2 g 100 mL/hr over 30 Minutes Intravenous  Once 07/07/17 1339 07/07/17 1519      Medications: Scheduled Meds: . FLUoxetine  20 mg Oral Daily  . folic acid  1 mg Oral Daily  . gabapentin  100 mg Oral QHS  . heparin  5,000 Units Subcutaneous Q8H  . hydrALAZINE  50 mg Oral Q8H  . LORazepam  0-4 mg Oral Q12H  . multivitamin with minerals  1 tablet Oral Daily  . nicotine  21 mg Transdermal Daily  . thiamine  100 mg Oral Daily   Or  . thiamine  100 mg Intravenous Daily   Continuous Infusions: . cefTRIAXone (ROCEPHIN)  IV Stopped (07/09/17 1635)   PRN Meds:.acetaminophen **OR** acetaminophen, hydrALAZINE, HYDROcodone-acetaminophen, HYDROmorphone (DILAUDID) injection, ketorolac, LORazepam **OR** LORazepam, ondansetron **OR** ondansetron (ZOFRAN) IV, polyethylene glycol, promethazine, zolpidem    Objective: Weight change:   Intake/Output Summary (Last 24 hours) at 07/10/17 1018 Last data filed at 07/10/17 0603  Gross per 24 hour  Intake              170 ml  Output                0 ml  Net              170 ml   Blood pressure 130/74, pulse 66, temperature 99.1 F (37.3 C), temperature source Oral, resp. rate 18, height '5\' 6"'$  (1.676 m),  weight 140 lb (63.5 kg), SpO2 96 %. Temp:  [98.7 F (37.1 C)-99.1 F (37.3 C)] 99.1 F (37.3 C) (10/01 0547) Pulse Rate:  [66-73] 66 (10/01 0547) Resp:  [16-18] 18 (10/01 0547) BP: (130-150)/(71-84) 130/74 (10/01 0547) SpO2:  [96 %-99 %] 96 % (10/01 0547)  Physical Exam: General: Alert and awake, oriented x3, not in any acute distress, dishivelled HEENT: anicteric sclera,  EOMI CVS regular rate, normal r,  no murmur rubs or gallops Chest: clear to auscultation bilaterally, no wheezing, rales or rhonchi Abdomen: soft nontender, nondistended, normal bowel sounds, Extremities: no  clubbing or edema noted bilaterally Skin: no new rashes Neuro: nonfocal  CBC:  CBC Latest Ref Rng & Units 07/10/2017 07/09/2017 07/08/2017  WBC 4.0 - 10.5 K/uL 9.2 8.5 9.8  Hemoglobin 13.0 - 17.0 g/dL 13.3 13.2 12.3(L)  Hematocrit 39.0 - 52.0 % 39.8 40.0 37.1(L)  Platelets 150 - 400 K/uL 344 327 290     BMET  Recent Labs  07/09/17 0350 07/10/17 0404  NA 139 138  K 3.5 3.8  CL 104 106  CO2 23 20*  GLUCOSE 107*  88  BUN 10 15  CREATININE 0.72 0.81  CALCIUM 8.8* 9.1     Liver Panel   Recent Labs  07/09/17 0350 07/10/17 0404  PROT 7.1 6.5  ALBUMIN 3.5 3.4*  AST 13* 11*  ALT 10* 10*  ALKPHOS 69 71  BILITOT 1.1 1.2       Sedimentation Rate No results for input(s): ESRSEDRATE in the last 72 hours. C-Reactive Protein No results for input(s): CRP in the last 72 hours.  Micro Results: Recent Results (from the past 720 hour(s))  Blood culture (routine x 2)     Status: None (Preliminary result)   Collection Time: 07/07/17  2:40 PM  Result Value Ref Range Status   Specimen Description BLOOD RIGHT ARM  Final   Special Requests   Final    BOTTLES DRAWN AEROBIC AND ANAEROBIC Blood Culture adequate volume   Culture  Setup Time   Final    GRAM NEGATIVE RODS ANAEROBIC BOTTLE ONLY CRITICAL RESULT CALLED TO, READ BACK BY AND VERIFIED WITH: M MACCIA,PHARMD AT 1239 07/09/17 BY L BENFIELD      Culture   Final    GRAM NEGATIVE RODS IDENTIFICATION AND SUSCEPTIBILITIES TO FOLLOW    Report Status PENDING  Incomplete  Blood culture (routine x 2)     Status: None (Preliminary result)   Collection Time: 07/07/17  2:40 PM  Result Value Ref Range Status   Specimen Description BLOOD RIGHT ANTECUBITAL  Final   Special Requests IN PEDIATRIC BOTTLE Blood Culture adequate volume  Final   Culture NO GROWTH 2 DAYS  Final   Report Status PENDING  Incomplete  Blood Culture ID Panel (Reflexed)     Status: Abnormal   Collection Time: 07/07/17  2:40 PM  Result Value Ref Range Status   Enterococcus species NOT DETECTED NOT DETECTED Final   Listeria monocytogenes NOT DETECTED NOT DETECTED Final   Staphylococcus species NOT DETECTED NOT DETECTED Final   Staphylococcus aureus NOT DETECTED NOT DETECTED Final   Streptococcus species NOT DETECTED NOT DETECTED Final   Streptococcus agalactiae NOT DETECTED NOT DETECTED Final   Streptococcus pneumoniae NOT DETECTED NOT DETECTED Final   Streptococcus pyogenes NOT DETECTED NOT DETECTED Final   Acinetobacter baumannii NOT DETECTED NOT DETECTED Final   Enterobacteriaceae species DETECTED (A) NOT DETECTED Final    Comment: Enterobacteriaceae represent a large family of gram-negative bacteria, not a single organism. CRITICAL RESULT CALLED TO, READ BACK BY AND VERIFIED WITH: M MACCIA,PHARMD AT 1239 07/09/17 BY L BENFIELD    Enterobacter cloacae complex NOT DETECTED NOT DETECTED Final   Escherichia coli NOT DETECTED NOT DETECTED Final   Klebsiella oxytoca NOT DETECTED NOT DETECTED Final   Klebsiella pneumoniae NOT DETECTED NOT DETECTED Final   Proteus species NOT DETECTED NOT DETECTED Final   Serratia marcescens DETECTED (A) NOT DETECTED Final    Comment: CRITICAL RESULT CALLED TO, READ BACK BY AND VERIFIED WITH: M MACCIA,PHARMD AT 1239 07/09/17 BY L BENFIELD    Carbapenem resistance NOT DETECTED NOT DETECTED Final   Haemophilus influenzae NOT DETECTED  NOT DETECTED Final   Neisseria meningitidis NOT DETECTED NOT DETECTED Final   Pseudomonas aeruginosa NOT DETECTED NOT DETECTED Final   Candida albicans NOT DETECTED NOT DETECTED Final   Candida glabrata NOT DETECTED NOT DETECTED Final   Candida krusei NOT DETECTED NOT DETECTED Final   Candida parapsilosis NOT DETECTED NOT DETECTED Final   Candida tropicalis NOT DETECTED NOT DETECTED Final  Urine culture     Status: None  Collection Time: 07/07/17  5:15 PM  Result Value Ref Range Status   Specimen Description URINE, CLEAN CATCH  Final   Special Requests NONE  Final   Culture NO GROWTH  Final   Report Status 07/08/2017 FINAL  Final    Studies/Results: No results found.    Assessment/Plan:  INTERVAL HISTORY: GNR growing from blood, still no sensis back   Principal Problem:   Compression fracture of thoracic vertebra (HCC) Active Problems:   Substance induced mood disorder (HCC)   Hepatic steatosis   Tobacco abuse   History of hepatitis C   History of COPD   MDD (major depressive disorder), recurrent severe, without psychosis ([place])   IVDU (intravenous drug user)   Back pain   Lipoma   Hypokalemia   Bradycardia   Discitis thoracic region   Osteomyelitis (HCC)    Gregory Price is a 66 y.o. male with  IVDU, treated HCV admitted with T5 diskitis and osteomyelitis of vertebra with Serratia growing from blood cultures.(by BCID)  --repeat blood cultures to ensure clearing --continue CTX 2 grams daily --check ESR, CRP  Hx of HCV: HCV RNA ND so he has been cured. He claims not to share needles now  IVDU: he is also homeless. I will talk to [facility] faculty re potential suboxone clinic for him       LOS: 3 days   Alcide Evener 07/10/2017, 10:18 AM

## 2017-07-11 LAB — COMPREHENSIVE METABOLIC PANEL
ALT: 14 U/L — ABNORMAL LOW (ref 17–63)
ANION GAP: 12 (ref 5–15)
AST: 17 U/L (ref 15–41)
Albumin: 3.3 g/dL — ABNORMAL LOW (ref 3.5–5.0)
Alkaline Phosphatase: 80 U/L (ref 38–126)
BUN: 18 mg/dL (ref 6–20)
CHLORIDE: 101 mmol/L (ref 101–111)
CO2: 23 mmol/L (ref 22–32)
Calcium: 9 mg/dL (ref 8.9–10.3)
Creatinine, Ser: 0.82 mg/dL (ref 0.61–1.24)
GFR calc non Af Amer: >60 mL/min (ref >60)
Glucose, Bld: 98 mg/dL (ref 65–99)
Potassium: 3.9 mmol/L (ref 3.5–5.1)
SODIUM: 136 mmol/L (ref 135–145)
Total Bilirubin: 1.1 mg/dL (ref 0.3–1.2)
Total Protein: 6.4 g/dL — ABNORMAL LOW (ref 6.5–8.1)

## 2017-07-11 LAB — CBC WITH DIFFERENTIAL/PLATELET
Basophils Absolute: 0.1 10*3/uL (ref 0.0–0.1)
Basophils Relative: 1 %
EOS PCT: 2 %
Eosinophils Absolute: 0.2 10*3/uL (ref 0.0–0.7)
HCT: 38.2 % — ABNORMAL LOW (ref 39.0–52.0)
Hemoglobin: 13 g/dL (ref 13.0–17.0)
LYMPHS ABS: 1.5 10*3/uL (ref 0.7–4.0)
Lymphocytes Relative: 20 %
MCH: 27.9 pg (ref 26.0–34.0)
MCHC: 34 g/dL (ref 30.0–36.0)
MCV: 82 fL (ref 78.0–100.0)
MONOS PCT: 6 %
Monocytes Absolute: 0.5 10*3/uL (ref 0.1–1.0)
Neutro Abs: 5.6 10*3/uL (ref 1.7–7.7)
Neutrophils Relative %: 71 %
PLATELETS: 329 10*3/uL (ref 150–400)
RBC: 4.66 MIL/uL (ref 4.22–5.81)
RDW: 13.5 % (ref 11.5–15.5)
WBC: 7.8 10*3/uL (ref 4.0–10.5)

## 2017-07-11 LAB — PHOSPHORUS: Phosphorus: 4 mg/dL (ref 2.5–4.6)

## 2017-07-11 LAB — CULTURE, BLOOD (ROUTINE X 2): Special Requests: ADEQUATE

## 2017-07-11 LAB — MAGNESIUM: MAGNESIUM: 2.1 mg/dL (ref 1.7–2.4)

## 2017-07-11 LAB — C-REACTIVE PROTEIN: CRP: 0.9 mg/dL (ref <1.0)

## 2017-07-11 LAB — SEDIMENTATION RATE: Sed Rate: 33 mm/hr — ABNORMAL HIGH (ref 0–16)

## 2017-07-11 MED ORDER — CIPROFLOXACIN HCL 500 MG PO TABS: 500.0000 mg | ORAL_TABLET | Freq: Two times a day (BID) | ORAL | Status: DC

## 2017-07-11 MED ORDER — ADULT MULTIVITAMIN W/MINERALS CH: 1.0000 | ORAL_TABLET | Freq: Every day | ORAL | 0 refills | Status: AC

## 2017-07-11 MED ORDER — LORAZEPAM 1 MG PO TABS
0.0000 mg | ORAL_TABLET | Freq: Two times a day (BID) | ORAL | Status: DC
  Filled 2017-07-11: qty 1

## 2017-07-11 MED ORDER — FOLIC ACID 1 MG PO TABS: 1.0000 mg | ORAL_TABLET | Freq: Every day | ORAL | 0 refills | Status: AC

## 2017-07-11 MED ORDER — THIAMINE HCL 100 MG PO TABS: 100.0000 mg | ORAL_TABLET | Freq: Every day | ORAL | 0 refills | Status: AC

## 2017-07-11 MED ORDER — CIPROFLOXACIN HCL 500 MG PO TABS: 500.0000 mg | ORAL_TABLET | Freq: Two times a day (BID) | ORAL | 0 refills | Status: DC

## 2017-07-11 MED ORDER — CIPROFLOXACIN HCL 750 MG PO TABS
750.0000 mg | ORAL_TABLET | Freq: Two times a day (BID) | ORAL | Status: DC
  Administered 2017-07-11: 750 mg via ORAL
  Filled 2017-07-11: qty 1

## 2017-07-11 MED ORDER — FLUOXETINE HCL 20 MG PO CAPS: 20.0000 mg | ORAL_CAPSULE | Freq: Every day | ORAL | 0 refills | Status: DC

## 2017-07-11 MED ORDER — GABAPENTIN 100 MG PO CAPS: 100.0000 mg | ORAL_CAPSULE | Freq: Every day | ORAL | 0 refills | Status: DC

## 2017-07-11 MED ORDER — IBUPROFEN 200 MG PO TABS: 200.0000 mg | ORAL_TABLET | Freq: Three times a day (TID) | ORAL | 0 refills | Status: AC | PRN

## 2017-07-11 MED ORDER — NICOTINE 21 MG/24HR TD PT24: 21.0000 mg | MEDICATED_PATCH | Freq: Every day | TRANSDERMAL | 0 refills | Status: AC

## 2017-07-11 MED ORDER — POLYETHYLENE GLYCOL 3350 17 G PO PACK: 17.0000 g | PACK | Freq: Every day | ORAL | 0 refills | Status: AC | PRN

## 2017-07-11 MED ORDER — LORAZEPAM 1 MG PO TABS
1.0000 mg | ORAL_TABLET | Freq: Once | ORAL | Status: AC
  Administered 2017-07-11: 1 mg via ORAL
  Filled 2017-07-11: qty 1

## 2017-07-11 MED ORDER — BUPRENORPHINE HCL-NALOXONE HCL 8-2 MG SL SUBL: 1.0000 | SUBLINGUAL_TABLET | Freq: Two times a day (BID) | SUBLINGUAL | 0 refills | Status: DC

## 2017-07-11 MED ORDER — SENNOSIDES-DOCUSATE SODIUM 8.6-50 MG PO TABS: 1.0000 | ORAL_TABLET | Freq: Every day | ORAL | 0 refills | Status: AC

## 2017-07-11 MED FILL — FOLIC ACID 1 MG TABLET: 1 | 30 days supply | Qty: 30 | Fill #0

## 2017-07-11 MED FILL — POLYETHYLENE GLYCOL 3350: 30 days supply | Qty: 285 | Fill #0

## 2017-07-11 MED FILL — GABAPENTIN 100 MG CAPSULE: 100 | 30 days supply | Qty: 30 | Fill #0

## 2017-07-11 MED FILL — NICOTINE 21 MG/24HR PATCH: 21 | 28 days supply | Qty: 28 | Fill #0

## 2017-07-11 MED FILL — CIPROFLOXACIN HCL 500 MG TA: 500 | 41 days supply | Qty: 83 | Fill #0

## 2017-07-11 MED FILL — ?FLUOXETINE HCL 20 MG CAP: 20 | 30 days supply | Qty: 30 | Fill #0

## 2017-07-11 NOTE — Progress Notes (Signed)
Patient discharge teaching given, including activity, diet, follow-up appoints, and medications. Patient verbalized understanding of all discharge instructions. IV access was d/c'd. Vitals are stable. Skin is intact except as charted in most recent assessments. Patient discharge prolonged due to awaiting resources for patient prescriptions, etc.  Pt to be escorted out by NT with taxi vouchers.

## 2017-07-11 NOTE — Progress Notes (Addendum)
CM spoke with pt @ bedside with [place] Worker/ Gregory Price [phone]) on speaker phone. Gregory SW explained to pt in order for pt to receive [facility] services, pt will need to schedule an appointment to see a PCP within the [place] system. Pt states homeless and has no cell phone. Pt states his daughters live in [place] and his plan is to get closer to [place] that way daughters could help assist him to the [place]. Gregory SW shared with pt the oncologist service has tried to reach him since May. CM wrote contact information given by Gregory Price SW for pt  to f/u with the [facility] oncologist, and [facility] SW's contact information. Pt is to call [facility] SW once he has daughter's phone number and address arrange PCP appointment. Whitman Hero RN,BSN,CM

## 2017-07-11 NOTE — Progress Notes (Addendum)
Internal Medicine Clinic Attending  I was asked to see Mr. Babler by Dr. 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  Mr. Korff is a 66yo man here for T5 compression fracture of the thoracic spin with discitis, osteomyelitis from [place].  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  He first started using in the 70s but only in the last few years has he been a daily user.  His use started with oral narcotics given to him for pain issues and progressed to heroin use.  He frequently used prescription at a faster rate than prescribed and would then seek out illicit ways to obtain narcotics.  He has used other substances intermittently as well, but not daily.  He has had no other complications of his IV drug use except for this instance.  He has previously been sober for about 6 months last year when he did an abstinence program through [facility] in [place].  He is very interested in MAT today and wants to stop the IV drug use.  He has tried suboxone off the street in the past to help with dope sickness, but unfortunately also had suboxone cause acute withdrawal because he took it too early after a heroin dose in the past as well.  He is currently homeless and has a friend who will sometimes let him stay at her house.  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  He currently is not in withdrawal, but has been receiving hydrocodone based products for his understandable pain associated with his thoracic fracture.    On exam he is comfortable, afebrile  with stable vital signs today.  He is elderly, appearing stated age.  He has no murmur and a regular rhythm.  He is breathing comfortably on room air.  Bowel sounds present with some tenderness to palpation due to hernias per him.    He has a strong desire to be abstinent.  He has some barriers including limited monetary funds, lack of permanent housing, but I think he could be treated with Suboxone.  We discussed how to do a home induction if he were to be discharged today.  We could also start suboxone here in the hospital, his short acting narcotics would need to be held in the short term.    Discussed with his primary provider Dr. Alfredia Ferguson on the phone and plan will be to discharge him today.  Will provide him with 1 week supply of generic suboxone and an appointment in our clinic on 07/18/17.  Will provide him with phone number of clinic in order to ensure he can call with any issues and instructions on how to proceed with home induction to suboxone.  He was not able to provide me with a phone number at this time, but I asked him to call the clinic immediately with any issues.

## 2017-07-11 NOTE — Discharge Summary (Signed)
Physician Discharge Summary  Gregory Price IZT:[id number] DOB: Nov 15, 1950 DOA: 07/07/2017  PCP: System, Provider Not In  Admit date: 07/07/2017 Discharge date: 07/11/2017  Admitted From: Home Disposition:  [facility]  Recommendations for Outpatient Follow-up:  1. Follow up with and establish with PCP in 1-2 weeks 2. Follow up with Dr. Daryll Drown at [facility] 3. Follow up with Neurosurgery in 4 weeks for Repeat Back X-Rays 4. Follow up with Infectious Disease Clinic within 4 weeks 5. Please obtain CMP/CBC, Mag in one week  Home Health: No Equipment/Devices: No    Discharge Condition: Guarded CODE STATUS: FULL CODE Diet recommendation: Heart Healthy Diet  Brief/Interim Summary: Gregory Price a 66 y.o.malewith medical history significant for daily IV heroin use, Tobacco use, COPD, Hepatitis C who presents to the emergency Department chief complaint persistent worsening back pain. Initial evaluation includes an MRI revealing T5 compression fracture as well as extensive edema throughout the T5 and T6 vertebral bodies and intervening disc signal is concerning for infectious discitis/osteomyelitis. [facility] hospitalists were asked to admit.  Hx was obtained from the patient. He described a three-week history of persistent worsening mid thoracic spine pain. He describes the pain as constant pressure and sharp like worsening with movement and breathing. He states he has been taking ibuprofen every 3 hours or so with little improvement. He denies any recent trauma. He did endorse persistent long-standing constipation. Is unsure when his last bowel movement was. He denies dysuria hematuria frequency or urgency. He is a daily IV heroin user and injects approximately 3 times a day. Was admitted for back pain and being treated for Discitis/Osteo and on IV Abx. ID was following. Patient found to have a bacteremia from Serratia Marcescens was sensitive to Ciprofloxacin so ID  recommended treatment for 6 weeks. ID placed consult  Suboxone Clinic and Dr. Daryll Drown evaluated and arranged outpatient follow up. Patient was deemed medically stable to D/C and will need to follow up and establish with PCP, Follow up with ID in 4 weeks, Follow up with Neurosurgery in 4 weeks, an with Suboxone Clinic as an outpatient.   Discharge Diagnoses:  Principal Problem:   Compression fracture of thoracic vertebra (HCC) Active Problems:   Substance induced mood disorder (HCC)   Hepatic steatosis   Tobacco abuse   History of hepatitis C   History of COPD   MDD (major depressive disorder), recurrent severe, without psychosis ([place])   IVDU (intravenous drug user)   Back pain   Lipoma   Hypokalemia   Bradycardia   Discitis thoracic region   Vertebral osteomyelitis (HCC)   Serratia marcescens infection (HCC)   Gram-negative bacteremia   History of intravenous drug abuse  T5 Compression Fracture of Thoracic Spine in the setting of Discitis and Osteomyelitis and GNR Bacteremia -Currently afebrile nontoxic appearing.  -Mild leukocytosis improved from 11.7 -> 9.8 -> 8.5 -> 9.2 -> 7.8 -MRI T Spine showed Moderate to severe T5 compression fracture deformity. Extensive edema throughout the T5 and T6 vertebral bodies and intervening disc signal is concerning for infectious discitis/osteomyelitis given the clinical history and surrounding paravertebral soft tissue edema. While there is slight wedging of the T6 vertebral body, purely posttraumatic edema without infection is felt less likely. No evidence of epidural abscess. Large lipoma in the left posterior paraspinal cervicothoracic musculature. -Neurosurgery consulted who recommended Consulting IR for possible Aspiration and ID for Empric Abx. No Neurosurgical intervention was indicated and Neurosurgery signed off and will need f/u as an outpatient in 4 weeks for  Thoracic X-Rays -Patient Was provided with Rocephin and Vancomycin in the emergency  department.  -IR consulted and felt the level was too high to access safely so IR Dr. Andrey Cota the procedure -ID consulted and Antibiotics per Infectious Disease.  -Ceftriaxone 2 grams q24h changed to po Ciprofloxacin 500 mg po BID x 6 weeks; Vancomycin 750 mg q12h stopped by ID given blood Cx of Serratia  -Will not likely be candidate for long term OP IV antibiotics given being an IV Drug User.  -Follow Blood Cultures and Blood Cx 07/07/17 showed 1/2 Cx with GNR Enterobacteriaceae Species and was Serratia Marcescens with Sensitivities to Ciprofloxacin so will be D/C'd on 500 mg po BID and follow up with ID Clinic in 4 weeks  -Sensitivities from Blood Cx pending to help determine long term po Therapy  -Infectious Diseases repeating Blood Cultures on 07/10/2017 and are pending  -HIV was Non-Reactive; Hep C was >11.0 so Hep C quant sent and Negative  -Pain Control with Suboxone now and c/w Home Gabapentin  -Per ID would not place IV PICC LINE; Dr. Betsy Coder Consulted by ID for Suboxone Clinic and will follow up with her as an outpatient   Bradycardia.  -Heart rate ranged 47-52 but now improved to 63-73. Chart review indicates this is his baseline.  -Monitor on Telemetry -Obtained a TSH and was 0.214 -Follow up as an outpatient   Hypokalemia/Hypomagnesemia, improved -Likely related to decreased oral intake. Mild. -K+ was 3.9 and Mag was 2.1 -Continue to Monitor and Replete as Necessary -Repeat CMP and Mag Level as an outpatient   Current IV Drug User.  -Patient reports injecting Heroin 3-4 times a day. Most recent injection the evening before Admission. -CIWA protocol with scheduled ativan as well as prn -Social work [place] for [employer] Abuse Counseling  -C/w Folic Acid, MVI, and Thiamine  -IVF now D/C'd -ID discussed with [facility] IM Faculty Dr. Betsy Coder for Suboxone Clinic Evaluation and patient will go to [facility] at D/C   Hypertension -Only fair control in the  emergency department. Likely related to pain in the setting of early withdrawal.  -BP was 130/74 this AM -Avoided Clonidine given tendency for bradycardia -Follow up and establish with a PCP for further management   COPD  -Not on home oxygen.  -Continues to smoke; Smoking cessation counseling given -Chest x-ray right base mild pleural thickening noted most consistent scarring. Small right pleural effusion cannot be excluded. No focal infiltrate noted.Oxygen saturation level greater than 90% on room air -C/w Nicoderm 21 mg TD patch q24h at D/C  Hepatitis C -Was previously treated -HCV Ab was >11.0;  -Checked HCV Quantitative and was Negative -Appreciate ID input and it appears patient was adequately treated -Follow up with ID as an outpatient   Depression -C/w Fluoxetine 20 mg po qHS  Constipation -Improved -C/w Miralax 17 po Daily prn -Added Senna-Docusate 1 tab po qHS for D/C  Discharge Instructions  Discharge Instructions    Call MD for:  difficulty breathing, headache or visual disturbances    Complete by:  As directed    Call MD for:  extreme fatigue    Complete by:  As directed    Call MD for:  hives    Complete by:  As directed    Call MD for:  persistant dizziness or light-headedness    Complete by:  As directed    Call MD for:  persistant nausea and vomiting    Complete by:  As directed  Call MD for:  redness, tenderness, or signs of infection (pain, swelling, redness, odor or green/yellow discharge around incision site)    Complete by:  As directed    Call MD for:  severe uncontrolled pain    Complete by:  As directed    Call MD for:  temperature >100.4    Complete by:  As directed    Diet - low sodium heart healthy    Complete by:  As directed    Discharge instructions    Complete by:  As directed    Follow up with an Establish with PCP and follow up with Dr. Daryll Drown in [facility]. Substance Abuse Counseling provided for you by the Education officer, museum.  Take all medications as prescribed. If symptoms change or worsen please return to the ED for evaluation.   Increase activity slowly    Complete by:  As directed      Allergies as of 07/11/2017      Reactions   Bee Venom Anaphylaxis      Medication List    TAKE these medications   buprenorphine-naloxone 8-2 mg Subl SL tablet Commonly known as:  SUBOXONE Place 1 tablet under the tongue 2 (two) times daily.   ciprofloxacin 500 MG tablet Commonly known as:  CIPRO Take 1 tablet (500 mg total) by mouth 2 (two) times daily.   FLUoxetine 20 MG capsule Commonly known as:  PROZAC Take 1 capsule (20 mg total) by mouth daily.   folic acid 1 MG tablet Commonly known as:  FOLVITE Take 1 tablet (1 mg total) by mouth daily.   gabapentin 100 MG capsule Commonly known as:  NEURONTIN Take 1 capsule (100 mg total) by mouth at bedtime.   ibuprofen 200 MG tablet Commonly known as:  ADVIL,MOTRIN Take 1 tablet (200 mg total) by mouth every 8 (eight) hours as needed (for pain). What changed:  how much to take  when to take this   multivitamin with minerals Tabs tablet Take 1 tablet by mouth daily.   nicotine 21 mg/24hr patch Commonly known as:  NICODERM CQ - dosed in mg/24 hours Place 1 patch (21 mg total) onto the skin daily.   polyethylene glycol packet Commonly known as:  MIRALAX / GLYCOLAX Take 17 g by mouth daily as needed for mild constipation.   senna-docusate 8.6-50 MG tablet Commonly known as:  Senokot-S Take 1 tablet by mouth at bedtime.   thiamine 100 MG tablet Take 1 tablet (100 mg total) by mouth daily.      Follow-up Information    [place]. Go in 7 day(s).   Why:  Please come for Suboxone Clinic on 07/18/17 at 1115am.  Please call the clinic at number listed if not able to come.  Contact information: [address] [place] [phone]       Campbell Riches, MD Follow up in 4 week(s).   Specialty:   Infectious Diseases Why:  Follow up within 4 weeks Contact information: [place] [address] [phone]        Ditty, Kevan Ny, MD Follow up in 4 week(s).   Specialty:  Neurosurgery Why:  Follow up for Repeat X-Rays Contact information: [address] [place] [address] [phone]          Allergies  Allergen Reactions  . Bee Venom Anaphylaxis   Consultations:  Neurosurgery  Infectious Diseases  Dr. Daryll Drown with [facility]  Procedures/Studies: Dg Chest 2 View  Result Date: 07/07/2017 CLINICAL DATA:  Chest pain.  Back pain . EXAM: CHEST  2 VIEW COMPARISON:  CT 08/09/2014. FINDINGS: Mediastinum and hilar structures normal periods mild cardiomegaly. No pulmonary venous congestion. No focal infiltrate. Mild right base pleural thickening noted most likely secondary to scarring. Small right pleural effusion cannot be excluded. Surgical clips upper chest. IMPRESSION: 1. Right base mild pleural thickening noted most consistent scarring. Small right pleural effusion cannot be excluded. No focal infiltrate noted. 2.  Cardiomegaly.  No pulmonary venous congestion . Electronically Signed   By: Marcello Moores  Register   On: 07/07/2017 07:35   Mr Thoracic Spine Wo Contrast  Result Date: 07/07/2017 CLINICAL DATA:  Thoracic back pain. IV drug use. Concern for epidural abscess. EXAM: MRI THORACIC SPINE WITHOUT CONTRAST TECHNIQUE: Multiplanar, multisequence MR imaging of the thoracic spine was performed. No intravenous contrast was administered. COMPARISON:  Chest radiographs 07/07/2017 FINDINGS: Due to patient pain, the study is motion degraded despite medication and multiple repeated imaging attempts. Alignment: Exaggerated thoracic kyphosis centered at T5-6. No listhesis. Vertebrae: There is edema diffusely throughout the T5 and T6 vertebral bodies. There is anterior wedging of the T5 vertebral body with approximately 70% height loss. Slight  anterior wedging is noted of the T3, T4, T6, and T7 vertebral bodies. There is abnormal fluid signal in the T5-6 disc space with edema in the surrounding paravertebral soft tissues. There may be trace ventral epidural phlegmon or hematoma at T5-6, however assessment is limited by motion artifact and no sizable epidural fluid collection is seen to indicate drainable abscess. Cord: Normal signal and morphology within limitations of motion artifact. Paraspinal and other soft tissues: Lipoma within the posterior left paraspinal musculature from C5-T3 measuring approximately 7 cm in length an approximately 3 x 3 cm transversely. Trace bilateral pleural effusions. Disc levels: Disc degeneration at C5-6 and C6-7 with moderate disc space narrowing, disc bulging, and spurring, only imaged sagittally and incompletely evaluated though without high-grade spinal stenosis or frank cord compression. No sizable disc herniation or spinal stenosis in the thoracic spine. Mild thoracic facet arthrosis without evidence of significant neural foraminal stenosis. IMPRESSION: 1. Moderate to severe T5 compression fracture deformity. Extensive edema throughout the T5 and T6 vertebral bodies and intervening disc signal is concerning for infectious discitis/osteomyelitis given the clinical history and surrounding paravertebral soft tissue edema. While there is slight wedging of the T6 vertebral body, purely posttraumatic edema without infection is felt less likely. No evidence of epidural abscess. 2. Large lipoma in the left posterior paraspinal cervicothoracic musculature. Electronically Signed   By: Logan Bores M.D.   On: 07/07/2017 13:12    Subjective: Seen and examined and was complaining of some back pain. No CP or SOB. Wanting to start treatment with Suboxone.   Discharge Exam: Vitals:   07/11/17 0511 07/11/17 1218  BP: (!) 125/58 (!) 97/49  Pulse: 62 63  Resp: 18 18  Temp: 98.3 F (36.8 C) 98 F (36.7 C)  SpO2: 100% 98%    Vitals:   07/10/17 2238 07/11/17 0042 07/11/17 0511 07/11/17 1218  BP: (!) 120/58 122/62 (!) 125/58 (!) 97/49  Pulse: 61 66 62 63  Resp:  18 18 18   Temp:  98.2 F (36.8 C) 98.3 F (36.8 C) 98 F (36.7 C)  TempSrc:  Oral Oral Oral  SpO2:  97% 100% 98%  Weight:      Height:       General: Pt is alert, awake, not  in acute distress Cardiovascular: RRR, S1/S2 +, no rubs, no gallops Respiratory: CTA bilaterally, no wheezing, no rhonchi Abdominal: Soft, NT, ND, bowel sounds +; Has abdominal hernias Extremities: no edema, no cyanosis  The results of significant diagnostics from this hospitalization (including imaging, microbiology, ancillary and laboratory) are listed below for reference.    Microbiology: Recent Results (from the past 240 hour(s))  Blood culture (routine x 2)     Status: Abnormal   Collection Time: 07/07/17  2:40 PM  Result Value Ref Range Status   Specimen Description BLOOD RIGHT ARM  Final   Special Requests   Final    BOTTLES DRAWN AEROBIC AND ANAEROBIC Blood Culture adequate volume   Culture  Setup Time   Final    GRAM NEGATIVE RODS ANAEROBIC BOTTLE ONLY CRITICAL RESULT CALLED TO, READ BACK BY AND VERIFIED WITH: M MACCIA,PHARMD AT 1239 07/09/17 BY L BENFIELD    Culture SERRATIA MARCESCENS (A)  Final   Report Status 07/11/2017 FINAL  Final   Organism ID, Bacteria SERRATIA MARCESCENS  Final      Susceptibility   Serratia marcescens - MIC*    CEFAZOLIN >=64 RESISTANT Resistant     CEFEPIME <=1 SENSITIVE Sensitive     CEFTAZIDIME <=1 SENSITIVE Sensitive     CEFTRIAXONE <=1 SENSITIVE Sensitive     CIPROFLOXACIN <=0.25 SENSITIVE Sensitive     GENTAMICIN <=1 SENSITIVE Sensitive     TRIMETH/SULFA <=20 SENSITIVE Sensitive     * SERRATIA MARCESCENS  Blood culture (routine x 2)     Status: None (Preliminary result)   Collection Time: 07/07/17  2:40 PM  Result Value Ref Range Status   Specimen Description BLOOD RIGHT ANTECUBITAL  Final   Special Requests IN  PEDIATRIC BOTTLE Blood Culture adequate volume  Final   Culture NO GROWTH 4 DAYS  Final   Report Status PENDING  Incomplete  Blood Culture ID Panel (Reflexed)     Status: Abnormal   Collection Time: 07/07/17  2:40 PM  Result Value Ref Range Status   Enterococcus species NOT DETECTED NOT DETECTED Final   Listeria monocytogenes NOT DETECTED NOT DETECTED Final   Staphylococcus species NOT DETECTED NOT DETECTED Final   Staphylococcus aureus NOT DETECTED NOT DETECTED Final   Streptococcus species NOT DETECTED NOT DETECTED Final   Streptococcus agalactiae NOT DETECTED NOT DETECTED Final   Streptococcus pneumoniae NOT DETECTED NOT DETECTED Final   Streptococcus pyogenes NOT DETECTED NOT DETECTED Final   Acinetobacter baumannii NOT DETECTED NOT DETECTED Final   Enterobacteriaceae species DETECTED (A) NOT DETECTED Final    Comment: Enterobacteriaceae represent a large family of gram-negative bacteria, not a single organism. CRITICAL RESULT CALLED TO, READ BACK BY AND VERIFIED WITH: M MACCIA,PHARMD AT 1239 07/09/17 BY L BENFIELD    Enterobacter cloacae complex NOT DETECTED NOT DETECTED Final   Escherichia coli NOT DETECTED NOT DETECTED Final   Klebsiella oxytoca NOT DETECTED NOT DETECTED Final   Klebsiella pneumoniae NOT DETECTED NOT DETECTED Final   Proteus species NOT DETECTED NOT DETECTED Final   Serratia marcescens DETECTED (A) NOT DETECTED Final    Comment: CRITICAL RESULT CALLED TO, READ BACK BY AND VERIFIED WITH: M MACCIA,PHARMD AT 1239 07/09/17 BY L BENFIELD    Carbapenem resistance NOT DETECTED NOT DETECTED Final   Haemophilus influenzae NOT DETECTED NOT DETECTED Final   Neisseria meningitidis NOT DETECTED NOT DETECTED Final   Pseudomonas aeruginosa NOT DETECTED NOT DETECTED Final   Candida albicans NOT DETECTED NOT DETECTED Final   Candida glabrata NOT  DETECTED NOT DETECTED Final   Candida krusei NOT DETECTED NOT DETECTED Final   Candida parapsilosis NOT DETECTED NOT DETECTED Final    Candida tropicalis NOT DETECTED NOT DETECTED Final  Urine culture     Status: None   Collection Time: 07/07/17  5:15 PM  Result Value Ref Range Status   Specimen Description URINE, CLEAN CATCH  Final   Special Requests NONE  Final   Culture NO GROWTH  Final   Report Status 07/08/2017 FINAL  Final  Culture, blood (Routine X 2) w Reflex to ID Panel     Status: None (Preliminary result)   Collection Time: 07/10/17 10:33 AM  Result Value Ref Range Status   Specimen Description BLOOD RIGHT HAND  Final   Special Requests IN PEDIATRIC BOTTLE Blood Culture adequate volume  Final   Culture NO GROWTH 1 DAY  Final   Report Status PENDING  Incomplete  Culture, blood (Routine X 2) w Reflex to ID Panel     Status: None (Preliminary result)   Collection Time: 07/10/17 10:33 AM  Result Value Ref Range Status   Specimen Description BLOOD RIGHT HAND  Final   Special Requests IN PEDIATRIC BOTTLE Blood Culture adequate volume  Final   Culture NO GROWTH 1 DAY  Final   Report Status PENDING  Incomplete    Labs: BNP (last 3 results) No results for input(s): BNP in the last 8760 hours. Basic Metabolic Panel:  Recent Labs Lab 07/07/17 0811 07/07/17 1435 07/08/17 0521 07/09/17 0350 07/10/17 0404 07/11/17 0455  NA 140  --  140 139 138 136  K 3.2*  --  3.3* 3.5 3.8 3.9  CL 105  --  106 104 106 101  CO2 24  --  24 23 20* 23  GLUCOSE 107*  --  95 107* 88 98  BUN 9  --  8 10 15 18   CREATININE 0.75  --  0.70 0.72 0.81 0.82  CALCIUM 8.8*  --  8.8* 8.8* 9.1 9.0  MG  --  1.6*  --  2.1 2.1 2.1  PHOS  --  2.8  --  3.0 4.0 4.0   Liver Function Tests:  Recent Labs Lab 07/08/17 0521 07/09/17 0350 07/10/17 0404 07/11/17 0455  AST 13* 13* 11* 17  ALT 10* 10* 10* 14*  ALKPHOS 66 69 71 80  BILITOT 0.9 1.1 1.2 1.1  PROT 6.5 7.1 6.5 6.4*  ALBUMIN 3.2* 3.5 3.4* 3.3*   No results for input(s): LIPASE, AMYLASE in the last 168 hours. No results for input(s): AMMONIA in the last 168  hours. CBC:  Recent Labs Lab 07/07/17 0811 07/08/17 0521 07/09/17 0350 07/10/17 0404 07/11/17 0455  WBC 11.7* 9.8 8.5 9.2 7.8  NEUTROABS  --   --  6.6 6.5 5.6  HGB 11.2* 12.3* 13.2 13.3 13.0  HCT 34.4* 37.1* 40.0 39.8 38.2*  MCV 84.9 83.4 82.8 82.7 82.0  PLT 265 290 327 344 329   Cardiac Enzymes: No results for input(s): CKTOTAL, CKMB, CKMBINDEX, TROPONINI in the last 168 hours. BNP: Invalid input(s): POCBNP CBG: No results for input(s): GLUCAP in the last 168 hours. D-Dimer No results for input(s): DDIMER in the last 72 hours. Hgb A1c No results for input(s): HGBA1C in the last 72 hours. Lipid Profile No results for input(s): CHOL, HDL, LDLCALC, TRIG, CHOLHDL, LDLDIRECT in the last 72 hours. Thyroid function studies No results for input(s): TSH, T4TOTAL, T3FREE, THYROIDAB in the last 72 hours.  Invalid input(s): FREET3 Anemia work up  No results for input(s): VITAMINB12, FOLATE, FERRITIN, TIBC, IRON, RETICCTPCT in the last 72 hours. Urinalysis    Component Value Date/Time   COLORURINE STRAW (A) 07/07/2017 1715   APPEARANCEUR CLEAR 07/07/2017 1715   LABSPEC 1.004 (L) 07/07/2017 1715   PHURINE 7.0 07/07/2017 1715   GLUCOSEU NEGATIVE 07/07/2017 1715   HGBUR NEGATIVE 07/07/2017 1715   BILIRUBINUR NEGATIVE 07/07/2017 1715   KETONESUR NEGATIVE 07/07/2017 1715   PROTEINUR NEGATIVE 07/07/2017 1715   UROBILINOGEN 1.0 08/09/2014 1519   NITRITE NEGATIVE 07/07/2017 1715   LEUKOCYTESUR NEGATIVE 07/07/2017 1715   Sepsis Labs Invalid input(s): PROCALCITONIN,  WBC,  LACTICIDVEN Microbiology Recent Results (from the past 240 hour(s))  Blood culture (routine x 2)     Status: Abnormal   Collection Time: 07/07/17  2:40 PM  Result Value Ref Range Status   Specimen Description BLOOD RIGHT ARM  Final   Special Requests   Final    BOTTLES DRAWN AEROBIC AND ANAEROBIC Blood Culture adequate volume   Culture  Setup Time   Final    GRAM NEGATIVE RODS ANAEROBIC BOTTLE ONLY CRITICAL  RESULT CALLED TO, READ BACK BY AND VERIFIED WITH: M MACCIA,PHARMD AT 1239 07/09/17 BY L BENFIELD    Culture SERRATIA MARCESCENS (A)  Final   Report Status 07/11/2017 FINAL  Final   Organism ID, Bacteria SERRATIA MARCESCENS  Final      Susceptibility   Serratia marcescens - MIC*    CEFAZOLIN >=64 RESISTANT Resistant     CEFEPIME <=1 SENSITIVE Sensitive     CEFTAZIDIME <=1 SENSITIVE Sensitive     CEFTRIAXONE <=1 SENSITIVE Sensitive     CIPROFLOXACIN <=0.25 SENSITIVE Sensitive     GENTAMICIN <=1 SENSITIVE Sensitive     TRIMETH/SULFA <=20 SENSITIVE Sensitive     * SERRATIA MARCESCENS  Blood culture (routine x 2)     Status: None (Preliminary result)   Collection Time: 07/07/17  2:40 PM  Result Value Ref Range Status   Specimen Description BLOOD RIGHT ANTECUBITAL  Final   Special Requests IN PEDIATRIC BOTTLE Blood Culture adequate volume  Final   Culture NO GROWTH 4 DAYS  Final   Report Status PENDING  Incomplete  Blood Culture ID Panel (Reflexed)     Status: Abnormal   Collection Time: 07/07/17  2:40 PM  Result Value Ref Range Status   Enterococcus species NOT DETECTED NOT DETECTED Final   Listeria monocytogenes NOT DETECTED NOT DETECTED Final   Staphylococcus species NOT DETECTED NOT DETECTED Final   Staphylococcus aureus NOT DETECTED NOT DETECTED Final   Streptococcus species NOT DETECTED NOT DETECTED Final   Streptococcus agalactiae NOT DETECTED NOT DETECTED Final   Streptococcus pneumoniae NOT DETECTED NOT DETECTED Final   Streptococcus pyogenes NOT DETECTED NOT DETECTED Final   Acinetobacter baumannii NOT DETECTED NOT DETECTED Final   Enterobacteriaceae species DETECTED (A) NOT DETECTED Final    Comment: Enterobacteriaceae represent a large family of gram-negative bacteria, not a single organism. CRITICAL RESULT CALLED TO, READ BACK BY AND VERIFIED WITH: M MACCIA,PHARMD AT 1239 07/09/17 BY L BENFIELD    Enterobacter cloacae complex NOT DETECTED NOT DETECTED Final   Escherichia  coli NOT DETECTED NOT DETECTED Final   Klebsiella oxytoca NOT DETECTED NOT DETECTED Final   Klebsiella pneumoniae NOT DETECTED NOT DETECTED Final   Proteus species NOT DETECTED NOT DETECTED Final   Serratia marcescens DETECTED (A) NOT DETECTED Final    Comment: CRITICAL RESULT CALLED TO, READ BACK BY AND VERIFIED WITH: M MACCIA,PHARMD AT 1239 07/09/17 BY L  BENFIELD    Carbapenem resistance NOT DETECTED NOT DETECTED Final   Haemophilus influenzae NOT DETECTED NOT DETECTED Final   Neisseria meningitidis NOT DETECTED NOT DETECTED Final   Pseudomonas aeruginosa NOT DETECTED NOT DETECTED Final   Candida albicans NOT DETECTED NOT DETECTED Final   Candida glabrata NOT DETECTED NOT DETECTED Final   Candida krusei NOT DETECTED NOT DETECTED Final   Candida parapsilosis NOT DETECTED NOT DETECTED Final   Candida tropicalis NOT DETECTED NOT DETECTED Final  Urine culture     Status: None   Collection Time: 07/07/17  5:15 PM  Result Value Ref Range Status   Specimen Description URINE, CLEAN CATCH  Final   Special Requests NONE  Final   Culture NO GROWTH  Final   Report Status 07/08/2017 FINAL  Final  Culture, blood (Routine X 2) w Reflex to ID Panel     Status: None (Preliminary result)   Collection Time: 07/10/17 10:33 AM  Result Value Ref Range Status   Specimen Description BLOOD RIGHT HAND  Final   Special Requests IN PEDIATRIC BOTTLE Blood Culture adequate volume  Final   Culture NO GROWTH 1 DAY  Final   Report Status PENDING  Incomplete  Culture, blood (Routine X 2) w Reflex to ID Panel     Status: None (Preliminary result)   Collection Time: 07/10/17 10:33 AM  Result Value Ref Range Status   Specimen Description BLOOD RIGHT HAND  Final   Special Requests IN PEDIATRIC BOTTLE Blood Culture adequate volume  Final   Culture NO GROWTH 1 DAY  Final   Report Status PENDING  Incomplete   Time coordinating discharge: 35 minutes  SIGNED:  Kerney Elbe, DO [facility] 07/11/2017,  6:47 PM Pager [phone]  If 7PM-7AM, please contact night-coverage [email or web address] Password [email or web address]

## 2017-07-11 NOTE — Care Management Note (Signed)
Case Management Note  Patient Details  Name: Gregory Price MRN: [id number] Date of Birth: May 23, 1951  Subjective/Objective:               Pt with  hx of  daily IV heroin use, Tobacco use, COPD, Hepatitis C who presents to the emergency Department with  Complaints of  persistent worsening back pain.      -  MRI revealedT5 compression fracture    Action/Plan: Plan is to d/c today. Friend will provide transportation to friend's place of residence. Pt is homeless. CSW provided pt with resources. Prescriptions written per hospitalist  faxed to the [facility] pharmacy. Pt instructed to pickup  Filled prescriptions from clinic @ d/c.  Expected Discharge Date:  07/11/17               Expected Discharge Plan:  Home/Self Care  In-House Referral:  Clinical Social Work (homeless)  Discharge planning Services  CM Consult  Status of Service:  Completed, signed off  If discussed at [employer], dates discussed:    Additional Comments:  Sharin Mons, RN 07/11/2017, 3:18 PM

## 2017-07-11 NOTE — Evaluation (Signed)
Physical Therapy Evaluation Patient Details Name: Gregory Price MRN: [id number] DOB: 09/24/1951 Today's Date: 07/11/2017   History of Present Illness  Gregory Price is a 66 y.o. male with medical history significant for daily IV heroin use, Tobacco use, COPD, Hepatitis C who presents to the emergency Department chief complaint persistent worsening back pain. Initial evaluation includes an MRI revealing T5 compression fracture as well as extensive edema throughout the T5 and T6 vertebral bodies and intervening disc signal is concerning for infectious discitis/osteomyelitis.  Clinical Impression  Pt admitted with/for back pain with potential infection and T5 compression fracture.  Pt needing min to supervision level for mobility at this point..  Pt currently limited functionally due to the problems listed. ( See problems list.)   Pt will benefit from PT to maximize function and safety in order to get ready for next venue listed below.     Follow Up Recommendations Other (comment) ([facility] placement)    Equipment Recommendations  Rolling walker with 5" wheels    Recommendations for Other Services       Precautions / Restrictions Precautions Precautions: None      Mobility  Bed Mobility Overal bed mobility: Needs Assistance Bed Mobility: Rolling;Sidelying to Sit Rolling: Supervision Sidelying to sit: Min assist       General bed mobility comments: Discussed best technique for getting to EOB with back fx and practiced.  Transfers Overall transfer level: Needs assistance   Transfers: Sit to/from Stand Sit to Stand: Supervision         General transfer comment: cues for hand placement when using the RW  Ambulation/Gait Ambulation/Gait assistance: Supervision Ambulation Distance (Feet): 250 Feet Assistive device: Rolling walker (2 wheeled) Gait Pattern/deviations: Step-through pattern   Gait velocity interpretation: at or above normal speed for age/gender General Gait Details:  steady gait with moderate speed.  Pt states that he has no more pain walking than laying or transitions.  Stairs            Wheelchair Mobility    Modified Rankin (Stroke Patients Only)       Balance Overall balance assessment: Needs assistance Sitting-balance support: No upper extremity supported Sitting balance-Leahy Scale: Good       Standing balance-Leahy Scale: Good                               Pertinent Vitals/Pain Pain Assessment: 0-10 Pain Score: 5  Pain Location: over T5 vertebra Pain Descriptors / Indicators: Sore;Aching Pain Intervention(s): Monitored during session    Home Living Family/patient expects to be discharged to:: Other (Comment) (pt is homeless) Living Arrangements: Other (Comment) (pt is homeless)               Additional Comments: Hoping to find some accomodation through [place] during recovery    Prior Function Level of Independence: Independent               Hand Dominance        Extremity/Trunk Assessment   Upper Extremity Assessment Upper Extremity Assessment: Overall WFL for tasks assessed    Lower Extremity Assessment Lower Extremity Assessment: Overall WFL for tasks assessed (minor weakness overall especially proximal mm.)       Communication   Communication: No difficulties  Cognition Arousal/Alertness: Awake/alert Behavior During Therapy: WFL for tasks assessed/performed Overall Cognitive Status: Within Functional Limits for tasks assessed  General Comments      Exercises     Assessment/Plan    PT Assessment Patient needs continued PT services  PT Problem List Decreased strength;Decreased activity tolerance;Decreased mobility;Pain;Decreased knowledge of use of DME       PT Treatment Interventions Gait training;DME instruction;Functional mobility training;Stair training;Therapeutic activities;Balance training;Patient/family education     PT Goals (Current goals can be found in the Care Plan section)  Acute Rehab PT Goals Patient Stated Goal: be in an environment where I can heal. PT Goal Formulation: With patient Time For Goal Achievement: 07/18/17 Potential to Achieve Goals: Good    Frequency Min 3X/week   Barriers to discharge        Co-evaluation               AM-PAC PT "6 Clicks" Daily Activity  Outcome Measure Difficulty turning over in bed (including adjusting bedclothes, sheets and blankets)?: A Little Difficulty moving from lying on back to sitting on the side of the bed? : A Lot Difficulty sitting down on and standing up from a chair with arms (e.g., wheelchair, bedside commode, etc,.)?: A Little Help needed moving to and from a bed to chair (including a wheelchair)?: A Little Help needed walking in hospital room?: A Little Help needed climbing 3-5 steps with a railing? : A Little 6 Click Score: 17    End of Session   Activity Tolerance: Patient tolerated treatment well Patient left: in bed;with call bell/phone within reach;with family/visitor present;Other (comment) (sitting EOB eating) Nurse Communication: Mobility status PT Visit Diagnosis: Difficulty in walking, not elsewhere classified (R26.2);Pain Pain - part of body:  (back)    Time: 5749-3552 PT Time Calculation (min) (ACUTE ONLY): 24 min   Charges:   PT Evaluation $PT Eval Moderate Complexity: 1 Mod PT Treatments $Gait Training: 8-22 mins   PT G Codes:        August 05, 2017  Donnella Sham, PT [phone]  (pager)  Tessie Fass Yaneliz Radebaugh August 05, 2017, 10:13 AM

## 2017-07-11 NOTE — Progress Notes (Signed)
Subjective: No new complaints   Antibiotics:  Anti-infectives    Start     Dose/Rate Route Frequency Ordered Stop   07/11/17 2000  ciprofloxacin (CIPRO) tablet 500 mg     500 mg Oral 2 times daily 07/11/17 1341 08/22/17 0759   07/11/17 1230  ciprofloxacin (CIPRO) tablet 750 mg  Status:  Discontinued     750 mg Oral 2 times daily 07/11/17 1122 07/11/17 1341   07/11/17 0000  ciprofloxacin (CIPRO) 500 MG tablet     500 mg Oral 2 times daily 07/11/17 1344     07/08/17 0400  vancomycin (VANCOCIN) IVPB 750 mg/150 ml premix  Status:  Discontinued     750 mg 150 mL/hr over 60 Minutes Intravenous Every 12 hours 07/07/17 1905 07/09/17 1325   07/07/17 1645  cefTRIAXone (ROCEPHIN) 2 g in dextrose 5 % 50 mL IVPB  Status:  Discontinued     2 g 100 mL/hr over 30 Minutes Intravenous Every 24 hours 07/07/17 1639 07/11/17 1122   07/07/17 1345  vancomycin (VANCOCIN) 1,500 mg in sodium chloride 0.9 % 500 mL IVPB  Status:  Discontinued     1,500 mg 250 mL/hr over 120 Minutes Intravenous  Once 07/07/17 1339 07/07/17 1513   07/07/17 1345  cefTRIAXone (ROCEPHIN) 2 g in dextrose 5 % 50 mL IVPB  Status:  Discontinued     2 g 100 mL/hr over 30 Minutes Intravenous  Once 07/07/17 1339 07/07/17 1519      Medications: Scheduled Meds: . ciprofloxacin  500 mg Oral BID  . FLUoxetine  20 mg Oral Daily  . folic acid  1 mg Oral Daily  . gabapentin  100 mg Oral QHS  . heparin  5,000 Units Subcutaneous Q8H  . hydrALAZINE  50 mg Oral Q8H  . LORazepam  0-4 mg Oral BID  . multivitamin with minerals  1 tablet Oral Daily  . nicotine  21 mg Transdermal Daily  . senna-docusate  1 tablet Oral BID  . thiamine  100 mg Oral Daily   Or  . thiamine  100 mg Intravenous Daily   Continuous Infusions:  PRN Meds:.acetaminophen **OR** acetaminophen, hydrALAZINE, HYDROcodone-acetaminophen, HYDROmorphone (DILAUDID) injection, ketorolac, ondansetron **OR** ondansetron (ZOFRAN) IV, polyethylene glycol, promethazine,  zolpidem    Objective: Weight change:   Intake/Output Summary (Last 24 hours) at 07/11/17 2027 Last data filed at 07/11/17 0536  Gross per 24 hour  Intake              100 ml  Output              400 ml  Net             -300 ml   Blood pressure (!) 97/49, pulse 63, temperature 98 F (36.7 C), temperature source Oral, resp. rate 18, height 5\' 6"  (1.676 m), weight 140 lb (63.5 kg), SpO2 98 %. Temp:  [98 F (36.7 C)-98.3 F (36.8 C)] 98 F (36.7 C) (10/02 1218) Pulse Rate:  [61-66] 63 (10/02 1218) Resp:  [18] 18 (10/02 1218) BP: (97-125)/(49-62) 97/49 (10/02 1218) SpO2:  [97 %-100 %] 98 % (10/02 1218)  Physical Exam: General: Alert and awake, oriented x3, not in any acute distress, dishivelled HEENT: anicteric sclera,  EOMI CVS regular rate, normal r,  no murmur rubs or gallops Chest: clear to auscultation bilaterally, no wheezing, rales or rhonchi Abdomen: soft nontender, nondistended, normal bowel sounds Skin: no new rashes Neuro: nonfocal  CBC:  CBC Latest Ref  Rng & Units 07/11/2017 07/10/2017 07/09/2017  WBC 4.0 - 10.5 K/uL 7.8 9.2 8.5  Hemoglobin 13.0 - 17.0 g/dL 13.0 13.3 13.2  Hematocrit 39.0 - 52.0 % 38.2(L) 39.8 40.0  Platelets 150 - 400 K/uL 329 344 327     BMET  Recent Labs  07/10/17 0404 07/11/17 0455  NA 138 136  K 3.8 3.9  CL 106 101  CO2 20* 23  GLUCOSE 88 98  BUN 15 18  CREATININE 0.81 0.82  CALCIUM 9.1 9.0     Liver Panel   Recent Labs  07/10/17 0404 07/11/17 0455  PROT 6.5 6.4*  ALBUMIN 3.4* 3.3*  AST 11* 17  ALT 10* 14*  ALKPHOS 71 80  BILITOT 1.2 1.1       Sedimentation Rate  Recent Labs  07/11/17 0455  ESRSEDRATE 33*   C-Reactive Protein  Recent Labs  07/11/17 0455  CRP 0.9    Micro Results: Recent Results (from the past 720 hour(s))  Blood culture (routine x 2)     Status: Abnormal   Collection Time: 07/07/17  2:40 PM  Result Value Ref Range Status   Specimen Description BLOOD RIGHT ARM  Final    Special Requests   Final    BOTTLES DRAWN AEROBIC AND ANAEROBIC Blood Culture adequate volume   Culture  Setup Time   Final    GRAM NEGATIVE RODS ANAEROBIC BOTTLE ONLY CRITICAL RESULT CALLED TO, READ BACK BY AND VERIFIED WITH: M MACCIA,PHARMD AT 1239 07/09/17 BY L BENFIELD    Culture SERRATIA MARCESCENS (A)  Final   Report Status 07/11/2017 FINAL  Final   Organism ID, Bacteria SERRATIA MARCESCENS  Final      Susceptibility   Serratia marcescens - MIC*    CEFAZOLIN >=64 RESISTANT Resistant     CEFEPIME <=1 SENSITIVE Sensitive     CEFTAZIDIME <=1 SENSITIVE Sensitive     CEFTRIAXONE <=1 SENSITIVE Sensitive     CIPROFLOXACIN <=0.25 SENSITIVE Sensitive     GENTAMICIN <=1 SENSITIVE Sensitive     TRIMETH/SULFA <=20 SENSITIVE Sensitive     * SERRATIA MARCESCENS  Blood culture (routine x 2)     Status: None (Preliminary result)   Collection Time: 07/07/17  2:40 PM  Result Value Ref Range Status   Specimen Description BLOOD RIGHT ANTECUBITAL  Final   Special Requests IN PEDIATRIC BOTTLE Blood Culture adequate volume  Final   Culture NO GROWTH 4 DAYS  Final   Report Status PENDING  Incomplete  Blood Culture ID Panel (Reflexed)     Status: Abnormal   Collection Time: 07/07/17  2:40 PM  Result Value Ref Range Status   Enterococcus species NOT DETECTED NOT DETECTED Final   Listeria monocytogenes NOT DETECTED NOT DETECTED Final   Staphylococcus species NOT DETECTED NOT DETECTED Final   Staphylococcus aureus NOT DETECTED NOT DETECTED Final   Streptococcus species NOT DETECTED NOT DETECTED Final   Streptococcus agalactiae NOT DETECTED NOT DETECTED Final   Streptococcus pneumoniae NOT DETECTED NOT DETECTED Final   Streptococcus pyogenes NOT DETECTED NOT DETECTED Final   Acinetobacter baumannii NOT DETECTED NOT DETECTED Final   Enterobacteriaceae species DETECTED (A) NOT DETECTED Final    Comment: Enterobacteriaceae represent a large family of gram-negative bacteria, not a single  organism. CRITICAL RESULT CALLED TO, READ BACK BY AND VERIFIED WITH: M MACCIA,PHARMD AT 1239 07/09/17 BY L BENFIELD    Enterobacter cloacae complex NOT DETECTED NOT DETECTED Final   Escherichia coli NOT DETECTED NOT DETECTED Final   Klebsiella oxytoca  NOT DETECTED NOT DETECTED Final   Klebsiella pneumoniae NOT DETECTED NOT DETECTED Final   Proteus species NOT DETECTED NOT DETECTED Final   Serratia marcescens DETECTED (A) NOT DETECTED Final    Comment: CRITICAL RESULT CALLED TO, READ BACK BY AND VERIFIED WITH: M MACCIA,PHARMD AT 1239 07/09/17 BY L BENFIELD    Carbapenem resistance NOT DETECTED NOT DETECTED Final   Haemophilus influenzae NOT DETECTED NOT DETECTED Final   Neisseria meningitidis NOT DETECTED NOT DETECTED Final   Pseudomonas aeruginosa NOT DETECTED NOT DETECTED Final   Candida albicans NOT DETECTED NOT DETECTED Final   Candida glabrata NOT DETECTED NOT DETECTED Final   Candida krusei NOT DETECTED NOT DETECTED Final   Candida parapsilosis NOT DETECTED NOT DETECTED Final   Candida tropicalis NOT DETECTED NOT DETECTED Final  Urine culture     Status: None   Collection Time: 07/07/17  5:15 PM  Result Value Ref Range Status   Specimen Description URINE, CLEAN CATCH  Final   Special Requests NONE  Final   Culture NO GROWTH  Final   Report Status 07/08/2017 FINAL  Final  Culture, blood (Routine X 2) w Reflex to ID Panel     Status: None (Preliminary result)   Collection Time: 07/10/17 10:33 AM  Result Value Ref Range Status   Specimen Description BLOOD RIGHT HAND  Final   Special Requests IN PEDIATRIC BOTTLE Blood Culture adequate volume  Final   Culture NO GROWTH 1 DAY  Final   Report Status PENDING  Incomplete  Culture, blood (Routine X 2) w Reflex to ID Panel     Status: None (Preliminary result)   Collection Time: 07/10/17 10:33 AM  Result Value Ref Range Status   Specimen Description BLOOD RIGHT HAND  Final   Special Requests IN PEDIATRIC BOTTLE Blood Culture adequate  volume  Final   Culture NO GROWTH 1 DAY  Final   Report Status PENDING  Incomplete    Studies/Results: No results found.    Assessment/Plan:  INTERVAL HISTORY: Serratia growing from culture that is S    Principal Problem:   Compression fracture of thoracic vertebra (HCC) Active Problems:   Substance induced mood disorder (HCC)   Hepatic steatosis   Tobacco abuse   History of hepatitis C   History of COPD   MDD (major depressive disorder), recurrent severe, without psychosis ([place])   IVDU (intravenous drug user)   Back pain   Lipoma   Hypokalemia   Bradycardia   Discitis thoracic region   Vertebral osteomyelitis (HCC)   Serratia marcescens infection ([place])   Gram-negative bacteremia   History of intravenous drug abuse    Gregory Price is a 66 y.o. male with  IVDU, treated HCV admitted with T5 diskitis and osteomyelitis of vertebra with Serratia growing from blood cultures.(by [facility])  Treat with 6 weeks of oral ciprofloxcin 500 mg po bid  Hx of HCV: HCV RNA ND so he has been cured. He claims not to share needles now  IVDU: grealy appreciate Dr. Richardean Canal help  We will arrange HSFU in next 4 weeks      LOS: 4 days   Alcide Evener 07/11/2017, 8:27 PM

## 2017-07-11 NOTE — Progress Notes (Signed)
Dr. Daryll Drown informed CM she would provide pt with GoodRx coupon to help assist with Suboxone  Rx cost.  Whitman Hero RN,BSN,CM

## 2017-07-11 NOTE — Progress Notes (Signed)
CSW received consult regarding homelessness. Patient reports that he has no where to stay. Patient's friend is at bedside. CSW provided homeless resources, including the [facility], which helps Veterans apply for the [employer] homeless program. Patient's friend stated she did not like the [facility] as she has been there before, but agrees that it has showers and other day to day things. CSW also provided bus passes. Patient has already been given substance use resources by another CSW.   CSW signing off.   Gregory Price LCSWA [phone]

## 2017-07-12 LAB — CULTURE, BLOOD (ROUTINE X 2)
CULTURE: NO GROWTH
Special Requests: ADEQUATE

## 2017-07-13 ENCOUNTER — Encounter: Payer: Self-pay | Admitting: Pharmacist

## 2017-07-13 NOTE — Progress Notes (Unsigned)
Patient was approved for suboxone patient assistance x 1 year

## 2017-07-15 LAB — CULTURE, BLOOD (ROUTINE X 2)
CULTURE: NO GROWTH
Culture: NO GROWTH
Special Requests: ADEQUATE
Special Requests: ADEQUATE

## 2017-08-09 ENCOUNTER — Inpatient Hospital Stay: Payer: Self-pay | Admitting: Infectious Disease

## 2017-08-13 ENCOUNTER — Inpatient Hospital Stay (HOSPITAL_COMMUNITY)
Admission: EM | Admit: 2017-08-13 | Discharge: 2017-08-20 | DRG: 095 | Disposition: A | Discharge status: Home / Self Care | Payer: Medicare Other | Attending: Internal Medicine | Admitting: Internal Medicine

## 2017-08-13 ENCOUNTER — Emergency Department (HOSPITAL_COMMUNITY): Payer: Medicare Other

## 2017-08-13 ENCOUNTER — Other Ambulatory Visit: Payer: Self-pay

## 2017-08-13 ENCOUNTER — Encounter (HOSPITAL_COMMUNITY): Payer: Self-pay

## 2017-08-13 DIAGNOSIS — E871 Hypo-osmolality and hyponatremia: Secondary | ICD-10-CM

## 2017-08-13 DIAGNOSIS — E876 Hypokalemia: Secondary | ICD-10-CM | POA: Diagnosis not present

## 2017-08-13 DIAGNOSIS — M4804 Spinal stenosis, thoracic region: Secondary | ICD-10-CM | POA: Diagnosis present

## 2017-08-13 DIAGNOSIS — G062 Extradural and subdural abscess, unspecified: Secondary | ICD-10-CM | POA: Diagnosis not present

## 2017-08-13 DIAGNOSIS — M4644 Discitis, unspecified, thoracic region: Secondary | ICD-10-CM | POA: Diagnosis present

## 2017-08-13 DIAGNOSIS — M4854XA Collapsed vertebra, not elsewhere classified, thoracic region, initial encounter for fracture: Secondary | ICD-10-CM | POA: Diagnosis present

## 2017-08-13 DIAGNOSIS — Z681 Body mass index (BMI) 19 or less, adult: Secondary | ICD-10-CM | POA: Diagnosis not present

## 2017-08-13 DIAGNOSIS — M4802 Spinal stenosis, cervical region: Secondary | ICD-10-CM | POA: Diagnosis present

## 2017-08-13 DIAGNOSIS — G8929 Other chronic pain: Secondary | ICD-10-CM | POA: Diagnosis present

## 2017-08-13 DIAGNOSIS — F0631 Mood disorder due to known physiological condition with depressive features: Secondary | ICD-10-CM | POA: Diagnosis not present

## 2017-08-13 DIAGNOSIS — Z9119 Patient's noncompliance with other medical treatment and regimen: Secondary | ICD-10-CM

## 2017-08-13 DIAGNOSIS — F17210 Nicotine dependence, cigarettes, uncomplicated: Secondary | ICD-10-CM | POA: Diagnosis present

## 2017-08-13 DIAGNOSIS — T368X6A Underdosing of other systemic antibiotics, initial encounter: Secondary | ICD-10-CM | POA: Diagnosis present

## 2017-08-13 DIAGNOSIS — G629 Polyneuropathy, unspecified: Secondary | ICD-10-CM | POA: Diagnosis not present

## 2017-08-13 DIAGNOSIS — B9689 Other specified bacterial agents as the cause of diseases classified elsewhere: Secondary | ICD-10-CM | POA: Diagnosis not present

## 2017-08-13 DIAGNOSIS — F1120 Opioid dependence, uncomplicated: Secondary | ICD-10-CM | POA: Diagnosis present

## 2017-08-13 DIAGNOSIS — R001 Bradycardia, unspecified: Secondary | ICD-10-CM | POA: Diagnosis present

## 2017-08-13 DIAGNOSIS — G061 Intraspinal abscess and granuloma: Secondary | ICD-10-CM | POA: Diagnosis not present

## 2017-08-13 DIAGNOSIS — F1420 Cocaine dependence, uncomplicated: Secondary | ICD-10-CM | POA: Diagnosis present

## 2017-08-13 DIAGNOSIS — Z85118 Personal history of other malignant neoplasm of bronchus and lung: Secondary | ICD-10-CM

## 2017-08-13 DIAGNOSIS — B1920 Unspecified viral hepatitis C without hepatic coma: Secondary | ICD-10-CM | POA: Diagnosis present

## 2017-08-13 DIAGNOSIS — F339 Major depressive disorder, recurrent, unspecified: Secondary | ICD-10-CM | POA: Diagnosis present

## 2017-08-13 DIAGNOSIS — Z9114 Patient's other noncompliance with medication regimen: Secondary | ICD-10-CM | POA: Diagnosis not present

## 2017-08-13 DIAGNOSIS — M4624 Osteomyelitis of vertebra, thoracic region: Secondary | ICD-10-CM | POA: Diagnosis present

## 2017-08-13 DIAGNOSIS — Z5181 Encounter for therapeutic drug level monitoring: Secondary | ICD-10-CM | POA: Diagnosis not present

## 2017-08-13 DIAGNOSIS — M40209 Unspecified kyphosis, site unspecified: Secondary | ICD-10-CM | POA: Diagnosis present

## 2017-08-13 DIAGNOSIS — F1920 Other psychoactive substance dependence, uncomplicated: Secondary | ICD-10-CM | POA: Diagnosis not present

## 2017-08-13 DIAGNOSIS — Z79899 Other long term (current) drug therapy: Secondary | ICD-10-CM

## 2017-08-13 DIAGNOSIS — M868X9 Other osteomyelitis, unspecified sites: Secondary | ICD-10-CM | POA: Diagnosis not present

## 2017-08-13 DIAGNOSIS — M546 Pain in thoracic spine: Secondary | ICD-10-CM | POA: Diagnosis present

## 2017-08-13 DIAGNOSIS — E875 Hyperkalemia: Secondary | ICD-10-CM | POA: Diagnosis present

## 2017-08-13 DIAGNOSIS — Z91030 Bee allergy status: Secondary | ICD-10-CM

## 2017-08-13 DIAGNOSIS — J449 Chronic obstructive pulmonary disease, unspecified: Secondary | ICD-10-CM | POA: Diagnosis present

## 2017-08-13 DIAGNOSIS — Z590 Homelessness: Secondary | ICD-10-CM

## 2017-08-13 DIAGNOSIS — R45851 Suicidal ideations: Secondary | ICD-10-CM | POA: Diagnosis present

## 2017-08-13 DIAGNOSIS — F1910 Other psychoactive substance abuse, uncomplicated: Secondary | ICD-10-CM | POA: Diagnosis not present

## 2017-08-13 DIAGNOSIS — I519 Heart disease, unspecified: Secondary | ICD-10-CM | POA: Diagnosis present

## 2017-08-13 DIAGNOSIS — I10 Essential (primary) hypertension: Secondary | ICD-10-CM | POA: Diagnosis present

## 2017-08-13 DIAGNOSIS — M549 Dorsalgia, unspecified: Secondary | ICD-10-CM | POA: Diagnosis present

## 2017-08-13 LAB — COMPREHENSIVE METABOLIC PANEL
ALBUMIN: 4 g/dL (ref 3.5–5.0)
ALT: 23 U/L (ref 17–63)
ANION GAP: 11 (ref 5–15)
AST: 49 U/L — AB (ref 15–41)
Alkaline Phosphatase: 89 U/L (ref 38–126)
BUN: 13 mg/dL (ref 6–20)
CHLORIDE: 95 mmol/L — AB (ref 101–111)
CO2: 26 mmol/L (ref 22–32)
Calcium: 9.2 mg/dL (ref 8.9–10.3)
Creatinine, Ser: 0.78 mg/dL (ref 0.61–1.24)
GFR calc Af Amer: >60 mL/min (ref >60)
GFR calc non Af Amer: >60 mL/min (ref >60)
GLUCOSE: 94 mg/dL (ref 65–99)
POTASSIUM: 5.3 mmol/L — AB (ref 3.5–5.1)
SODIUM: 132 mmol/L — AB (ref 135–145)
TOTAL PROTEIN: 7.7 g/dL (ref 6.5–8.1)
Total Bilirubin: 2.4 mg/dL — ABNORMAL HIGH (ref 0.3–1.2)

## 2017-08-13 LAB — CBC WITH DIFFERENTIAL/PLATELET
Basophils Absolute: 0 10*3/uL (ref 0.0–0.1)
Basophils Relative: 0 %
EOS PCT: 0 %
Eosinophils Absolute: 0 10*3/uL (ref 0.0–0.7)
HCT: 42.9 % (ref 39.0–52.0)
HEMOGLOBIN: 14.6 g/dL (ref 13.0–17.0)
LYMPHS ABS: 1.2 10*3/uL (ref 0.7–4.0)
LYMPHS PCT: 10 %
MCH: 28 pg (ref 26.0–34.0)
MCHC: 34 g/dL (ref 30.0–36.0)
MCV: 82.2 fL (ref 78.0–100.0)
Monocytes Absolute: 0.5 10*3/uL (ref 0.1–1.0)
Monocytes Relative: 4 %
NEUTROS PCT: 86 %
Neutro Abs: 10.6 10*3/uL — ABNORMAL HIGH (ref 1.7–7.7)
Platelets: 451 10*3/uL — ABNORMAL HIGH (ref 150–400)
RBC: 5.22 MIL/uL (ref 4.22–5.81)
RDW: 14.4 % (ref 11.5–15.5)
WBC: 12.4 10*3/uL — AB (ref 4.0–10.5)

## 2017-08-13 LAB — RAPID URINE DRUG SCREEN, HOSP PERFORMED
AMPHETAMINES: NOT DETECTED
BARBITURATES: NOT DETECTED
Benzodiazepines: NOT DETECTED
Cocaine: POSITIVE — AB
Opiates: POSITIVE — AB
TETRAHYDROCANNABINOL: NOT DETECTED

## 2017-08-13 LAB — I-STAT TROPONIN, ED: Troponin i, poc: 0 ng/mL (ref 0.00–0.08)

## 2017-08-13 LAB — URINALYSIS, ROUTINE W REFLEX MICROSCOPIC
BILIRUBIN URINE: NEGATIVE
GLUCOSE, UA: NEGATIVE mg/dL
HGB URINE DIPSTICK: NEGATIVE
Ketones, ur: NEGATIVE mg/dL
Leukocytes, UA: NEGATIVE
Nitrite: NEGATIVE
PH: 7 (ref 5.0–8.0)
Protein, ur: NEGATIVE mg/dL
SPECIFIC GRAVITY, URINE: 1.018 (ref 1.005–1.030)

## 2017-08-13 LAB — I-STAT CG4 LACTIC ACID, ED: LACTIC ACID, VENOUS: 1.82 mmol/L (ref 0.5–1.9)

## 2017-08-13 MED ORDER — GADOBENATE DIMEGLUMINE 529 MG/ML IV SOLN
13.0000 mL | Freq: Once | INTRAVENOUS | Status: AC
  Administered 2017-08-13: 13 mL via INTRAVENOUS

## 2017-08-13 MED ORDER — MORPHINE SULFATE (PF) 4 MG/ML IV SOLN
1.0000 mg | INTRAVENOUS | Status: DC | PRN
  Administered 2017-08-13 – 2017-08-14 (×4): 1 mg via INTRAVENOUS
  Filled 2017-08-13 (×5): qty 1

## 2017-08-13 MED ORDER — DEXTROSE 5 % IV SOLN
2.0000 g | Freq: Three times a day (TID) | INTRAVENOUS | Status: DC
  Administered 2017-08-14 (×2): 2 g via INTRAVENOUS
  Filled 2017-08-13 (×4): qty 2

## 2017-08-13 MED ORDER — HYDRALAZINE HCL 20 MG/ML IJ SOLN
10.0000 mg | Freq: Four times a day (QID) | INTRAMUSCULAR | Status: DC | PRN
  Administered 2017-08-14 – 2017-08-16 (×3): 10 mg via INTRAVENOUS
  Filled 2017-08-13 (×3): qty 1

## 2017-08-13 MED ORDER — ACETAMINOPHEN 325 MG PO TABS
650.0000 mg | ORAL_TABLET | Freq: Four times a day (QID) | ORAL | Status: DC | PRN
  Administered 2017-08-16: 650 mg via ORAL
  Filled 2017-08-13 (×2): qty 2

## 2017-08-13 MED ORDER — VITAMIN B-1 100 MG PO TABS
100.0000 mg | ORAL_TABLET | Freq: Every day | ORAL | Status: DC
  Administered 2017-08-13 – 2017-08-20 (×8): 100 mg via ORAL
  Filled 2017-08-13 (×8): qty 1

## 2017-08-13 MED ORDER — SODIUM CHLORIDE 0.9 % IV SOLN
INTRAVENOUS | Status: DC
  Administered 2017-08-13: 23:00:00 via INTRAVENOUS

## 2017-08-13 MED ORDER — FENTANYL CITRATE (PF) 100 MCG/2ML IJ SOLN
50.0000 ug | Freq: Once | INTRAMUSCULAR | Status: AC
  Administered 2017-08-13: 50 ug via INTRAVENOUS
  Filled 2017-08-13: qty 2

## 2017-08-13 MED ORDER — DEXTROSE 5 % IV SOLN
2.0000 g | Freq: Once | INTRAVENOUS | Status: AC
  Administered 2017-08-13: 2 g via INTRAVENOUS
  Filled 2017-08-13: qty 2

## 2017-08-13 MED ORDER — FOLIC ACID 1 MG PO TABS
1.0000 mg | ORAL_TABLET | Freq: Every day | ORAL | Status: DC
  Administered 2017-08-13 – 2017-08-20 (×8): 1 mg via ORAL
  Filled 2017-08-13 (×8): qty 1

## 2017-08-13 MED ORDER — ENOXAPARIN SODIUM 40 MG/0.4ML ~~LOC~~ SOLN
40.0000 mg | SUBCUTANEOUS | Status: DC
  Administered 2017-08-14 – 2017-08-18 (×5): 40 mg via SUBCUTANEOUS
  Filled 2017-08-13 (×7): qty 0.4

## 2017-08-13 MED ORDER — POLYETHYLENE GLYCOL 3350 17 G PO PACK: 17.0000 g | PACK | Freq: Every day | ORAL | Status: DC | PRN

## 2017-08-13 MED ORDER — SENNOSIDES-DOCUSATE SODIUM 8.6-50 MG PO TABS
1.0000 | ORAL_TABLET | Freq: Every day | ORAL | Status: DC
  Administered 2017-08-13 – 2017-08-19 (×7): 1 via ORAL
  Filled 2017-08-13 (×8): qty 1

## 2017-08-13 MED ORDER — ACETAMINOPHEN 650 MG RE SUPP: 650.0000 mg | Freq: Four times a day (QID) | RECTAL | Status: DC | PRN

## 2017-08-13 MED ORDER — SODIUM CHLORIDE 0.9 % IV BOLUS (SEPSIS)
1000.0000 mL | Freq: Once | INTRAVENOUS | Status: AC
  Administered 2017-08-13: 1000 mL via INTRAVENOUS

## 2017-08-13 MED ORDER — KETOROLAC TROMETHAMINE 30 MG/ML IJ SOLN
15.0000 mg | Freq: Once | INTRAMUSCULAR | Status: AC
  Administered 2017-08-13: 15 mg via INTRAVENOUS
  Filled 2017-08-13: qty 1

## 2017-08-13 MED ORDER — VANCOMYCIN HCL IN DEXTROSE 750-5 MG/150ML-% IV SOLN
750.0000 mg | Freq: Once | INTRAVENOUS | Status: AC
  Administered 2017-08-13: 750 mg via INTRAVENOUS
  Filled 2017-08-13: qty 150

## 2017-08-13 MED ORDER — NICOTINE 21 MG/24HR TD PT24
21.0000 mg | MEDICATED_PATCH | Freq: Every day | TRANSDERMAL | Status: DC
  Administered 2017-08-13 – 2017-08-14 (×2): 21 mg via TRANSDERMAL
  Filled 2017-08-13 (×8): qty 1

## 2017-08-13 MED ORDER — GABAPENTIN 100 MG PO CAPS
100.0000 mg | ORAL_CAPSULE | Freq: Every day | ORAL | Status: DC
  Administered 2017-08-13: 100 mg via ORAL
  Filled 2017-08-13: qty 1

## 2017-08-13 MED ORDER — KETOROLAC TROMETHAMINE 30 MG/ML IJ SOLN
30.0000 mg | Freq: Four times a day (QID) | INTRAMUSCULAR | Status: AC | PRN
  Administered 2017-08-13 – 2017-08-14 (×3): 30 mg via INTRAVENOUS
  Filled 2017-08-13 (×5): qty 1

## 2017-08-13 MED ORDER — FLUOXETINE HCL 20 MG PO CAPS
20.0000 mg | ORAL_CAPSULE | Freq: Every day | ORAL | Status: DC
  Administered 2017-08-14: 20 mg via ORAL
  Filled 2017-08-13: qty 1

## 2017-08-13 NOTE — ED Notes (Signed)
2nd set of blood cultures drawn and sent to lab.
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
## 2017-08-13 NOTE — ED Provider Notes (Signed)
[place] EMERGENCY DEPARTMENT Provider Note   CSN: [id number] Arrival date & time: 08/13/17  1232     History   Chief Complaint Chief Complaint  Patient presents with  . Back Pain    HPI Gregory Price is a 66 y.o. male.  HPI   Gregory Price is a 66 y.o. male, with a history of thoracic compression fracture, osteomyelitis/discitis, hepatitis C, and COPD, presenting to the ED with back pain since the beginning of Sept 2018.  Patient is a daily IV heroin user with last use yesterday.  Was admitted 9/28-10/2 for back pain with MRI showing evidence of compression fx and edema concerning for discitis/osteomyelitis. Was discharged on Cipro 500mg  BID for 6 weeks and told to follow up with ID in 4 weeks. Patient states he lost his ABX last week and his pain has been worsening since that time.   Current pain is midline thoracic spine, stabbing/burning, 10/10, nonradiating.   Denies fever/chills, N/V/D, abdominal pain, chest pain, SOB, falls/trauma, numbness, weakness, changes in bowel or bladder function, saddle anesthesias, or any other complaints.    Past Medical History:  Diagnosis Date  . Compression fracture of thoracic vertebra (HCC)   . COPD (chronic obstructive pulmonary disease) ([place])   . Hepatitis C   . Lung cancer ([place])    resolved in 2011    Patient Active Problem List   Diagnosis Date Noted  . Serratia marcescens infection ([place])   . Gram-negative bacteremia   . History of intravenous drug abuse   . Discitis thoracic region 07/08/2017  . Vertebral osteomyelitis ([place]) 07/08/2017  . Compression fracture of thoracic vertebra ([place]) 07/07/2017  . IVDU (intravenous drug user) 07/07/2017  . Back pain 07/07/2017  . Lipoma 07/07/2017  . Hypokalemia 07/07/2017  . Bradycardia 07/07/2017  . COPD (chronic obstructive pulmonary disease) ([place])   . Mid-back pain, acute   . Opioid type dependence, continuous ([place]) 10/19/2015  . MDD (major depressive  disorder), recurrent severe, without psychosis ([place]) 10/17/2015  . Opiate withdrawal ([place]) 08/09/2014  . Hepatic steatosis 08/09/2014  . Constipation due to opioid therapy 08/09/2014  . Hx of appendectomy 08/09/2014  . Bilateral renal cysts 08/09/2014  . Leukocytosis 08/09/2014  . Tobacco abuse 08/09/2014  . History of hepatitis C 08/09/2014  . History of COPD 08/09/2014  . Opioid withdrawal ([place]) 08/09/2014  . Elevated blood pressure 08/09/2014  . Substance induced mood disorder ([place]) 08/07/2014  . Suicidal ideation 08/07/2014    Past Surgical History:  Procedure Laterality Date  . ABDOMINAL SURGERY     2008  . APPENDECTOMY    . KNEE SURGERY     right knee, 1967  . LUNG SURGERY     2011       Home Medications    Prior to Admission medications   Medication Sig Start Date End Date Taking? Authorizing Provider  buprenorphine-naloxone (SUBOXONE) 8-2 mg SUBL SL tablet Place 1 tablet under the tongue 2 (two) times daily. 07/11/17   Sid Falcon, MD  ciprofloxacin (CIPRO) 500 MG tablet Take 1 tablet (500 mg total) by mouth 2 (two) times daily. 07/11/17   Sheikh, Omair Latif, DO  FLUoxetine (PROZAC) 20 MG capsule Take 1 capsule (20 mg total) by mouth daily. 07/12/17   Raiford Noble Latif, DO  folic acid (FOLVITE) 1 MG tablet Take 1 tablet (1 mg total) by mouth daily. 07/12/17   Raiford Noble Latif, DO  gabapentin (NEURONTIN) 100 MG capsule Take 1 capsule (100 mg total) by  mouth at bedtime. 07/11/17   Raiford Noble Latif, DO  ibuprofen (ADVIL,MOTRIN) 200 MG tablet Take 1 tablet (200 mg total) by mouth every 8 (eight) hours as needed (for pain). 07/11/17   Raiford Noble Latif, DO  Multiple Vitamin (MULTIVITAMIN WITH MINERALS) TABS tablet Take 1 tablet by mouth daily. 07/12/17   Sheikh, Omair Latif, DO  nicotine (NICODERM CQ - DOSED IN MG/24 HOURS) 21 mg/24hr patch Place 1 patch (21 mg total) onto the skin daily. 07/12/17   Sheikh, Georgina Quint Latif, DO  polyethylene glycol [facility] / GLYCOLAX)  packet Take 17 g by mouth daily as needed for mild constipation. 07/11/17   Sheikh, Omair Latif, DO  senna-docusate (SENOKOT-S) 8.6-50 MG tablet Take 1 tablet by mouth at bedtime. 07/11/17   Raiford Noble Latif, DO  thiamine 100 MG tablet Take 1 tablet (100 mg total) by mouth daily. 07/12/17   Kerney Elbe, DO    Family History Family History  Problem Relation Age of Onset  . Cancer Other     Social History Social History   Tobacco Use  . Smoking status: Current Every Day Smoker    Packs/day: 1.00    Years: 15.00    Pack years: 15.00    Types: Cigarettes  . Smokeless tobacco: Never Used  Substance Use Topics  . Alcohol use: No    Comment: former  . Drug use: Yes    Types: Heroin, IV    Comment: Last used Heroin 08-12-17      Allergies   Bee venom   Review of Systems Review of Systems  Constitutional: Negative for chills, diaphoresis and fever.  Respiratory: Negative for shortness of breath.   Cardiovascular: Negative for chest pain.  Gastrointestinal: Negative for abdominal pain, diarrhea, nausea and vomiting.  Genitourinary: Negative for difficulty urinating.  Musculoskeletal: Positive for back pain.  Neurological: Negative for weakness and numbness.  All other systems reviewed and are negative.    Physical Exam Updated Vital Signs BP (!) 175/75   Pulse 61   Temp 98.2 F (36.8 C) (Oral)   Resp 16   SpO2 100%   Physical Exam  Constitutional: He appears well-developed and well-nourished. No distress.  HENT:  Head: Normocephalic and atraumatic.  Eyes: Conjunctivae and EOM are normal. Pupils are equal, round, and reactive to light.  Neck: Normal range of motion. Neck supple.  Cardiovascular: Normal rate, regular rhythm, normal heart sounds and intact distal pulses.  Pulmonary/Chest: Effort normal and breath sounds normal. No respiratory distress.  Abdominal: Soft. There is no tenderness. There is no guarding.  Musculoskeletal: He exhibits tenderness.  He exhibits no edema.  Kyphotic appearance to patient's upper spine.  Tenderness in the area of midline T5 without noted swelling, erythema, or deformity.  Normal motor function intact in all extremities and spine. No other midline spinal tenderness.   Lymphadenopathy:    He has no cervical adenopathy.  Neurological: He is alert.  No sensory deficits.  No noted speech deficits. No aphasia. Patient handles oral secretions without difficulty. No noted swallowing defects.  Equal grip strength bilaterally. Strength 5/5 in the upper extremities. Strength 5/5 with flexion and extension of the hips, knees, and ankles bilaterally.  Patellar DTRs 2+ bilaterally Negative Romberg. No gait disturbance.  Coordination intact including heel to shin and finger to nose.  Cranial nerves III-XII grossly intact.  No facial droop.   Skin: Skin is warm and dry. Capillary refill takes less than 2 seconds. He is not diaphoretic.  Psychiatric: He has  a normal mood and affect. His behavior is normal.  Nursing note and vitals reviewed.    ED Treatments / Results  Labs (all labs ordered are listed, but only abnormal results are displayed) Labs Reviewed  COMPREHENSIVE METABOLIC PANEL - Abnormal; Notable for the following components:      Result Value   Sodium 132 (*)    Potassium 5.3 (*)    Chloride 95 (*)    AST 49 (*)    Total Bilirubin 2.4 (*)    All other components within normal limits  CBC WITH DIFFERENTIAL/PLATELET - Abnormal; Notable for the following components:   WBC 12.4 (*)    Platelets 451 (*)    Neutro Abs 10.6 (*)    All other components within normal limits  RAPID URINE DRUG SCREEN, HOSP PERFORMED - Abnormal; Notable for the following components:   Opiates POSITIVE (*)    Cocaine POSITIVE (*)    All other components within normal limits  CULTURE, BLOOD (ROUTINE X 2)  CULTURE, BLOOD (ROUTINE X 2)  URINALYSIS, ROUTINE W REFLEX MICROSCOPIC  I-STAT TROPONIN, ED  I-STAT CG4 LACTIC ACID,  ED  I-STAT CG4 LACTIC ACID, ED    EKG  EKG Interpretation None       Radiology No results found.  Procedures Procedures (including critical care time)  Medications Ordered in ED Medications  sodium chloride 0.9 % bolus 1,000 mL (1,000 mLs Intravenous New Bag/Given 08/13/17 1409)  ketorolac (TORADOL) 30 MG/ML injection 15 mg (15 mg Intravenous Given 08/13/17 1411)  gadobenate dimeglumine (MULTIHANCE) injection 13 mL (13 mLs Intravenous Contrast Given 08/13/17 1657)     Initial Impression / Assessment and Plan / ED Course  I have reviewed the triage vital signs and the nursing notes.  Pertinent labs & imaging results that were available during my care of the patient were reviewed by me and considered in my medical decision making (see chart for details).     Patient presents with worsening back pain.  No noted acute neurologic deficits.  Suspect possible worsening osteomyelitis, discitis, or development of abscess.  Patient is nontoxic appearing, afebrile, not tachycardic, not tachypneic, not hypotensive, maintains SPO2 of 100% on room air, and is in no apparent distress. Low suspicion for sepsis.   End of shift patient care handoff report given to [employer], PA-C. Plan: Admission following MRI and subsequent admission for IV ABX.   MRI results from 07/07/17: Moderate to severe T5 compression fracture deformity. Extensive edema throughout the T5 and T6 vertebral bodies and intervening disc signal is concerning for infectious discitis/osteomyelitis given the clinical history and surrounding paravertebral soft tissue edema. While there is slight wedging of the T6 vertebral body, purely posttraumatic edema without infection is felt less likely. No evidence of epidural abscess.  Blood cultures grew Serratia Marcescens   Final Clinical Impressions(s) / ED Diagnoses   Final diagnoses:  None    New Prescriptions This SmartLink is deprecated. Use AVSMEDLIST instead  to display the medication list for a patient.   Lorayne Bender, PA-C 08/13/17 1706    Tegeler, Gwenyth Allegra, MD 08/13/17 [phone]
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
## 2017-08-13 NOTE — ED Provider Notes (Signed)
She has signed out to me at shift change.  Patient with recent admission for osteomyelitis and discitis from presumed IV drug use.  Patient while in the hospital underwent MRI which showed T5 and T6 vertebral body compression fractures and extensive edema throughout area and disc.  He was treated with antibiotics while in the hospital, was treated with ceftriaxone and vancomycin.  His blood cultures did grow Serratia marcescens initially, which was sensitive to Cipro, and he was discharged with ciprofloxacin.  While in the hospital neurosurgery was consulted, who recommended consulting IR for possible aspiration.  IR declined procedure, felt that the level was too high to access safely at this time.  Patient was improving and was discharged home with close outpatient follow-up.  Upon discharge, patient states he stopped taking his Cipro up until 1 week ago because he "lost medications."  He also continues to use IV drug use.  He presented back to the ED with worsening back pain.  5:45 PM MRI today resulted with progressive collapse of T5 and T6 vertebrae's with associated disc osteomyelitis.  There is retropulsion of bone and disc material with that results in severe central canal stenosis at T5-T6 level.  There is also progressive paraspinal and epidural enhancing epidural abscess.  I examined patient.  Patient is reporting severe pain in the lower back, denies pain radiating down extremities.  Denies numbness or weakness to extremities.  Denies any trouble controlling bowels or bladder.  He denies any fever.  He states since the discharge he has been using IV drugs, last use several days ago.  Used cocaine yesterday.  Patient has midline and paraspinal tenderness over the entire lumbar and lower thoracic spine.  He has mild pain with range of motion bilateral hips.  He has normal strength with hip flexion, knee extension, plantar and dorsiflexion bilaterally.  Patellar reflexes 2+.  No saddle  anesthesia.  5:54 PM Spoke with Dr. Arnoldo Morale, advised at this time, admit to medicine, restart antibiotics, if need neurosurgery consult to call Dr. Cyndy Freeze in AM. At this time, no surgical intervention since neurovascularly intact.   Spoke with medicine, will admit.         Gregory Senior, Gregory Price 08/13/17 2356    Gregory Gambler, Gregory Price 08/14/17 [phone]

## 2017-08-13 NOTE — ED Triage Notes (Signed)
To room via EMS.  Pt has T5 Compression back fx and recent infection that he did not complete antibiotic course.  Back pain has worsened over the past 24 hours.  Pt took Ibu 200 mg x 7 @ 11am with no relief. EMS gave Fentanyl 100 mcg.  Pain decreased from 10/10 to 5/10.

## 2017-08-14 ENCOUNTER — Inpatient Hospital Stay (HOSPITAL_COMMUNITY): Payer: Medicare Other

## 2017-08-14 DIAGNOSIS — F1910 Other psychoactive substance abuse, uncomplicated: Secondary | ICD-10-CM

## 2017-08-14 DIAGNOSIS — F1120 Opioid dependence, uncomplicated: Secondary | ICD-10-CM

## 2017-08-14 DIAGNOSIS — Z590 Homelessness: Secondary | ICD-10-CM

## 2017-08-14 DIAGNOSIS — G062 Extradural and subdural abscess, unspecified: Secondary | ICD-10-CM

## 2017-08-14 DIAGNOSIS — R001 Bradycardia, unspecified: Secondary | ICD-10-CM

## 2017-08-14 DIAGNOSIS — Z9114 Patient's other noncompliance with medication regimen: Secondary | ICD-10-CM

## 2017-08-14 DIAGNOSIS — F0631 Mood disorder due to known physiological condition with depressive features: Secondary | ICD-10-CM

## 2017-08-14 DIAGNOSIS — F17210 Nicotine dependence, cigarettes, uncomplicated: Secondary | ICD-10-CM

## 2017-08-14 DIAGNOSIS — B9689 Other specified bacterial agents as the cause of diseases classified elsewhere: Secondary | ICD-10-CM

## 2017-08-14 DIAGNOSIS — M4644 Discitis, unspecified, thoracic region: Secondary | ICD-10-CM

## 2017-08-14 DIAGNOSIS — M868X9 Other osteomyelitis, unspecified sites: Secondary | ICD-10-CM

## 2017-08-14 DIAGNOSIS — F1920 Other psychoactive substance dependence, uncomplicated: Secondary | ICD-10-CM

## 2017-08-14 DIAGNOSIS — G629 Polyneuropathy, unspecified: Secondary | ICD-10-CM

## 2017-08-14 DIAGNOSIS — E871 Hypo-osmolality and hyponatremia: Secondary | ICD-10-CM

## 2017-08-14 LAB — ECHOCARDIOGRAM COMPLETE
AOASC: 37 cm
CHL CUP DOP CALC LVOT VTI: 13.8 cm
CHL CUP MV DEC (S): 236
EWDT: 236 ms
FS: 37 % (ref 28–44)
Height: 66 in
IV/PV OW: 0.8
LA diam end sys: 28 mm
LA vol: 36 mL
LADIAMINDEX: 1.74 cm/m2
LASIZE: 28 mm
LAVOLA4C: 41.2 mL
LAVOLIN: 22.4 mL/m2
LDCA: 3.8 cm2
LV PW d: 10 mm — AB (ref 0.6–1.1)
LV TDI E'LATERAL: 9.79
LV TDI E'MEDIAL: 5.33
LV e' LATERAL: 9.79 cm/s
LVOT SV: 52 mL
LVOT peak grad rest: 2 mmHg
LVOT peak vel: 78.3 cm/s
LVOTD: 22 mm
Lateral S' vel: 14.7 cm/s
MV pk E vel: 0.7 m/s
RV sys press: 31 mmHg
Reg peak vel: 265 cm/s
TAPSE: 22.8 mm
TR max vel: 265 cm/s
Weight: 1915.36 oz

## 2017-08-14 LAB — COMPREHENSIVE METABOLIC PANEL
ALBUMIN: 3.6 g/dL (ref 3.5–5.0)
ALK PHOS: 81 U/L (ref 38–126)
ALT: 14 U/L — AB (ref 17–63)
AST: 19 U/L (ref 15–41)
Anion gap: 16 — ABNORMAL HIGH (ref 5–15)
BILIRUBIN TOTAL: 1.5 mg/dL — AB (ref 0.3–1.2)
BUN: 12 mg/dL (ref 6–20)
CO2: 21 mmol/L — ABNORMAL LOW (ref 22–32)
CREATININE: 0.75 mg/dL (ref 0.61–1.24)
Calcium: 9.1 mg/dL (ref 8.9–10.3)
Chloride: 96 mmol/L — ABNORMAL LOW (ref 101–111)
GFR calc Af Amer: >60 mL/min (ref >60)
GFR calc non Af Amer: >60 mL/min (ref >60)
GLUCOSE: 109 mg/dL — AB (ref 65–99)
POTASSIUM: 3 mmol/L — AB (ref 3.5–5.1)
Sodium: 133 mmol/L — ABNORMAL LOW (ref 135–145)
TOTAL PROTEIN: 7.2 g/dL (ref 6.5–8.1)

## 2017-08-14 LAB — CBC
HEMATOCRIT: 40.4 % (ref 39.0–52.0)
Hemoglobin: 13.9 g/dL (ref 13.0–17.0)
MCH: 27.4 pg (ref 26.0–34.0)
MCHC: 34.4 g/dL (ref 30.0–36.0)
MCV: 79.5 fL (ref 78.0–100.0)
PLATELETS: 443 10*3/uL — AB (ref 150–400)
RBC: 5.08 MIL/uL (ref 4.22–5.81)
RDW: 14.3 % (ref 11.5–15.5)
WBC: 12.1 10*3/uL — ABNORMAL HIGH (ref 4.0–10.5)

## 2017-08-14 LAB — SEDIMENTATION RATE: Sed Rate: 37 mm/hr — ABNORMAL HIGH (ref 0–16)

## 2017-08-14 LAB — MRSA PCR SCREENING: MRSA by PCR: NEGATIVE

## 2017-08-14 MED ORDER — POTASSIUM CHLORIDE CRYS ER 20 MEQ PO TBCR
40.0000 meq | EXTENDED_RELEASE_TABLET | Freq: Two times a day (BID) | ORAL | Status: AC
  Administered 2017-08-14 – 2017-08-15 (×3): 40 meq via ORAL
  Filled 2017-08-14 (×3): qty 2

## 2017-08-14 MED ORDER — MORPHINE SULFATE (PF) 4 MG/ML IV SOLN
1.0000 mg | INTRAVENOUS | Status: DC | PRN
  Administered 2017-08-14 – 2017-08-16 (×7): 2 mg via INTRAVENOUS
  Administered 2017-08-16 (×2): 1 mg via INTRAVENOUS
  Filled 2017-08-14 (×8): qty 1

## 2017-08-14 MED ORDER — SODIUM CHLORIDE 0.9 % IV SOLN
INTRAVENOUS | Status: DC
  Administered 2017-08-14 – 2017-08-17 (×4): via INTRAVENOUS

## 2017-08-14 MED ORDER — FLUOXETINE HCL 20 MG PO CAPS
40.0000 mg | ORAL_CAPSULE | Freq: Every day | ORAL | Status: DC
  Administered 2017-08-15 – 2017-08-20 (×6): 40 mg via ORAL
  Filled 2017-08-14 (×6): qty 2

## 2017-08-14 MED ORDER — METOPROLOL TARTRATE 5 MG/5ML IV SOLN
5.0000 mg | INTRAVENOUS | Status: AC | PRN
  Administered 2017-08-14 (×2): 5 mg via INTRAVENOUS
  Filled 2017-08-14 (×2): qty 5

## 2017-08-14 MED ORDER — CIPROFLOXACIN HCL 500 MG PO TABS
750.0000 mg | ORAL_TABLET | Freq: Two times a day (BID) | ORAL | Status: DC
  Administered 2017-08-14 – 2017-08-20 (×13): 750 mg via ORAL
  Filled 2017-08-14 (×9): qty 1
  Filled 2017-08-14: qty 2
  Filled 2017-08-14 (×3): qty 1

## 2017-08-14 MED ORDER — GABAPENTIN 300 MG PO CAPS
300.0000 mg | ORAL_CAPSULE | Freq: Three times a day (TID) | ORAL | Status: DC
  Administered 2017-08-14 – 2017-08-20 (×18): 300 mg via ORAL
  Filled 2017-08-14 (×19): qty 1

## 2017-08-14 NOTE — Progress Notes (Signed)
Pts K was 3.0 this AM. Messaged attending MD

## 2017-08-14 NOTE — Progress Notes (Signed)
Imaging reviewed. Will order a Jewett hyperextension brace Patient is not a surgical candidate as long as he is medically non-compliant Will be by to see him later today

## 2017-08-14 NOTE — Progress Notes (Signed)
[facility]fusion Coordinator will follow pt with ID team to support home IV ABX at DC if ordered/needed.  If patient discharges after hours, please call [phone].   Gregory Price 08/14/2017, 9:50 AM
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
## 2017-08-14 NOTE — Progress Notes (Signed)
Orthopedic Tech Progress Note Patient Details:  Gregory Price [birth year] [id number]  Patient ID: [name], male   DOB: 29-Oct-1950, 66 y.o.   MRN: [id number]   [name] 08/14/2017, 9:12 AM Called in bio-tech brace order; spoke with [facility]

## 2017-08-14 NOTE — Progress Notes (Signed)
Patient arrived to floor in w/c accompanied by the staff member.  Patient oriented to the room, phone and call light, and also bed alarm was initiated as he was a high fall risk, verbalized understanding.  No Cardiac monitoring noted, IV on the Right AC noted.  Call bell within reach, will continue to monitoring.

## 2017-08-14 NOTE — Consult Note (Addendum)
CC:  Chief Complaint  Patient presents with  . Back Pain    HPI: Gregory Price is a 66 y.o. male with T5-6 spondylodiscitis who was treated with antibiotic therapy but was non-compliant.  He presents with worsening back pani and kyphosis.  PMH: Past Medical History:  Diagnosis Date  . Compression fracture of thoracic vertebra (HCC)   . COPD (chronic obstructive pulmonary disease) ([place])   . Hepatitis C   . Lung cancer ([place])    resolved in 2011    [place]: Past Surgical History:  Procedure Laterality Date  . ABDOMINAL SURGERY     2008  . APPENDECTOMY    . KNEE SURGERY     right knee, 1967  . LUNG SURGERY     2011    SH: Social History   Tobacco Use  . Smoking status: Current Every Day Smoker    Packs/day: 1.00    Years: 15.00    Pack years: 15.00    Types: Cigarettes  . Smokeless tobacco: Never Used  Substance Use Topics  . Alcohol use: No    Comment: former  . Drug use: Yes    Types: Heroin, IV    Comment: Last used Heroin 08-12-17     MEDS: Prior to Admission medications   Medication Sig Start Date End Date Taking? Authorizing Provider  buprenorphine-naloxone (SUBOXONE) 8-2 mg SUBL SL tablet Place 1 tablet under the tongue 2 (two) times daily. 07/11/17   Sid Falcon, MD  ciprofloxacin (CIPRO) 500 MG tablet Take 1 tablet (500 mg total) by mouth 2 (two) times daily. 07/11/17   Sheikh, Omair Latif, DO  FLUoxetine (PROZAC) 20 MG capsule Take 1 capsule (20 mg total) by mouth daily. 07/12/17   Raiford Noble Latif, DO  folic acid (FOLVITE) 1 MG tablet Take 1 tablet (1 mg total) by mouth daily. 07/12/17   Raiford Noble Latif, DO  gabapentin (NEURONTIN) 100 MG capsule Take 1 capsule (100 mg total) by mouth at bedtime. 07/11/17   Raiford Noble Latif, DO  ibuprofen (ADVIL,MOTRIN) 200 MG tablet Take 1 tablet (200 mg total) by mouth every 8 (eight) hours as needed (for pain). 07/11/17   Raiford Noble Latif, DO  Multiple Vitamin (MULTIVITAMIN WITH MINERALS) TABS tablet Take 1  tablet by mouth daily. 07/12/17   Sheikh, Omair Latif, DO  nicotine (NICODERM CQ - DOSED IN MG/24 HOURS) 21 mg/24hr patch Place 1 patch (21 mg total) onto the skin daily. 07/12/17   Sheikh, Georgina Quint Latif, DO  polyethylene glycol [facility] / GLYCOLAX) packet Take 17 g by mouth daily as needed for mild constipation. 07/11/17   Sheikh, Omair Latif, DO  senna-docusate (SENOKOT-S) 8.6-50 MG tablet Take 1 tablet by mouth at bedtime. 07/11/17   Raiford Noble Latif, DO  thiamine 100 MG tablet Take 1 tablet (100 mg total) by mouth daily. 07/12/17   Raiford Noble Latif, DO    ALLERGY: Allergies  Allergen Reactions  . Bee Venom Anaphylaxis    ROS: ROS  NEUROLOGIC EXAM: Awake, alert, oriented Memory and concentration grossly intact Speech fluent, appropriate CN grossly intact Motor exam: Upper Extremities Deltoid Bicep Tricep Grip  Right 5/5 5/5 5/5 5/5  Left 5/5 5/5 5/5 5/5   Lower Extremity IP Quad PF DF EHL  Right 5/5 5/5 5/5 5/5 5/5  Left 5/5 5/5 5/5 5/5 5/5   Sensation grossly intact to LT  IMAGING: MRI shows worsening height loss and kyphosis at T5-6.  There is retropulsed bone causing severe canal stenosis.  There  is a ventral epidural abscess at this level.   IMPRESSION: - 66 y.o. male with T5-6 osteomyelitis discitis who presents with worsening of his infection and fracture.  He has been non-compliant with therapy.  He is moving his legs well.  Sensation is normal.  PLAN: - He is not currently a candidate for surgery because of his medical non-compliance - Continue antibiotics - I have ordered a Jewett hyperextension brace which he should wear when he is out of bed

## 2017-08-14 NOTE — Progress Notes (Signed)
  Echocardiogram 2D Echocardiogram has been performed.  Gregory Price 08/14/2017, 4:02 PM

## 2017-08-14 NOTE — Progress Notes (Signed)
Called report to receiving RN on 2W. Pt to be transported by wheelchair.

## 2017-08-14 NOTE — Progress Notes (Signed)
Orthopedic Tech Progress Note Patient Details:  Gregory Price 1950/12/13 [id number] Brace completed by bio-tech. Patient ID: Gregory Price, male   DOB: 1951-05-12, 66 y.o.   MRN: [id number]   Gregory Price 08/14/2017, 4:02 PM

## 2017-08-14 NOTE — Progress Notes (Addendum)
Pt expressed thoughts to harm self, but not while here in the hospital during admission assessment. On-call NP notified. Will continue to monitor.

## 2017-08-14 NOTE — Progress Notes (Addendum)
[facility] TEAM 1 - Stepdown/ICU TEAM  Gregory Price  OAC:[id number] DOB: 10-Mar-1951 DOA: 08/13/2017 PCP: System, Provider Not In    Brief Narrative:  66yo male w/ nicotine dep, COPD, IV heroin use, Hepatitis C, and an admission on 9/28 for T5 compression fracture concerning for discitis/osteomyelitis.  Blood cultures 9/28 revealed Serratia marcens and ID recommended 6 weeks of abx.  He was discharged on cipro, but he had not been taking it.  He returned to the ED c/o increase in thoracic spine pain for 1 week.  In the ED an MRI noted progressive collapse of T5 and T6 associated disc osteomyelitis, retropulsed bone and disc material resulting in severe central canal stenosis at T5-6 with T2 signal abnormality in the cord compatible with edeme, severe foraminal stenosis bilaterally at C5-6, progressive paraspinal and epidural enhancing soft tissue compatible with epidural abscess ventral to the cord at the T5-6 level, and paraspinal and epidural enhancement extends up to the T4 level without definite enhancement in the T4 vertebral body or changes at the T4-5 disc.  UDS opioid positive, coccaine positive  Significant Events: 11/4 admit through ED  Subjective: Pt is resting quietly in his bed.  He is sleeping but awakens to my exam.  He asks me for higher doses of pain meds.  He denies cp, sob, abdom pain.    Assessment & Plan:  T5 Compression Fracture of Thoracic Spine in the setting of Discitis and Osteomyelitis - Epidural abscess ID suggests cipro 750 BID for 6 weeks - NS reports pt is not a candidate for surgical intervention - brace to be prescribed by NS to be used when out of bed   Hypokalemia Supplement and follow   IV Drug use  Resume suboxone as previously prescribed once pt more mobile and interactive - avoid escalating doses of other narcotics at this time   Hypertension BP poorly controlled, likely related to pain   COPD  Well compensated at this time   Hepatitis  C  Depression - Suicidal ideation  Psych has been consulted    DVT prophylaxis: lovenox  Code Status: FULL CODE Family Communication: no family present at time of exam  Disposition Plan: stable for med surg bed   Consultants:  NS ID Psych   Antimicrobials:  Cefepime Ceftriaxone Cipro Vanc   Objective: Blood pressure (!) 182/87, pulse 74, temperature 98.7 F (37.1 C), temperature source Oral, resp. rate (!) 29, height 5\' 6"  (1.676 m), weight 54.3 kg (119 lb 11.4 oz), SpO2 96 %.  Intake/Output Summary (Last 24 hours) at 08/14/2017 1428 Last data filed at 08/14/2017 1100 Gross per 24 hour  Intake 1200 ml  Output 400 ml  Net 800 ml   Filed Weights   08/14/17 0500  Weight: 54.3 kg (119 lb 11.4 oz)    Examination: General: No acute respiratory distress Lungs: Clear to auscultation bilaterally without wheezes or crackles Cardiovascular: Regular rate and rhythm without murmur gallop or rub normal S1 and S2 Abdomen: Nontender, nondistended, soft Extremities: No significant cyanosis, clubbing, or edema bilateral lower extremities  CBC: Recent Labs  Lab 08/13/17 1344 08/14/17 0521  WBC 12.4* 12.1*  NEUTROABS 10.6*  --   HGB 14.6 13.9  HCT 42.9 40.4  MCV 82.2 79.5  PLT 451* 010*   Basic Metabolic Panel: Recent Labs  Lab 08/13/17 1344 08/14/17 0521  NA 132* 133*  K 5.3* 3.0*  CL 95* 96*  CO2 26 21*  GLUCOSE 94 109*  BUN 13 12  CREATININE 0.78  0.75  CALCIUM 9.2 9.1   GFR: Estimated Creatinine Clearance: 70.7 mL/min (by C-G formula based on SCr of 0.75 mg/dL).  Liver Function Tests: Recent Labs  Lab 08/13/17 1344 08/14/17 0521  AST 49* 19  ALT 23 14*  ALKPHOS 89 81  BILITOT 2.4* 1.5*  PROT 7.7 7.2  ALBUMIN 4.0 3.6    Recent Results (from the past 240 hour(s))  Culture, blood (routine x 2)     Status: None (Preliminary result)   Collection Time: 08/13/17  2:03 PM  Result Value Ref Range Status   Specimen Description BLOOD RIGHT ANTECUBITAL   Final   Special Requests   Final    BOTTLES DRAWN AEROBIC AND ANAEROBIC Blood Culture results may not be optimal due to an inadequate volume of blood received in culture bottles   Culture NO GROWTH < 24 HOURS  Final   Report Status PENDING  Incomplete  Culture, blood (routine x 2)     Status: None (Preliminary result)   Collection Time: 08/13/17  8:59 PM  Result Value Ref Range Status   Specimen Description BLOOD LEFT UPPER ARM  Final   Special Requests   Final    BOTTLES DRAWN AEROBIC AND ANAEROBIC Blood Culture adequate volume   Culture NO GROWTH < 12 HOURS  Final   Report Status PENDING  Incomplete  MRSA PCR Screening     Status: None   Collection Time: 08/13/17 10:46 PM  Result Value Ref Range Status   MRSA by PCR NEGATIVE NEGATIVE Final    Comment:        The GeneXpert MRSA Assay (FDA approved for NASAL specimens only), is one component of a comprehensive MRSA colonization surveillance program. It is not intended to diagnose MRSA infection nor to guide or monitor treatment for MRSA infections.      Scheduled Meds: . ciprofloxacin  750 mg Oral BID  . enoxaparin (LOVENOX) injection  40 mg Subcutaneous Q24H  . FLUoxetine  20 mg Oral Daily  . folic acid  1 mg Oral Daily  . gabapentin  100 mg Oral QHS  . nicotine  21 mg Transdermal Daily  . senna-docusate  1 tablet Oral QHS  . thiamine  100 mg Oral Daily     LOS: 1 day   Cherene Altes, MD [facility] Office  [phone] Pager - Text Page per Amion as per below:  On-Call/Text Page:      [email or web address]      password [email or web address]  If 7PM-7AM, please contact night-coverage [email or web address] Password [email or web address] 08/14/2017, 2:28 PM

## 2017-08-14 NOTE — Consult Note (Signed)
[facility] Consult   Reason for Consult:  Depression and back pain Referring Physician:  Dr. Thereasa Solo Patient Identification: Gregory Price MRN:  [id number] Principal Diagnosis: Epidural abscess Diagnosis:   Patient Active Problem List   Diagnosis Date Noted  . 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 Hyponatremia [E87.1] 08/13/2017  . Serratia marcescens infection ([place]) [J15.6]   . Gram-negative bacteremia [R78.81]   . History of intravenous drug abuse [Z87.898]   . Discitis thoracic region [M46.44] 07/08/2017  . Vertebral osteomyelitis ([place]) [M46.20] 07/08/2017  . Compression fracture of thoracic vertebra ([place]) [S22.000A] 07/07/2017  . IVDU (intravenous drug user) [F19.90] 07/07/2017  . Back pain [M54.9] 07/07/2017  . Lipoma [D17.9] 07/07/2017  . Hypokalemia [E87.6] 07/07/2017  . Bradycardia [R00.1] 07/07/2017  . COPD (chronic obstructive pulmonary disease) ([place]) [J44.9]   . Mid-back pain, acute [M54.9]   . Opioid type dependence, continuous ([place]) [F11.20] 10/19/2015  . MDD (major depressive disorder), recurrent severe, without psychosis ([place]) [F33.2] 10/17/2015  . Opiate withdrawal ([place]) [F11.23] 08/09/2014  . Hepatic steatosis [K76.0] 08/09/2014  . Constipation due to opioid therapy [K59.03, T40.2X5A] 08/09/2014  . Hx of appendectomy [Z90.49] 08/09/2014  . Bilateral renal cysts [N28.1] 08/09/2014  . Leukocytosis [D72.829] 08/09/2014  . Tobacco abuse [Z72.0] 08/09/2014  . History of hepatitis C [Z86.19] 08/09/2014  . History of COPD [Z87.09] 08/09/2014  . Opioid withdrawal ([place]) [F11.23] 08/09/2014  . Elevated blood pressure [IMO0001] 08/09/2014  . Substance induced mood disorder ([place]) [F19.94] 08/07/2014  . Suicidal ideation [R45.851] 08/07/2014    Total Time spent with patient: 1 hour  Subjective:   Gregory Price is a 66 y.o. male patient admitted with osteomyelitis and pain.  HPI:  Gregory Price  is a 66 y.o. male seen face-to-face psychiatric  consultation and evaluation, for increasing symptoms of depression secondary to back pain associated with T5/6 discitis/osteomyelitis.  Patient reported he has been participating in outpatient medication management from [place] clinic.  Patient was previously treated at [facility].  Patient endorses increased symptoms of depression, anxiety and mostly associated with his back pain.  Patient also aware that he has been on antibiotic therapy and also recommended skilled nursing facility after completing his hospitalization.  Patient stated he is okay to go to the skilled nursing facility and is also agreed to make changes with this medication.  She reportedly lives with his friend and has no contact with the family members.  Patient denies current symptoms of suicidal ideation, intention or plans.  Patient has no evidence of auditory/visual hallucinations, delusions or paranoia.  Recent urine drug screens were positive for opiates and cocaine on arrival.  Patient has a history of nicotine dependence, COPD, IV heroin drug use, hepatitis C and reportedly noncompliant with outpatient medication management   Past Psychiatric History: MDD, recurrent and substance induced mood disorder. He was admitted to [facility] on 10/16/2015 for detox treatment.   Risk to Self: Is patient at risk for suicide?: No Risk to Others:   Prior Inpatient Therapy:   Prior Outpatient Therapy:    Past Medical History:  Past Medical History:  Diagnosis Date  . Compression fracture of thoracic vertebra (HCC)   . COPD (chronic obstructive pulmonary disease) ([place])   . Hepatitis C   . Lung cancer ([place])    resolved in 2011    Past Surgical History:  Procedure Laterality Date  . ABDOMINAL SURGERY     2008  . APPENDECTOMY    . KNEE SURGERY     right knee,  96  . LUNG SURGERY     2011   Family History:  Family History  Problem Relation Age of Onset  . Cancer Other    Family Psychiatric  History: unknown. Social History:   Social History   Substance and Sexual Activity  Alcohol Use No   Comment: former     Social History   Substance and Sexual Activity  Drug Use Yes  . Types: Heroin, IV   Comment: Last used Heroin 08-12-17     Social History   Socioeconomic History  . Marital status: Legally Separated    Spouse name: None  . Number of children: None  . Years of education: None  . Highest education level: None  Social Needs  . Financial resource strain: None  . Food insecurity - worry: None  . Food insecurity - inability: None  . Transportation needs - medical: None  . Transportation needs - non-medical: None  Occupational History  . None  Tobacco Use  . Smoking status: Current Every Day Smoker    Packs/day: 1.00    Years: 15.00    Pack years: 15.00    Types: Cigarettes  . Smokeless tobacco: Never Used  Substance and Sexual Activity  . Alcohol use: No    Comment: former  . Drug use: Yes    Types: Heroin, IV    Comment: Last used Heroin 08-12-17   . Sexual activity: None  Other Topics Concern  . None  Social History Narrative  . None   Additional Social History:    Allergies:   Allergies  Allergen Reactions  . Bee Venom Anaphylaxis    Labs:  Results for orders placed or performed during the hospital encounter of 08/13/17 (from the past 48 hour(s))  Comprehensive metabolic panel     Status: Abnormal   Collection Time: 08/13/17  1:44 PM  Result Value Ref Range   Sodium 132 (L) 135 - 145 mmol/L   Potassium 5.3 (H) 3.5 - 5.1 mmol/L    Comment: SLIGHT HEMOLYSIS   Chloride 95 (L) 101 - 111 mmol/L   CO2 26 22 - 32 mmol/L   Glucose, Bld 94 65 - 99 mg/dL   BUN 13 6 - 20 mg/dL   Creatinine, Ser 0.78 0.61 - 1.24 mg/dL   Calcium 9.2 8.9 - 10.3 mg/dL   Total Protein 7.7 6.5 - 8.1 g/dL   Albumin 4.0 3.5 - 5.0 g/dL   AST 49 (H) 15 - 41 U/L   ALT 23 17 - 63 U/L   Alkaline Phosphatase 89 38 - 126 U/L   Total Bilirubin 2.4 (H) 0.3 - 1.2 mg/dL   GFR calc non Af Amer >60 >60  mL/min   GFR calc Af Amer >60 >60 mL/min    Comment: (NOTE) The eGFR has been calculated using the CKD EPI equation. This calculation has not been validated in all clinical situations. eGFR's persistently <60 mL/min signify possible Chronic Kidney Disease.    Anion gap 11 5 - 15  CBC with Differential     Status: Abnormal   Collection Time: 08/13/17  1:44 PM  Result Value Ref Range   WBC 12.4 (H) 4.0 - 10.5 K/uL   RBC 5.22 4.22 - 5.81 MIL/uL   Hemoglobin 14.6 13.0 - 17.0 g/dL   HCT 42.9 39.0 - 52.0 %   MCV 82.2 78.0 - 100.0 fL   MCH 28.0 26.0 - 34.0 pg   MCHC 34.0 30.0 - 36.0 g/dL   RDW 14.4 11.5 -  15.5 %   Platelets 451 (H) 150 - 400 K/uL   Neutrophils Relative % 86 %   Neutro Abs 10.6 (H) 1.7 - 7.7 K/uL   Lymphocytes Relative 10 %   Lymphs Abs 1.2 0.7 - 4.0 K/uL   Monocytes Relative 4 %   Monocytes Absolute 0.5 0.1 - 1.0 K/uL   Eosinophils Relative 0 %   Eosinophils Absolute 0.0 0.0 - 0.7 K/uL   Basophils Relative 0 %   Basophils Absolute 0.0 0.0 - 0.1 K/uL  Culture, blood (routine x 2)     Status: None (Preliminary result)   Collection Time: 08/13/17  2:03 PM  Result Value Ref Range   Specimen Description BLOOD RIGHT ANTECUBITAL    Special Requests      BOTTLES DRAWN AEROBIC AND ANAEROBIC Blood Culture results may not be optimal due to an inadequate volume of blood received in culture bottles   Culture NO GROWTH < 24 HOURS    Report Status PENDING   I-stat troponin, ED     Status: None   Collection Time: 08/13/17  2:16 PM  Result Value Ref Range   Troponin i, poc 0.00 0.00 - 0.08 ng/mL   Comment 3            Comment: Due to the release kinetics of cTnI, a negative result within the first hours of the onset of symptoms does not rule out myocardial infarction with certainty. If myocardial infarction is still suspected, repeat the test at appropriate intervals.   I-Stat CG4 Lactic Acid, ED     Status: None   Collection Time: 08/13/17  2:19 PM  Result Value Ref  Range   Lactic Acid, Venous 1.82 0.5 - 1.9 mmol/L  Urinalysis, Routine w reflex microscopic     Status: None   Collection Time: 08/13/17  3:47 PM  Result Value Ref Range   Color, Urine YELLOW YELLOW   APPearance CLEAR CLEAR   Specific Gravity, Urine 1.018 1.005 - 1.030   pH 7.0 5.0 - 8.0   Glucose, UA NEGATIVE NEGATIVE mg/dL   Hgb urine dipstick NEGATIVE NEGATIVE   Bilirubin Urine NEGATIVE NEGATIVE   Ketones, ur NEGATIVE NEGATIVE mg/dL   Protein, ur NEGATIVE NEGATIVE mg/dL   Nitrite NEGATIVE NEGATIVE   Leukocytes, UA NEGATIVE NEGATIVE  Urine rapid drug screen (hosp performed)     Status: Abnormal   Collection Time: 08/13/17  3:48 PM  Result Value Ref Range   Opiates POSITIVE (A) NONE DETECTED   Cocaine POSITIVE (A) NONE DETECTED   Benzodiazepines NONE DETECTED NONE DETECTED   Amphetamines NONE DETECTED NONE DETECTED   Tetrahydrocannabinol NONE DETECTED NONE DETECTED   Barbiturates NONE DETECTED NONE DETECTED    Comment:        DRUG SCREEN FOR MEDICAL PURPOSES ONLY.  IF CONFIRMATION IS NEEDED FOR ANY PURPOSE, NOTIFY LAB WITHIN 5 DAYS.        LOWEST DETECTABLE LIMITS FOR URINE DRUG SCREEN Drug Class       Cutoff (ng/mL) Amphetamine      1000 Barbiturate      200 Benzodiazepine   809 Tricyclics       983 Opiates          300 Cocaine          300 THC              50   Culture, blood (routine x 2)     Status: None (Preliminary result)   Collection Time: 08/13/17  8:59 PM  Result Value Ref Range   Specimen Description BLOOD LEFT UPPER ARM    Special Requests      BOTTLES DRAWN AEROBIC AND ANAEROBIC Blood Culture adequate volume   Culture NO GROWTH < 12 HOURS    Report Status PENDING   MRSA PCR Screening     Status: None   Collection Time: 08/13/17 10:46 PM  Result Value Ref Range   MRSA by PCR NEGATIVE NEGATIVE    Comment:        The GeneXpert MRSA Assay (FDA approved for NASAL specimens only), is one component of a comprehensive MRSA colonization surveillance  program. It is not intended to diagnose MRSA infection nor to guide or monitor treatment for MRSA infections.   Sedimentation rate     Status: Abnormal   Collection Time: 08/14/17  5:21 AM  Result Value Ref Range   Sed Rate 37 (H) 0 - 16 mm/hr  Comprehensive metabolic panel     Status: Abnormal   Collection Time: 08/14/17  5:21 AM  Result Value Ref Range   Sodium 133 (L) 135 - 145 mmol/L   Potassium 3.0 (L) 3.5 - 5.1 mmol/L    Comment: NO VISIBLE HEMOLYSIS   Chloride 96 (L) 101 - 111 mmol/L   CO2 21 (L) 22 - 32 mmol/L   Glucose, Bld 109 (H) 65 - 99 mg/dL   BUN 12 6 - 20 mg/dL   Creatinine, Ser 0.75 0.61 - 1.24 mg/dL   Calcium 9.1 8.9 - 10.3 mg/dL   Total Protein 7.2 6.5 - 8.1 g/dL   Albumin 3.6 3.5 - 5.0 g/dL   AST 19 15 - 41 U/L   ALT 14 (L) 17 - 63 U/L   Alkaline Phosphatase 81 38 - 126 U/L   Total Bilirubin 1.5 (H) 0.3 - 1.2 mg/dL   GFR calc non Af Amer >60 >60 mL/min   GFR calc Af Amer >60 >60 mL/min    Comment: (NOTE) The eGFR has been calculated using the CKD EPI equation. This calculation has not been validated in all clinical situations. eGFR's persistently <60 mL/min signify possible Chronic Kidney Disease.    Anion gap 16 (H) 5 - 15  CBC     Status: Abnormal   Collection Time: 08/14/17  5:21 AM  Result Value Ref Range   WBC 12.1 (H) 4.0 - 10.5 K/uL   RBC 5.08 4.22 - 5.81 MIL/uL   Hemoglobin 13.9 13.0 - 17.0 g/dL   HCT 40.4 39.0 - 52.0 %   MCV 79.5 78.0 - 100.0 fL   MCH 27.4 26.0 - 34.0 pg   MCHC 34.4 30.0 - 36.0 g/dL   RDW 14.3 11.5 - 15.5 %   Platelets 443 (H) 150 - 400 K/uL    Current Facility-Administered Medications  Medication Dose Route Frequency Provider Last Rate Last Dose  . 0.9 %  sodium chloride infusion   Intravenous Continuous [name], MD 50 mL/hr at 08/13/17 2300    . acetaminophen (TYLENOL) tablet 650 mg  650 mg Oral Q6H PRN [name], MD       Or  . acetaminophen (TYLENOL) suppository 650 mg  650 mg Rectal Q6H PRN [name], MD       . ciprofloxacin (CIPRO) tablet 750 mg  750 mg Oral BID [place] Callas, NP      . enoxaparin (LOVENOX) injection 40 mg  40 mg Subcutaneous Q24H [name], MD      . FLUoxetine (PROZAC) capsule 20 mg  20 mg  Oral Daily [name], MD      . folic acid Darnelle Catalan) tablet 1 mg  1 mg Oral Daily [name], MD   1 mg at 08/13/17 2259  . gabapentin (NEURONTIN) capsule 100 mg  100 mg Oral QHS [name], MD   100 mg at 08/13/17 2259  . hydrALAZINE (APRESOLINE) injection 10 mg  10 mg Intravenous Q6H PRN [name], MD   10 mg at 08/14/17 0343  . ketorolac (TORADOL) 30 MG/ML injection 30 mg  30 mg Intravenous Q6H PRN Blount, Xenia T, NP   30 mg at 08/14/17 0530  . morphine 4 MG/ML injection 1 mg  1 mg Intravenous Q4H PRN [name], MD   1 mg at 08/14/17 0654  . nicotine (NICODERM CQ - dosed in mg/24 hours) patch 21 mg  21 mg Transdermal Daily [name], MD   21 mg at 08/13/17 2258  . polyethylene glycol (MIRALAX / GLYCOLAX) packet 17 g  17 g Oral Daily PRN [name], MD      . senna-docusate (Senokot-S) tablet 1 tablet  1 tablet Oral Loma Sousa, MD   1 tablet at 08/13/17 2258  . thiamine (VITAMIN B-1) tablet 100 mg  100 mg Oral Daily [name], MD   100 mg at 08/13/17 2258    Musculoskeletal: Strength & Muscle Tone: decreased Gait & Station: unable to stand Patient leans: N/A  Psychiatric Specialty Exam: Physical Exam as per history and physical  ROS complaining about severe back pain secondary to osteomyelitis of T 5-6. No Fever-chills, No Headache, No changes with Vision or hearing, reports vertigo No problems swallowing food or Liquids, No Chest pain, Cough or Shortness of Breath, No Abdominal pain, No Nausea or Vommitting, Bowel movements are regular, No Blood in stool or Urine, No dysuria, No new skin rashes or bruises, No new joints pains-aches,  No new weakness, tingling, numbness in any extremity, No recent weight gain or loss, No polyuria, polydypsia or polyphagia,  A  full 10 point Review of Systems was done, except as stated above, all other Review of Systems were negative.  Blood pressure (!) 167/94, pulse (!) 47, temperature 98.7 F (37.1 C), temperature source Oral, resp. rate (!) 28, height '5\' 6"'$  (1.676 m), weight 54.3 kg (119 lb 11.4 oz), SpO2 96 %.Body mass index is 19.32 kg/m.  General Appearance: Guarded  Eye Contact:  Fair  Speech:  Clear and Coherent and Slow  Volume:  Decreased  Mood:  Anxious and Depressed  Affect:  Constricted and Depressed  Thought Process:  Coherent and Goal Directed  Orientation:  Full (Time, Place, and Person)  Thought Content:  Rumination  Suicidal Thoughts:  No  Homicidal Thoughts:  No  Memory:  Immediate;   Good Recent;   Fair Remote;   Fair  Judgement:  Impaired  Insight:  Fair  Psychomotor Activity:  Decreased  Concentration:  Concentration: Fair and Attention Span: Fair  Recall:  Good  Fund of Knowledge:  Good  Language:  Good  Akathisia:  Negative  Handed:  Right  AIMS (if indicated):     Assets:  Communication Skills Desire for Improvement Financial Resources/Insurance Housing Leisure Time Resilience  ADL's:  Impaired  Cognition:  WNL  Sleep:        Treatment Plan Summary: 66 years old male veteran with the polysubstance abuse including IV heroin admitted with osteomyelitis of T5-6, severe chronic back pain and urine drug screen is positive for cocaine and opiates.  Patient endorses  depression and has been taking outpatient medication management which is not helping at this time and willing to titrate to higher dose.  Patient denies current suicidal/homicidal ideation, no evidence of psychotic symptoms.  Daily contact with patient to assess and evaluate symptoms and progress in treatment and Medication management  Recommendation: Increase his fluoxetine to 40 mg daily for depression  Titrate his gabapentin to 300 mg 3 times daily for neuropathic pain Appreciate psychiatric consultation and  we sign off as of today Please contact [phone] or [phone] if needs further assistance   Disposition: Patient benefit from skilled nursing facility placement when medically stable. Patient does not meet criteria for psychiatric inpatient admission. Supportive therapy provided about ongoing stressors.  Ambrose Finland, MD 08/14/2017 11:02 AM

## 2017-08-14 NOTE — Progress Notes (Signed)
I attempted to visit the patient 3 times  12:35. 2:25, 4:00 and patient was asleep.  If there is a recommended time for visit or good time to request, please page chaplain and will be happy to visit.  Conard Novak, Chaplain

## 2017-08-14 NOTE — Progress Notes (Signed)
Attempted to call report to receiving RN on 2W. Per reception, RN busy but will call back. Provided my name and number

## 2017-08-14 NOTE — Progress Notes (Addendum)
Pt expressed thoughts to harm self during admission assessment. On-call provider notified. Orders for psych consult placed. Removed pts phone and belongings from room. Will continue to monitor.

## 2017-08-15 DIAGNOSIS — Z5181 Encounter for therapeutic drug level monitoring: Secondary | ICD-10-CM

## 2017-08-15 LAB — COMPREHENSIVE METABOLIC PANEL
ALBUMIN: 3.3 g/dL — AB (ref 3.5–5.0)
ALT: 12 U/L — AB (ref 17–63)
AST: 14 U/L — AB (ref 15–41)
Alkaline Phosphatase: 77 U/L (ref 38–126)
Anion gap: 9 (ref 5–15)
BUN: 20 mg/dL (ref 6–20)
CHLORIDE: 105 mmol/L (ref 101–111)
CO2: 21 mmol/L — AB (ref 22–32)
CREATININE: 0.85 mg/dL (ref 0.61–1.24)
Calcium: 9 mg/dL (ref 8.9–10.3)
GFR calc Af Amer: >60 mL/min (ref >60)
GFR calc non Af Amer: >60 mL/min (ref >60)
Glucose, Bld: 112 mg/dL — ABNORMAL HIGH (ref 65–99)
Potassium: 3.5 mmol/L (ref 3.5–5.1)
SODIUM: 135 mmol/L (ref 135–145)
Total Bilirubin: 1.5 mg/dL — ABNORMAL HIGH (ref 0.3–1.2)
Total Protein: 6.4 g/dL — ABNORMAL LOW (ref 6.5–8.1)

## 2017-08-15 LAB — CBC
HCT: 38.3 % — ABNORMAL LOW (ref 39.0–52.0)
Hemoglobin: 13.1 g/dL (ref 13.0–17.0)
MCH: 27.2 pg (ref 26.0–34.0)
MCHC: 34.2 g/dL (ref 30.0–36.0)
MCV: 79.5 fL (ref 78.0–100.0)
PLATELETS: 412 10*3/uL — AB (ref 150–400)
RBC: 4.82 MIL/uL (ref 4.22–5.81)
RDW: 14.7 % (ref 11.5–15.5)
WBC: 11.3 10*3/uL — ABNORMAL HIGH (ref 4.0–10.5)

## 2017-08-15 LAB — MAGNESIUM: Magnesium: 1.6 mg/dL — ABNORMAL LOW (ref 1.7–2.4)

## 2017-08-15 MED ORDER — ENSURE ENLIVE PO LIQD
237.0000 mL | Freq: Two times a day (BID) | ORAL | Status: DC
  Administered 2017-08-15 – 2017-08-20 (×11): 237 mL via ORAL

## 2017-08-15 NOTE — Progress Notes (Signed)
Initial Nutrition Assessment  DOCUMENTATION CODES:   Severe malnutrition in context of social or environmental circumstances  INTERVENTION:   -Ensure Enlive BID; each supplement provides 350 kcals and 20 grams of protein  NUTRITION DIAGNOSIS:   Severe Malnutrition related to social / environmental circumstances(IV drug use, homelessness, severe back pain) as evidenced by percent weight loss, severe fat depletion, severe muscle depletion.  GOAL:   Patient will meet greater than or equal to 90% of their needs  MONITOR:   PO intake, Supplement acceptance, Labs, Weight trends  REASON FOR ASSESSMENT:   Malnutrition Screening Tool    ASSESSMENT:   Pt is a 66 year old male with persistant T5-6 osteomyelitis/epidural abscess. Has been off antibiotics for several weeks. PMH of COPD, lung cancer in 2011, hepatitis C, with a recent admission on 9/28 for a T5 compression fracture. Hx of IV heroin.  Pt reports that he has not been eating for the past week d/t his back pain. He has had only bites of food and sips of water and orange juice. Pt reports that his appetite and intake was "normal" before the past week. He states that he doesn't eat out very often but eats ready-to-eat foods or cooks frozen meals for himself. Per H&P note, pt is homeless so it is unclear how he cooks for himself.  Pt is on a regular diet but has ate 0% of his meals. Pt is open to drinking Ensure. Will order BID.  Pt reports that he has lost weight but is unsure of the amount or time frame. Says his UBW is 150 lbs. Per weight records in chart, pt weighed 119 lbs on 11/5 and 140 lbs on 9/28. This indicates a 15% weight loss in a little over one month, severe for time frame.   Medications- Lovenox, Folvite, Vitamin B1  Labs- Glucose 112 (H), Magnesium 1.6 (L)  NUTRITION - FOCUSED PHYSICAL EXAM:    Most Recent Value  Orbital Region  No depletion  Upper Arm Region  Severe depletion  Thoracic and Lumbar Region   Moderate depletion  Buccal Region  Mild depletion  Temple Region  Mild depletion  Clavicle Bone Region  Moderate depletion  Scapular Bone Region  Moderate depletion  Dorsal Hand  Mild depletion  Patellar Region  Severe depletion  Anterior Thigh Region  Severe depletion  Posterior Calf Region  Moderate depletion  Edema (RD Assessment)  None  Hair  Reviewed  Eyes  Reviewed  Mouth  Reviewed  Skin  Reviewed  Nails  Reviewed     Physical exam completed but difficult d/t pts severe pain causing body to be twisted in bed.  Diet Order:  Diet regular Room service appropriate? Yes; Fluid consistency: Thin  EDUCATION NEEDS:   No education needs have been identified at this time  Skin:  Skin Assessment: Reviewed RN Assessment  Last BM:  11/5  Height:   Ht Readings from Last 1 Encounters:  08/13/17 5\' 6"  (1.676 m)    Weight:   Wt Readings from Last 1 Encounters:  08/14/17 119 lb 11.4 oz (54.3 kg)    Ideal Body Weight:  64.5 kg  BMI:  Body mass index is 19.32 kg/m.  Estimated Nutritional Needs:   Kcal:  1700-1900 kcals (27-30 kcal/kg IBW)  Protein:  85-95 grams protein  Fluid:  1.7-1.9 [place] Dietetic Intern Pager: [phone] 08/15/2017 12:13 PM

## 2017-08-15 NOTE — Progress Notes (Signed)
PROGRESS NOTE    Gregory Price  OYD:[id number] DOB: [birth year] DOA: 08/13/2017 PCP: System, Provider Not In   Chief Complaint  Patient presents with  . Back Pain    Brief Narrative:  66yo male w/ nicotine dep, COPD, IV heroin use, Hepatitis C, and an admission on 9/28 for T5 compression fracture concerning for discitis/osteomyelitis.  Blood cultures 9/28 revealed Serratia marcens and ID recommended 6 weeks of abx.  He was discharged on cipro, but he had not been taking it.  He returned to the ED c/o increase in thoracic spine pain for 1 week.  In the ED an MRI noted progressive collapse of T5 and T6 associated disc osteomyelitis, retropulsed bone and disc material resulting in severe central canal stenosis at T5-6 with T2 signal abnormality in the cord compatible with edeme, severe foraminal stenosis bilaterally at C5-6, progressive paraspinal and epidural enhancing soft tissue compatible with epidural abscess ventral to the cord at the T5-6 level, and paraspinal and epidural enhancement extends up to the T4 level without definite enhancement in the T4 vertebral body or changes at the T4-5 disc.  UDS opioid positive, coccaine positive Assessment & Plan   T5 Compression Fracture of Thoracic Spine in the setting of Discitis and Osteomyelitis - Epidural abscess -infectious disease consulted and appreciated, recommended cipro 750 BID for 6 weeks -neurosurgery consulted and appreciated, patient is not a surgical candidate due to noncompliance. Recommended Jewett hyperextension brace which should be worn when patient is out of bed. -PT/OT consulted and appreciated -Continue Toradol, morphine as needed for pain  Hypokalemia -Resolved with replacement, continue to monitor BMP  IV Drug use/polysubstance abuse -Resume suboxone as previously prescribed once pt more mobile and interactive  -avoid escalating doses of other narcotics at this time  -Drug screen positive for opiates and  cocaine  Hypertension -BP poorly controlled, likely related to pain  -Not on any home medications, continue hydralazine as needed  COPD  -currently compensated, no wheezing   Hepatitis C -continue outpatient follow up  Depression/Suicidal ideation  -Psychiatry consulted and appreciated, did not feel patient was suicidal and is not a candidate for inpatient psychiatry  -recommended: fluoxetine 40mg  daily for depression, gabapentin 300mg  TID for neuropathic pain  DVT Prophylaxis  Lovenox  Code Status: Full  Family Communication: No family at bedside  Disposition Plan: Admitted, pending PT/OT eval  Consultants Neurosurgery Infectious disease Psychiatry   Procedures  Echocardiogram  Antibiotics   Anti-infectives (From admission, onward)   Start     Dose/Rate Route Frequency Ordered Stop   08/14/17 1100  ciprofloxacin (CIPRO) tablet 750 mg     750 mg Oral 2 times daily 08/14/17 0954     08/13/17 2200  ceFEPIme (MAXIPIME) 2 g in dextrose 5 % 50 mL IVPB  Status:  Discontinued     2 g 100 mL/hr over 30 Minutes Intravenous Every 8 hours 08/13/17 2101 08/14/17 0954   08/13/17 1745  cefTRIAXone (ROCEPHIN) 2 g in dextrose 5 % 50 mL IVPB     2 g 100 mL/hr over 30 Minutes Intravenous  Once 08/13/17 1732 08/13/17 2117   08/13/17 1745  vancomycin (VANCOCIN) IVPB 750 mg/150 ml premix     750 mg 150 mL/hr over 60 Minutes Intravenous  Once 08/13/17 1732 08/13/17 2216      Subjective:   Gregory Price seen and examined today.  Continues to complain of back pain and inability to move. States he can't eat because he cannot sit up due to pain. Feels he's  not getting enough pain medication. Denies chest pain, shortness breath, abdominal pain, nausea vomiting, diarrhea or constipation.  Objective:   Vitals:   08/14/17 1838 08/14/17 2226 08/15/17 0539 08/15/17 0900  BP: (!) 155/82 (!) 155/94 (!) 150/95 (!) 177/81  Pulse:  89 81 72  Resp:  20 20 19   Temp:  99.5 F (37.5 C) 99.3  F (37.4 C) 98.9 F (37.2 C)  TempSrc: Oral Oral Oral Oral  SpO2:  99% 97% 98%  Weight:      Height:        Intake/Output Summary (Last 24 hours) at 08/15/2017 1340 Last data filed at 08/15/2017 1100 Gross per 24 hour  Intake 1438.75 ml  Output 275 ml  Net 1163.75 ml   Filed Weights   08/14/17 0500  Weight: 54.3 kg (119 lb 11.4 oz)    Exam  General: Well developed, well nourished, NAD, appears stated age  47: NCAT, mucous membranes moist.   Cardiovascular: S1 S2 auscultated, RRR  Respiratory: Clear to auscultation bilaterally with equal chest rise  Abdomen: Soft, nontender, nondistended, + bowel sounds  Extremities: warm dry without cyanosis clubbing or edema  Neuro: AAOx3, nonfocal  Psych: Flat   Data Reviewed: I have personally reviewed following labs and imaging studies  CBC: Recent Labs  Lab 08/13/17 1344 08/14/17 0521 08/15/17 0404  WBC 12.4* 12.1* 11.3*  NEUTROABS 10.6*  --   --   HGB 14.6 13.9 13.1  HCT 42.9 40.4 38.3*  MCV 82.2 79.5 79.5  PLT 451* 443* 361*   Basic Metabolic Panel: Recent Labs  Lab 08/13/17 1344 08/14/17 0521 08/15/17 0404  NA 132* 133* 135  K 5.3* 3.0* 3.5  CL 95* 96* 105  CO2 26 21* 21*  GLUCOSE 94 109* 112*  BUN 13 12 20   CREATININE 0.78 0.75 0.85  CALCIUM 9.2 9.1 9.0  MG  --   --  1.6*   GFR: Estimated Creatinine Clearance: 66.5 mL/min (by C-G formula based on SCr of 0.85 mg/dL). Liver Function Tests: Recent Labs  Lab 08/13/17 1344 08/14/17 0521 08/15/17 0404  AST 49* 19 14*  ALT 23 14* 12*  ALKPHOS 89 81 77  BILITOT 2.4* 1.5* 1.5*  PROT 7.7 7.2 6.4*  ALBUMIN 4.0 3.6 3.3*   No results for input(s): LIPASE, AMYLASE in the last 168 hours. No results for input(s): AMMONIA in the last 168 hours. Coagulation Profile: No results for input(s): INR, PROTIME in the last 168 hours. Cardiac Enzymes: No results for input(s): CKTOTAL, CKMB, CKMBINDEX, TROPONINI in the last 168 hours. BNP (last 3 results) No  results for input(s): PROBNP in the last 8760 hours. HbA1C: No results for input(s): HGBA1C in the last 72 hours. CBG: No results for input(s): GLUCAP in the last 168 hours. Lipid Profile: No results for input(s): CHOL, HDL, LDLCALC, TRIG, CHOLHDL, LDLDIRECT in the last 72 hours. Thyroid Function Tests: No results for input(s): TSH, T4TOTAL, FREET4, T3FREE, THYROIDAB in the last 72 hours. Anemia Panel: No results for input(s): VITAMINB12, FOLATE, FERRITIN, TIBC, IRON, RETICCTPCT in the last 72 hours. Urine analysis:    Component Value Date/Time   COLORURINE YELLOW 08/13/2017 [place] 08/13/2017 1547   LABSPEC 1.018 08/13/2017 1547   PHURINE 7.0 08/13/2017 1547   GLUCOSEU NEGATIVE 08/13/2017 1547   HGBUR NEGATIVE 08/13/2017 1547   BILIRUBINUR NEGATIVE 08/13/2017 1547   KETONESUR NEGATIVE 08/13/2017 1547   PROTEINUR NEGATIVE 08/13/2017 1547   UROBILINOGEN 1.0 08/09/2014 1519   NITRITE NEGATIVE 08/13/2017 1547  LEUKOCYTESUR NEGATIVE 08/13/2017 1547   Sepsis Labs: @LABRCNTIP (procalcitonin:4,lacticidven:4)  ) Recent Results (from the past 240 hour(s))  Culture, blood (routine x 2)     Status: None (Preliminary result)   Collection Time: 08/13/17  2:03 PM  Result Value Ref Range Status   Specimen Description BLOOD RIGHT ANTECUBITAL  Final   Special Requests   Final    BOTTLES DRAWN AEROBIC AND ANAEROBIC Blood Culture results may not be optimal due to an inadequate volume of blood received in culture bottles   Culture NO GROWTH 2 DAYS  Final   Report Status PENDING  Incomplete  Culture, blood (routine x 2)     Status: None (Preliminary result)   Collection Time: 08/13/17  8:59 PM  Result Value Ref Range Status   Specimen Description BLOOD LEFT UPPER ARM  Final   Special Requests   Final    BOTTLES DRAWN AEROBIC AND ANAEROBIC Blood Culture adequate volume   Culture NO GROWTH 2 DAYS  Final   Report Status PENDING  Incomplete  MRSA PCR Screening     Status:  None   Collection Time: 08/13/17 10:46 PM  Result Value Ref Range Status   MRSA by PCR NEGATIVE NEGATIVE Final    Comment:        The GeneXpert MRSA Assay (FDA approved for NASAL specimens only), is one component of a comprehensive MRSA colonization surveillance program. It is not intended to diagnose MRSA infection nor to guide or monitor treatment for MRSA infections.       Radiology Studies: Mr Thoracic Spine W Wo Contrast  Result Date: 08/13/2017 CLINICAL DATA:  Back pain, infection suspected. No disc osteomyelitis with progressive pain. EXAM: MRI THORACIC WITHOUT AND WITH CONTRAST TECHNIQUE: Multiplanar and multiecho pulse sequences of the thoracic spine were obtained without and with intravenous contrast. CONTRAST:  <See Chart> MULTIHANCE GADOBENATE DIMEGLUMINE 529 MG/ML IV SOLN COMPARISON:  MRI of the thoracic spine 07/07/2017. FINDINGS: MRI THORACIC SPINE FINDINGS Alignment: Further exaggerated kyphosis is present following near complete collapse of the T5 vertebral body. Vertebrae: There is further collapse of the T5 vertebral body in the superior endplate T6. Significant retropulsed bone extends 10 mm into the spinal canal at T5-6. Cord: Focal T2 hyperintensity is present within the spinal cord at the T5-6 level. Paraspinal and other soft tissues: Progressive enhancing soft tissue is present within the paraspinal space at T5 and T6. Enhancing soft tissue extends to the inferior aspect of T4 without abnormal signal or enhancement in the T4-5 disc space. A paraspinal lipoma is present from the C7 through T2 on the left. Disc levels: The disc levels at L3-4 above are normal. T4-5:  Negative. T5-6: Severe central canal stenosis is present with distortion of the cord. Severe foraminal stenosis is now present bilaterally. No fluid or fat can be seen within either foramen. Enhancing epidural tissue extends superiorly to T4 and inferiorly to the T6-7 disc level. T6-7:  Negative. No significant  disc protrusion or stenosis is present within the lower thoracic spine. IMPRESSION: 1. 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 2. Retropulsed bone and disc material results in severe central canal stenosis at T5-6 with T2 signal abnormality in the cord compatible with edema. 3. Severe foraminal stenosis bilaterally at C5-6. 4. Progressive paraspinal and epidural enhancing soft tissue compatible with epidural abscess ventral to the cord at the T5-6 level. 5. Paraspinal and epidural enhancement extends up to the T4 level without definite enhancement in the T4 vertebral body or changes  at the T4-5 disc. These results were called by telephone at the time of interpretation on 08/13/2017 at 5:17 pm to Dr. Regenia Skeeter, who verbally acknowledged these results. Electronically Signed   By: San Morelle M.D.   On: 08/13/2017 17:19     Scheduled Meds: . ciprofloxacin  750 mg Oral BID  . enoxaparin (LOVENOX) injection  40 mg Subcutaneous Q24H  . feeding supplement (ENSURE ENLIVE)  237 mL Oral BID BM  . FLUoxetine  40 mg Oral Daily  . folic acid  1 mg Oral Daily  . gabapentin  300 mg Oral TID  . nicotine  21 mg Transdermal Daily  . potassium chloride  40 mEq Oral BID  . senna-docusate  1 tablet Oral QHS  . thiamine  100 mg Oral Daily   Continuous Infusions: . sodium chloride 75 mL/hr at 08/14/17 1506     LOS: 2 days   Time Spent in minutes   30 minutes  Gregory Price D.O. on 08/15/2017 at 1:40 PM  Between 7am to 7pm - Pager - [phone]  After 7pm go to [email or web address] - password [email or web address]  And look for the night coverage person covering for me after hours  [facility] Office  [phone]

## 2017-08-15 NOTE — Progress Notes (Signed)
PT Cancellation Note  Patient Details Name: Mecca Barga MRN: [id number] DOB: 11-05-1950   Cancelled Treatment:    Reason Eval/Treat Not Completed: Pain limiting ability to participate. Attempted to see patient x2. Pt reported he needed pain medicine because he was in 10/10 pain. PT returned 1 hour later s/p pain medicine given and pt reports "I'm fine while i'm laying still but any movement i'm in excruciating pain. They aren't giving me enough pain medicine." Pt educated on the importance of mobility and offered to educate on different transfer techniques to minimize pain. Pt declined and reports "I can't even eat because I can't sit up." RN notified. Acute PT to return as able to complete eval.   Keshawn Fiorito M Stesha Neyens 08/15/2017, 12:50 PM   Kittie Plater, PT, DPT Pager #: [phone] Office #: [phone]
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
## 2017-08-16 DIAGNOSIS — M546 Pain in thoracic spine: Secondary | ICD-10-CM

## 2017-08-16 DIAGNOSIS — J449 Chronic obstructive pulmonary disease, unspecified: Secondary | ICD-10-CM

## 2017-08-16 LAB — CBC
HCT: 37.4 % — ABNORMAL LOW (ref 39.0–52.0)
Hemoglobin: 12.6 g/dL — ABNORMAL LOW (ref 13.0–17.0)
MCH: 27.2 pg (ref 26.0–34.0)
MCHC: 33.7 g/dL (ref 30.0–36.0)
MCV: 80.8 fL (ref 78.0–100.0)
PLATELETS: 408 10*3/uL — AB (ref 150–400)
RBC: 4.63 MIL/uL (ref 4.22–5.81)
RDW: 14.8 % (ref 11.5–15.5)
WBC: 10.2 10*3/uL (ref 4.0–10.5)

## 2017-08-16 LAB — BASIC METABOLIC PANEL
Anion gap: 7 (ref 5–15)
BUN: 25 mg/dL — AB (ref 6–20)
CO2: 23 mmol/L (ref 22–32)
CREATININE: 0.76 mg/dL (ref 0.61–1.24)
Calcium: 8.8 mg/dL — ABNORMAL LOW (ref 8.9–10.3)
Chloride: 107 mmol/L (ref 101–111)
GFR calc Af Amer: >60 mL/min (ref >60)
GLUCOSE: 102 mg/dL — AB (ref 65–99)
POTASSIUM: 4.2 mmol/L (ref 3.5–5.1)
Sodium: 137 mmol/L (ref 135–145)

## 2017-08-16 MED ORDER — HYDROMORPHONE HCL 1 MG/ML IJ SOLN
1.0000 mg | INTRAMUSCULAR | Status: DC | PRN
  Administered 2017-08-16 – 2017-08-17 (×4): 1 mg via INTRAVENOUS
  Administered 2017-08-17: 2 mg via INTRAVENOUS
  Filled 2017-08-16 (×2): qty 1
  Filled 2017-08-16: qty 2
  Filled 2017-08-16 (×2): qty 1
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
## 2017-08-16 NOTE — Clinical Social Work Note (Signed)
Clinical Social Work Assessment  Patient Details  Name: Gregory Price MRN: [id number] Date of Birth: 12-26-1950  Date of referral:  08/16/17               Reason for consult:  Facility Placement                Permission sought to share information with:  Facility Art therapist granted to share information::  Yes, Verbal Permission Granted  Name::        Agency::  [facility]  Relationship::     Contact Information:     Housing/Transportation Living arrangements for the past 2 months:  Homeless Source of Information:  Patient Patient Interpreter Needed:  None Criminal Activity/Legal Involvement Pertinent to Current Situation/Hospitalization:  No - Comment as needed Significant Relationships:  Friend Lives with:  Self Do you feel safe going back to the place where you live?  No Need for family participation in patient care:  No (Coment)  Care giving concerns:  CSW received consult for possible SNF placement at time of discharge. CSW spoke with patient regarding recommendation of SNF placement at time of discharge. Patient reported that he is currently living on the streets and is in a lot of pain. Patient expressed understanding of PT recommendation and is agreeable to SNF placement at time of discharge. CSW to continue to follow and assist with discharge planning needs.   Social Worker assessment / plan:  CSW spoke with patient concerning possibility of rehab at [facility] before returning home.  Employment status:  Retired Forensic scientist:  Medicare PT Recommendations:  Not assessed at this time [place] / Referral to community resources:  [place]  Patient/Family's Response to care:  Patient recognizes need for rehab before returning home and is agreeable to a SNF. He states that sometimes his friend allows him to stay at his place. CSW explained potential barriers with IV drug use such as need for out of county placement options. Patient in  agreement.   Patient/Family's Understanding of and Emotional Response to Diagnosis, Current Treatment, and Prognosis:  Patient/family is realistic regarding therapy needs and expressed being hopeful for SNF placement. Patient expressed understanding of CSW role and discharge process as well as medical condition. No questions/concerns about plan or treatment.    Emotional Assessment Appearance:  Appears stated age Attitude/Demeanor/Rapport:  Other, Guarded(Appropriate but guarded) Affect (typically observed):  Accepting, Appropriate, Guarded Orientation:  Oriented to Self, Oriented to Situation, Oriented to Place, Oriented to  Time Alcohol / Substance use:  Illicit Drugs Psych involvement (Current and /or in the community):  No (Comment)  Discharge Needs  Concerns to be addressed:  Care Coordination, Homelessness, Substance Abuse Concerns Readmission within the last 30 days:  No Current discharge risk:  Homeless Barriers to Discharge:  Continued Medical Work up   [employer], [place] 08/16/2017, 5:02 PM

## 2017-08-16 NOTE — Evaluation (Signed)
Occupational Therapy Evaluation Patient Details Name: Gregory Price MRN: [id number] DOB: 02-Sep-1951 Today's Date: 08/16/2017    History of Present Illness Pt is a 66 y/o male admitted secondary to worsening back pain. Pt found to have an epidural abscess, T5-T6 osteomyelitis and T5-T6 spondylodiscitis. PMH including but not limited to polysubstance abuse, COPD and Hep C.   Clinical Impression   PTA Pt independent in ADL and mobility. Pt is currently very limited by pain and is min A for ADL and declined transfers at this time (although modified independent in bed mobility). Please see OT problem list below. Pt will benefit from OT in the acute setting as well as afterwards at the SNF level to continue education in back precautions/safety as well as return to PLOF. Next session to bring back handout and focus on activity tolerance for ADLin addition to don/doff of brace.    Follow Up Recommendations  SNF    Equipment Recommendations  Other (comment)(defer to next venue)    Recommendations for Other Services       Precautions / Restrictions Precautions Precautions: Fall;Back Precaution Comments: pt very focused on pain and not able to educate on back precautions or log roll technique at this time; pt's position of comfort is flexed position Required Braces or Orthoses: Spinal Brace Spinal Brace: Other (comment)(Jewett hyperextension brace) Spinal Brace Comments: donned spinal brace and adjusted with pt sitting EOB; however, he was unable to tolerate, reporting it did not help decrease pain and requested brace to be removed Restrictions Weight Bearing Restrictions: No      Mobility Bed Mobility Overal bed mobility: Modified Independent             General bed mobility comments: increased time and effort, limited secondary to pain  Transfers                 General transfer comment: NT at this time as pt with increased pain and declined attempt to perform transfers  this session    Balance Overall balance assessment: Needs assistance Sitting-balance support: Feet supported Sitting balance-Leahy Scale: Fair Sitting balance - Comments: pt able to sit without UE supports, however, preferring to assume tripod position as this was his position of comfort Postural control: Other (comment)(anterior lean)                                 ADL either performed or assessed with clinical judgement   ADL Overall ADL's : Needs assistance/impaired Eating/Feeding: Minimal assistance;Sitting(EOB) Eating/Feeding Details (indicate cue type and reason): Pt sitting tolerance is very limited by pain, assisted with cutting up of food to decreased time required to be in sitting for pain management Grooming: Set up;Sitting   Upper Body Bathing: Moderate assistance   Lower Body Bathing: Minimal assistance   Upper Body Dressing : Moderate assistance;Sitting Upper Body Dressing Details (indicate cue type and reason): to don/doff brace Lower Body Dressing: Minimal assistance   Toilet Transfer: Min guard   Toileting- Clothing Manipulation and Hygiene: Moderate assistance       Functional mobility during ADLs: (not assessed at this time) General ADL Comments: pain greatly limits Pt's ability to perform ADL at this time. OT attempted to educated Pt on back precautions and proper biomechanics at this time - questions retention     Vision Patient Visual Report: No change from baseline       Perception     Praxis  Pertinent Vitals/Pain Pain Assessment: Faces Pain Score: 10-Worst pain ever(Pt self-reports 10/10) Faces Pain Scale: Hurts even more Pain Location: back Pain Descriptors / Indicators: Sore;Guarding Pain Intervention(s): Monitored during session;Repositioned;Patient requesting pain meds-RN notified;RN gave pain meds during session     Hand Dominance     Extremity/Trunk Assessment Upper Extremity Assessment Upper Extremity  Assessment: Overall WFL for tasks assessed;Generalized weakness   Lower Extremity Assessment Lower Extremity Assessment: Defer to PT evaluation   Cervical / Trunk Assessment Cervical / Trunk Assessment: Kyphotic;Other exceptions Cervical / Trunk Exceptions: chronic back pain with compression fxs   Communication Communication Communication: No difficulties   Cognition Arousal/Alertness: Awake/alert Behavior During Therapy: Restless Overall Cognitive Status: Impaired/Different from baseline Area of Impairment: Memory;Safety/judgement;Problem solving                     Memory: Decreased recall of precautions   Safety/Judgement: Decreased awareness of deficits   Problem Solving: Requires verbal cues General Comments: Prior to therapy entering room, Pt was on his knees, in the bed eating breakfast, as soon as therapy entered, he rolled down into fetal position and throughout session preferred flexed position for comfort   General Comments       Exercises     Shoulder Instructions      Home Living Family/patient expects to be discharged to:: Shelter/Homeless                                 Additional Comments: Hopeful pt will go to SNF for further rehab      Prior Functioning/Environment Level of Independence: Independent                 OT Problem List: Decreased activity tolerance;Impaired balance (sitting and/or standing);Decreased safety awareness;Decreased knowledge of use of DME or AE;Decreased knowledge of precautions;Pain      OT Treatment/Interventions: Self-care/ADL training;Neuromuscular education;DME and/or AE instruction;Therapeutic activities;Patient/family education;Balance training    OT Goals(Current goals can be found in the care plan section) Acute Rehab OT Goals Patient Stated Goal: decrease pain OT Goal Formulation: With patient Time For Goal Achievement: 08/30/17 Potential to Achieve Goals: Fair ADL Goals Pt Will  Perform Lower Body Bathing: with modified independence;sit to/from stand Pt Will Perform Lower Body Dressing: with modified independence;sit to/from stand Pt Will Transfer to Toilet: with modified independence;stand pivot transfer;bedside commode Pt Will Perform Toileting - Clothing Manipulation and hygiene: with modified independence;sit to/from stand Additional ADL Goal #1: Pt will recall 3/3 back precautions (no BLT) to assist with pain management, activity tolerance during ADL with no more than 1 verbal cue  OT Frequency: Min 2X/week   Barriers to D/C: Inaccessible home environment;Decreased caregiver support  Pt is homeless       Co-evaluation PT/OT/SLP Co-Evaluation/Treatment: Yes Reason for Co-Treatment: For patient/therapist safety;To address functional/ADL transfers;Other (comment)(activity tolerance) PT goals addressed during session: Mobility/safety with mobility;Balance OT goals addressed during session: ADL's and self-care      AM-PAC PT "6 Clicks" Daily Activity     Outcome Measure Help from another person eating meals?: None Help from another person taking care of personal grooming?: A Little Help from another person toileting, which includes using toliet, bedpan, or urinal?: A Little Help from another person bathing (including washing, rinsing, drying)?: A Little Help from another person to put on and taking off regular upper body clothing?: A Lot Help from another person to put on and taking off regular lower  body clothing?: A Little 6 Click Score: 18   End of Session Equipment Utilized During Treatment: Back brace Nurse Communication: Mobility status;Precautions  Activity Tolerance: Patient limited by pain Patient left: in bed;with call bell/phone within reach;with bed alarm set;with nursing/sitter in room  OT Visit Diagnosis: Unsteadiness on feet (R26.81);Other abnormalities of gait and mobility (R26.89);Muscle weakness (generalized) (M62.81);Pain;Adult, failure  to thrive (R62.7) Pain - Right/Left: (central) Pain - part of body: (back)                Time: 5038-8828 OT Time Calculation (min): 23 min Charges:  OT General Charges $OT Visit: 1 Visit OT Evaluation $OT Eval Moderate Complexity: 1 Mod G-Codes:     Hulda Humphrey OTR/L [place] 08/16/2017, 9:44 AM

## 2017-08-16 NOTE — Evaluation (Signed)
Physical Therapy Evaluation Patient Details Name: Gregory Price MRN: [id number] DOB: [birth year] Today's Date: 08/16/2017   History of Present Illness  Pt is a 66 y/o male admitted secondary to worsening back pain. Pt found to have an epidural abscess, T5-T6 osteomyelitis and T5-T6 spondylodiscitis. PMH including but not limited to polysubstance abuse, COPD and Hep C.  Clinical Impression  Upon arrival pt sitting upright in bed eating his breakfast. After therapist introduced self, pt immediately reporting pain and assumed a flexed trunk position. He then laid himself back down in the bed reporting that he was in too much pain to eat. Prior to admission, pt was homeless and independent with all functional mobility and ADLs. Pt currently very limited secondary to back pain. Pt performed bed mobility with increased time and effort, no physical assistance or supervision needed. Donned hyperextension brace with pt sitting EOB and adjusted appropriately; however, pt reporting no change in pain and requesting removal of brace.   Pt would continue to benefit from skilled physical therapy services at this time while admitted and after d/c to address the below listed limitations in order to improve overall safety and independence with functional mobility.     Follow Up Recommendations SNF;Supervision/Assistance - 24 hour    Equipment Recommendations  None recommended by PT;Other (comment)(TBD based on mobility progression)    Recommendations for Other Services       Precautions / Restrictions Precautions Precautions: Fall;Back Precaution Comments: pt very focused on pain and not able to educate on back precautions or log roll technique at this time; pt's position of comfort is flexed position Required Braces or Orthoses: Spinal Brace Spinal Brace: Other (comment)(Jewett hyperextension brace) Spinal Brace Comments: donned spinal brace and adjusted with pt sitting EOB; however, he was unable to  tolerate, reporting it did not help decrease pain and requested brace to be removed Restrictions Weight Bearing Restrictions: No      Mobility  Bed Mobility Overal bed mobility: Modified Independent             General bed mobility comments: increased time and effort, limited secondary to pain  Transfers                 General transfer comment: NT at this time as pt with increased pain and not willing to perform transfers this session  Ambulation/Gait                Stairs            Wheelchair Mobility    Modified Rankin (Stroke Patients Only)       Balance Overall balance assessment: Needs assistance Sitting-balance support: Feet supported Sitting balance-Leahy Scale: Fair Sitting balance - Comments: pt able to sit without UE supports, however, preferring to assume tripod position as this was his position of comfort Postural control: Other (comment)(anterior lean)                                   Pertinent Vitals/Pain Pain Assessment: Faces Faces Pain Scale: Hurts even more Pain Location: back Pain Descriptors / Indicators: Sore;Guarding Pain Intervention(s): Monitored during session;Repositioned;Patient requesting pain meds-RN notified;RN gave pain meds during session    Home Living Family/patient expects to be discharged to:: Shelter/Homeless                 Additional Comments: Hopeful pt will go to SNF for further rehab    Prior Function Level  of Independence: Independent               Hand Dominance        Extremity/Trunk Assessment   Upper Extremity Assessment Upper Extremity Assessment: Defer to OT evaluation    Lower Extremity Assessment Lower Extremity Assessment: Generalized weakness    Cervical / Trunk Assessment Cervical / Trunk Assessment: Kyphotic;Other exceptions Cervical / Trunk Exceptions: chronic back pain with compression fxs  Communication   Communication: No difficulties   Cognition Arousal/Alertness: Awake/alert Behavior During Therapy: Restless Overall Cognitive Status: Impaired/Different from baseline Area of Impairment: Memory;Safety/judgement;Problem solving                     Memory: Decreased recall of precautions   Safety/Judgement: Decreased awareness of deficits   Problem Solving: Requires verbal cues        General Comments      Exercises     Assessment/Plan    PT Assessment Patient needs continued PT services  PT Problem List Decreased activity tolerance;Decreased balance;Decreased mobility;Decreased coordination;Decreased safety awareness;Decreased knowledge of precautions;Pain       PT Treatment Interventions DME instruction;Gait training;Stair training;Functional mobility training;Therapeutic activities;Therapeutic exercise;Balance training;Neuromuscular re-education;Patient/family education    PT Goals (Current goals can be found in the Care Plan section)  Acute Rehab PT Goals Patient Stated Goal: decrease pain PT Goal Formulation: With patient Time For Goal Achievement: 08/30/17 Potential to Achieve Goals: Good    Frequency Min 2X/week   Barriers to discharge Decreased caregiver support;Other (comment)(homeless)      Co-evaluation PT/OT/SLP Co-Evaluation/Treatment: Yes Reason for Co-Treatment: To address functional/ADL transfers;Other (comment)(activity tolerance) PT goals addressed during session: Mobility/safety with mobility;Balance         AM-PAC PT "6 Clicks" Daily Activity  Outcome Measure Difficulty turning over in bed (including adjusting bedclothes, sheets and blankets)?: A Little Difficulty moving from lying on back to sitting on the side of the bed? : A Lot Difficulty sitting down on and standing up from a chair with arms (e.g., wheelchair, bedside commode, etc,.)?: Unable Help needed moving to and from a bed to chair (including a wheelchair)?: A Little Help needed walking in hospital room?:  A Lot Help needed climbing 3-5 steps with a railing? : A Lot 6 Click Score: 13    End of Session   Activity Tolerance: Patient limited by pain Patient left: in bed;with call bell/phone within reach;with bed alarm set Nurse Communication: Mobility status PT Visit Diagnosis: Other abnormalities of gait and mobility (R26.89);Pain Pain - part of body: (back)    Time: 1505-6979 PT Time Calculation (min) (ACUTE ONLY): 22 min   Charges:   PT Evaluation $PT Eval Moderate Complexity: 1 Mod     PT G Codes:        Murray, PT, DPT [place] 08/16/2017, 9:27 AM

## 2017-08-16 NOTE — Progress Notes (Signed)
[facility]                                                                              Patient Demographics  Gregory Price, is a 66 y.o. male, DOB - 29-Oct-1950, LFY:[id number]  Admit date - 08/13/2017   Admitting Physician [name], MD  Outpatient Primary MD for the patient is System, Provider Not In  Outpatient specialists:   LOS - 3  days   Medical records reviewed and are as summarized below:    Chief Complaint  Patient presents with  . Back Pain       Brief summary   65yomalew/nicotine dep, COPD, IV heroin use, Hepatitis C,and anadmission on 9/28 for T5 compression fracture concerning for discitis/osteomyelitis.Blood cultures 9/28 revealed Serratia marcensandID recommended 6 weeks of abx. He was discharged on cipro, but he had not been taking it. He returned to the ED c/o increase in thoracic spine pain for 1 week. In the ED anMRInoted progressive collapse of T5 and T6 associated disc osteomyelitis, retropulsed bone and disc material resultingin severe central canal stenosis at T5-6 with T2 signal abnormality in the cord compatible with edeme, severe foraminal stenosis bilaterally at C5-6, progressive paraspinal and epidural enhancing soft tissue compatible with epidural abscess ventral to the cord at the T5-6 level, and paraspinal and epidural enhancement extends up to the T4 level without definite enhancement in the T4 vertebral body or changes at the T4-5 disc.UDS opioid positive, coccaine positive  Assessment & Plan    Principal Problem:   Epidural abscess, 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  -Neurosurgery was consulted, patient is not a surgical candidate due to noncompliance -Recommended jewett hyperextension brace, should be worn when patient is out of bed - PT recommended skilled nursing facility -Continue pain control, bowel regimen -2D echo showed EF of 85-27%, grade 1 diastolic dysfunction, no  vegetation -Infectious disease was consulted, recommended to stop vancomycin and ceftriaxone, placed on ciprofloxacin 750 mg twice a day for 6 weeks  Active Problems:    COPD (chronic obstructive pulmonary disease) (HCC) -Currently stable    Hyponatremia -Currently stable    Depression due to physical illness -Psychiatry was consulted due to suicidal ideation, not a candidate for inpatient psychiatry  - continue Prozac, gabapentin 300 mg TID    Polysubstance dependence including opioid (HCC)/IV drug use -Currently complaining of significant pain, avoid escalating doses of narcotics   Code Status: Full code DVT Prophylaxis:  Lovenox  Family Communication: Discussed in detail with the patient, all imaging results, lab results explained to the patient   Disposition Plan: Skilled nursing facility when bed available  Time Spent in minutes 25 minutes  Procedures:  None  Consultants:   Neurosurgery Infectious disease Psychiatry  Antimicrobials:      Medications  Scheduled Meds: . ciprofloxacin  750 mg Oral BID  . enoxaparin (LOVENOX) injection  40 mg Subcutaneous Q24H  . feeding supplement (ENSURE ENLIVE)  237 mL Oral BID BM  . FLUoxetine  40 mg Oral Daily  . folic acid  1 mg Oral Daily  . gabapentin  300 mg Oral TID  . nicotine  21 mg  Transdermal Daily  . senna-docusate  1 tablet Oral QHS  . thiamine  100 mg Oral Daily   Continuous Infusions: . sodium chloride 75 mL/hr at 08/16/17 0105   PRN Meds:.acetaminophen **OR** acetaminophen, hydrALAZINE, morphine injection, polyethylene glycol   Antibiotics   Anti-infectives (From admission, onward)   Start     Dose/Rate Route Frequency Ordered Stop   08/14/17 1100  ciprofloxacin (CIPRO) tablet 750 mg     750 mg Oral 2 times daily 08/14/17 0954     08/13/17 2200  ceFEPIme (MAXIPIME) 2 g in dextrose 5 % 50 mL IVPB  Status:  Discontinued     2 g 100 mL/hr over 30 Minutes Intravenous Every 8 hours 08/13/17 2101  08/14/17 0954   08/13/17 1745  cefTRIAXone (ROCEPHIN) 2 g in dextrose 5 % 50 mL IVPB     2 g 100 mL/hr over 30 Minutes Intravenous  Once 08/13/17 1732 08/13/17 2117   08/13/17 1745  vancomycin (VANCOCIN) IVPB 750 mg/150 ml premix     750 mg 150 mL/hr over 60 Minutes Intravenous  Once 08/13/17 1732 08/13/17 2216        Subjective:   Gregory Price was seen and examined today.  Complaining of back pain 10/10, no fevers or chills.  Patient denies dizziness, chest pain, shortness of breath, N/V, new weakness, numbess, tingling. No acute events overnight.    Objective:   Vitals:   08/15/17 1736 08/15/17 2106 08/16/17 0600 08/16/17 0900  BP: (!) 169/79 (!) 166/73 (!) 183/92 (!) 172/84  Pulse: 78 73 70 73  Resp: 19 18 18 18   Temp: 98.6 F (37 C) 98.2 F (36.8 C) 98.5 F (36.9 C) 98 F (36.7 C)  TempSrc: Oral Oral Oral Oral  SpO2: 98% 98% 97% 97%  Weight:      Height:        Intake/Output Summary (Last 24 hours) at 08/16/2017 1220 Last data filed at 08/16/2017 1146 Gross per 24 hour  Intake 2497.5 ml  Output 1200 ml  Net 1297.5 ml     Wt Readings from Last 3 Encounters:  08/14/17 54.3 kg (119 lb 11.4 oz)  07/07/17 63.5 kg (140 lb)  01/07/15 65.8 kg (145 lb)     Exam  General: Alert and oriented x 3, NAD  Eyes: PERRLA, EOMI, Anicteric Sclera,  HEENT:    Cardiovascular: S1 S2 auscultated, no rubs, murmurs or gallops. Regular rate and rhythm.  Respiratory: Clear to auscultation bilaterally, no wheezing, rales or rhonchi  Gastrointestinal: Soft, nontender, nondistended, + bowel sounds  Ext: no pedal edema bilaterally  Neuro: AAOx3, Cr N's II- XII. Strength 5/5 upper and lower extremities bilaterally, speech clear,  Musculoskeletal: No digital cyanosis, clubbing  Skin: No rashes  Psych: Normal affect and demeanor, alert and oriented x3    Data Reviewed:  I have personally reviewed following labs and imaging studies  Micro Results Recent Results (from the  past 240 hour(s))  Culture, blood (routine x 2)     Status: None (Preliminary result)   Collection Time: 08/13/17  2:03 PM  Result Value Ref Range Status   Specimen Description BLOOD RIGHT ANTECUBITAL  Final   Special Requests   Final    BOTTLES DRAWN AEROBIC AND ANAEROBIC Blood Culture results may not be optimal due to an inadequate volume of blood received in culture bottles   Culture NO GROWTH 2 DAYS  Final   Report Status PENDING  Incomplete  Culture, blood (routine x 2)     Status:  None (Preliminary result)   Collection Time: 08/13/17  8:59 PM  Result Value Ref Range Status   Specimen Description BLOOD LEFT UPPER ARM  Final   Special Requests   Final    BOTTLES DRAWN AEROBIC AND ANAEROBIC Blood Culture adequate volume   Culture NO GROWTH 2 DAYS  Final   Report Status PENDING  Incomplete  MRSA PCR Screening     Status: None   Collection Time: 08/13/17 10:46 PM  Result Value Ref Range Status   MRSA by PCR NEGATIVE NEGATIVE Final    Comment:        The GeneXpert MRSA Assay (FDA approved for NASAL specimens only), is one component of a comprehensive MRSA colonization surveillance program. It is not intended to diagnose MRSA infection nor to guide or monitor treatment for MRSA infections.     Radiology Reports Mr Thoracic Spine W Wo Contrast  Result Date: 08/13/2017 CLINICAL DATA:  Back pain, infection suspected. No disc osteomyelitis with progressive pain. EXAM: MRI THORACIC WITHOUT AND WITH CONTRAST TECHNIQUE: Multiplanar and multiecho pulse sequences of the thoracic spine were obtained without and with intravenous contrast. CONTRAST:  <See Chart> MULTIHANCE GADOBENATE DIMEGLUMINE 529 MG/ML IV SOLN COMPARISON:  MRI of the thoracic spine 07/07/2017. FINDINGS: MRI THORACIC SPINE FINDINGS Alignment: Further exaggerated kyphosis is present following near complete collapse of the T5 vertebral body. Vertebrae: There is further collapse of the T5 vertebral body in the superior  endplate T6. Significant retropulsed bone extends 10 mm into the spinal canal at T5-6. Cord: Focal T2 hyperintensity is present within the spinal cord at the T5-6 level. Paraspinal and other soft tissues: Progressive enhancing soft tissue is present within the paraspinal space at T5 and T6. Enhancing soft tissue extends to the inferior aspect of T4 without abnormal signal or enhancement in the T4-5 disc space. A paraspinal lipoma is present from the C7 through T2 on the left. Disc levels: The disc levels at L3-4 above are normal. T4-5:  Negative. T5-6: Severe central canal stenosis is present with distortion of the cord. Severe foraminal stenosis is now present bilaterally. No fluid or fat can be seen within either foramen. Enhancing epidural tissue extends superiorly to T4 and inferiorly to the T6-7 disc level. T6-7:  Negative. No significant disc protrusion or stenosis is present within the lower thoracic spine. IMPRESSION: 1. 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 2. Retropulsed bone and disc material results in severe central canal stenosis at T5-6 with T2 signal abnormality in the cord compatible with edema. 3. Severe foraminal stenosis bilaterally at C5-6. 4. Progressive paraspinal and epidural enhancing soft tissue compatible with epidural abscess ventral to the cord at the T5-6 level. 5. Paraspinal and epidural enhancement extends up to the T4 level without definite enhancement in the T4 vertebral body or changes at the T4-5 disc. These results were called by telephone at the time of interpretation on 08/13/2017 at 5:17 pm to Dr. Regenia Skeeter, who verbally acknowledged these results. Electronically Signed   By: San Morelle M.D.   On: 08/13/2017 17:19    Lab Data:  CBC: Recent Labs  Lab 08/13/17 1344 08/14/17 0521 08/15/17 0404 08/16/17 0311  WBC 12.4* 12.1* 11.3* 10.2  NEUTROABS 10.6*  --   --   --   HGB 14.6 13.9 13.1 12.6*  HCT 42.9 40.4 38.3* 37.4*  MCV 82.2  79.5 79.5 80.8  PLT 451* 443* 412* 540*   Basic Metabolic Panel: Recent Labs  Lab 08/13/17 1344 08/14/17 0521 08/15/17  0404 08/16/17 0311  NA 132* 133* 135 137  K 5.3* 3.0* 3.5 4.2  CL 95* 96* 105 107  CO2 26 21* 21* 23  GLUCOSE 94 109* 112* 102*  BUN 13 12 20  25*  CREATININE 0.78 0.75 0.85 0.76  CALCIUM 9.2 9.1 9.0 8.8*  MG  --   --  1.6*  --    GFR: Estimated Creatinine Clearance: 70.7 mL/min (by C-G formula based on SCr of 0.76 mg/dL). Liver Function Tests: Recent Labs  Lab 08/13/17 1344 08/14/17 0521 08/15/17 0404  AST 49* 19 14*  ALT 23 14* 12*  ALKPHOS 89 81 77  BILITOT 2.4* 1.5* 1.5*  PROT 7.7 7.2 6.4*  ALBUMIN 4.0 3.6 3.3*   No results for input(s): LIPASE, AMYLASE in the last 168 hours. No results for input(s): AMMONIA in the last 168 hours. Coagulation Profile: No results for input(s): INR, PROTIME in the last 168 hours. Cardiac Enzymes: No results for input(s): CKTOTAL, CKMB, CKMBINDEX, TROPONINI in the last 168 hours. BNP (last 3 results) No results for input(s): PROBNP in the last 8760 hours. HbA1C: No results for input(s): HGBA1C in the last 72 hours. CBG: No results for input(s): GLUCAP in the last 168 hours. Lipid Profile: No results for input(s): CHOL, HDL, LDLCALC, TRIG, CHOLHDL, LDLDIRECT in the last 72 hours. Thyroid Function Tests: No results for input(s): TSH, T4TOTAL, FREET4, T3FREE, THYROIDAB in the last 72 hours. Anemia Panel: No results for input(s): VITAMINB12, FOLATE, FERRITIN, TIBC, IRON, RETICCTPCT in the last 72 hours. Urine analysis:    Component Value Date/Time   COLORURINE YELLOW 08/13/2017 1547   APPEARANCEUR CLEAR 08/13/2017 1547   LABSPEC 1.018 08/13/2017 1547   PHURINE 7.0 08/13/2017 1547   GLUCOSEU NEGATIVE 08/13/2017 1547   HGBUR NEGATIVE 08/13/2017 1547   BILIRUBINUR NEGATIVE 08/13/2017 1547   KETONESUR NEGATIVE 08/13/2017 1547   PROTEINUR NEGATIVE 08/13/2017 1547   UROBILINOGEN 1.0 08/09/2014 1519   NITRITE  NEGATIVE 08/13/2017 1547   LEUKOCYTESUR NEGATIVE 08/13/2017 1547     Gregory Price M.D. [facility] 08/16/2017, 12:20 PM  Pager: [phone] Between 7am to 7pm - call Pager - [phone]  After 7pm go to [email or web address] - password [email or web address]  Call night coverage person covering after 7pm

## 2017-08-17 MED ORDER — OXYCODONE HCL 5 MG PO TABS
5.0000 mg | ORAL_TABLET | ORAL | Status: DC | PRN
Start: 2017-08-17 — End: 2017-08-20
  Administered 2017-08-17 – 2017-08-19 (×13): 10 mg via ORAL
  Administered 2017-08-19: 5 mg via ORAL
  Administered 2017-08-20 (×3): 10 mg via ORAL
  Filled 2017-08-17 (×17): qty 2

## 2017-08-17 MED ORDER — OXYCODONE HCL 5 MG PO TABS: 5.0000 mg | ORAL_TABLET | ORAL | 0 refills | Status: DC | PRN

## 2017-08-17 MED ORDER — FLUOXETINE HCL 40 MG PO CAPS: 40.0000 mg | ORAL_CAPSULE | Freq: Every day | ORAL | 0 refills | Status: DC

## 2017-08-17 MED ORDER — GABAPENTIN 300 MG PO CAPS: 300.0000 mg | ORAL_CAPSULE | Freq: Three times a day (TID) | ORAL | Status: DC

## 2017-08-17 MED ORDER — ACETAMINOPHEN 500 MG PO TABS
1000.0000 mg | ORAL_TABLET | Freq: Three times a day (TID) | ORAL | Status: DC
  Administered 2017-08-17 – 2017-08-20 (×9): 1000 mg via ORAL
  Filled 2017-08-17 (×10): qty 2

## 2017-08-17 MED ORDER — ACETAMINOPHEN 500 MG PO TABS: 1000.0000 mg | ORAL_TABLET | Freq: Three times a day (TID) | ORAL | 0 refills | Status: DC

## 2017-08-17 MED ORDER — IBUPROFEN 200 MG PO TABS
200.0000 mg | ORAL_TABLET | Freq: Three times a day (TID) | ORAL | Status: DC | PRN
  Administered 2017-08-17 – 2017-08-18 (×2): 200 mg via ORAL
  Filled 2017-08-17 (×2): qty 1

## 2017-08-17 MED ORDER — ENSURE ENLIVE PO LIQD: 237.0000 mL | Freq: Two times a day (BID) | ORAL | 12 refills | Status: DC

## 2017-08-17 MED ORDER — CIPROFLOXACIN HCL 750 MG PO TABS: 750.0000 mg | ORAL_TABLET | Freq: Two times a day (BID) | ORAL | Status: DC

## 2017-08-17 NOTE — Progress Notes (Signed)
[facility]                                                                              Patient Demographics  Gregory Price, is a 66 y.o. male, DOB - 28-Oct-1950, MOQ:[id number]  Admit date - 08/13/2017   Admitting Physician [name], MD  Outpatient Primary MD for the patient is System, Provider Not In  Outpatient specialists:   LOS - 4  days   Medical records reviewed and are as summarized below:    Chief Complaint  Patient presents with  . Back Pain       Brief summary   65yomalew/nicotine dep, COPD, IV heroin use, Hepatitis C,and anadmission on 9/28 for T5 compression fracture concerning for discitis/osteomyelitis.Blood cultures 9/28 revealed Serratia marcensandID recommended 6 weeks of abx. He was discharged on cipro, but he had not been taking it. He returned to the ED c/o increase in thoracic spine pain for 1 week. In the ED anMRInoted progressive collapse of T5 and T6 associated disc osteomyelitis, retropulsed bone and disc material resultingin severe central canal stenosis at T5-6 with T2 signal abnormality in the cord compatible with edeme, severe foraminal stenosis bilaterally at C5-6, progressive paraspinal and epidural enhancing soft tissue compatible with epidural abscess ventral to the cord at the T5-6 level, and paraspinal and epidural enhancement extends up to the T4 level without definite enhancement in the T4 vertebral body or changes at the T4-5 disc.UDS opioid positive, coccaine positive  Assessment & Plan    Principal Problem:   Epidural abscess, 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  -Neurosurgery was consulted, patient is not a surgical candidate due to noncompliance -Recommended jewett hyperextension brace, should be worn when patient is out of bed - PT recommended skilled nursing facility - Continue pain control, bowel regimen - 2D echo showed EF of 35-46%, grade 1 diastolic dysfunction, no  vegetation - Infectious disease was consulted, recommended to stop vancomycin and ceftriaxone, placed on ciprofloxacin 750 mg twice a day for 6 weeks  Active Problems:    COPD (chronic obstructive pulmonary disease) (HCC) -Currently stable, no wheezing     Hyponatremia -Currently stable and improving     Depression due to physical illness - Psychiatry was consulted due to suicidal ideation, not a candidate for inpatient psychiatry  - continue Prozac, increased to 40mg  daily, gabapentin increased to 300 mg TID    Polysubstance dependence including opioid (HCC)/IV drug use - Currently complaining of significant pain due to compression fracture, hence placed on pain medications, toradol, oxycodone PRN.  -  Suboxone on hold until patient has improved and he can resume Suboxone at that time.  Or patient can be placed on methadone taper outpatient.     Code Status: Full code DVT Prophylaxis:  Lovenox  Family Communication: Discussed in detail with the patient, all imaging results, lab results explained to the patient   Disposition Plan: Skilled nursing facility when bed available  Time Spent in minutes 15 minutes  Procedures:  None  Consultants:   Neurosurgery Infectious disease Psychiatry  Antimicrobials:      Medications  Scheduled Meds: . acetaminophen  1,000 mg Oral  Q8H  . ciprofloxacin  750 mg Oral BID  . enoxaparin (LOVENOX) injection  40 mg Subcutaneous Q24H  . feeding supplement (ENSURE ENLIVE)  237 mL Oral BID BM  . FLUoxetine  40 mg Oral Daily  . folic acid  1 mg Oral Daily  . gabapentin  300 mg Oral TID  . nicotine  21 mg Transdermal Daily  . senna-docusate  1 tablet Oral QHS  . thiamine  100 mg Oral Daily   Continuous Infusions:  PRN Meds:.acetaminophen **OR** acetaminophen, hydrALAZINE, HYDROmorphone (DILAUDID) injection, ibuprofen, oxyCODONE, polyethylene glycol   Antibiotics   Anti-infectives (From admission, onward)   Start     Dose/Rate  Route Frequency Ordered Stop   08/17/17 0000  ciprofloxacin (CIPRO) 750 MG tablet     750 mg Oral 2 times daily 08/17/17 1257     08/14/17 1100  ciprofloxacin (CIPRO) tablet 750 mg     750 mg Oral 2 times daily 08/14/17 0954     08/13/17 2200  ceFEPIme (MAXIPIME) 2 g in dextrose 5 % 50 mL IVPB  Status:  Discontinued     2 g 100 mL/hr over 30 Minutes Intravenous Every 8 hours 08/13/17 2101 08/14/17 0954   08/13/17 1745  cefTRIAXone (ROCEPHIN) 2 g in dextrose 5 % 50 mL IVPB     2 g 100 mL/hr over 30 Minutes Intravenous  Once 08/13/17 1732 08/13/17 2117   08/13/17 1745  vancomycin (VANCOCIN) IVPB 750 mg/150 ml premix     750 mg 150 mL/hr over 60 Minutes Intravenous  Once 08/13/17 1732 08/13/17 2216        Subjective:   Juma Oxley was seen and examined today.  C/o back pain, no neurological deficits. Patient denies dizziness, chest pain, shortness of breath, N/V. No acute events overnight.    Objective:   Vitals:   08/16/17 0900 08/16/17 1723 08/16/17 2059 08/17/17 0754  BP: (!) 172/84 (!) 157/69 (!) 152/73 (!) 150/66  Pulse: 73 68 73 74  Resp: 18 17 17 19   Temp: 98 F (36.7 C) 98.8 F (37.1 C) 97.6 F (36.4 C) 98.6 F (37 C)  TempSrc: Oral Oral Oral Oral  SpO2: 97% 99% 98% 98%  Weight:      Height:        Intake/Output Summary (Last 24 hours) at 08/17/2017 1648 Last data filed at 08/17/2017 0900 Gross per 24 hour  Intake 717 ml  Output 650 ml  Net 67 ml     Wt Readings from Last 3 Encounters:  08/14/17 54.3 kg (119 lb 11.4 oz)  07/07/17 63.5 kg (140 lb)  01/07/15 65.8 kg (145 lb)     Exam   General: Alert and oriented x 3, uncomfortable  Eyes:   HEENT:    Cardiovascular: S1 S2 auscultated, no rubs, murmurs or gallops. Regular rate and rhythm. No pedal edema b/l  Respiratory: Clear to auscultation bilaterally, no wheezing, rales or rhonchi  Gastrointestinal: Soft, nontender, nondistended, + bowel sounds  Ext: no pedal edema bilaterally  Neuro: no  new deficits  Musculoskeletal: No digital cyanosis, clubbing  Skin: No rashes  Psych: Normal affect and demeanor, alert and oriented x3     Data Reviewed:  I have personally reviewed following labs and imaging studies  Micro Results Recent Results (from the past 240 hour(s))  Culture, blood (routine x 2)     Status: None (Preliminary result)   Collection Time: 08/13/17  2:03 PM  Result Value Ref Range Status   Specimen Description BLOOD  RIGHT ANTECUBITAL  Final   Special Requests   Final    BOTTLES DRAWN AEROBIC AND ANAEROBIC Blood Culture results may not be optimal due to an inadequate volume of blood received in culture bottles   Culture NO GROWTH 4 DAYS  Final   Report Status PENDING  Incomplete  Culture, blood (routine x 2)     Status: None (Preliminary result)   Collection Time: 08/13/17  8:59 PM  Result Value Ref Range Status   Specimen Description BLOOD LEFT UPPER ARM  Final   Special Requests   Final    BOTTLES DRAWN AEROBIC AND ANAEROBIC Blood Culture adequate volume   Culture NO GROWTH 4 DAYS  Final   Report Status PENDING  Incomplete  MRSA PCR Screening     Status: None   Collection Time: 08/13/17 10:46 PM  Result Value Ref Range Status   MRSA by PCR NEGATIVE NEGATIVE Final    Comment:        The GeneXpert MRSA Assay (FDA approved for NASAL specimens only), is one component of a comprehensive MRSA colonization surveillance program. It is not intended to diagnose MRSA infection nor to guide or monitor treatment for MRSA infections.     Radiology Reports Mr Thoracic Spine W Wo Contrast  Result Date: 08/13/2017 CLINICAL DATA:  Back pain, infection suspected. No disc osteomyelitis with progressive pain. EXAM: MRI THORACIC WITHOUT AND WITH CONTRAST TECHNIQUE: Multiplanar and multiecho pulse sequences of the thoracic spine were obtained without and with intravenous contrast. CONTRAST:  <See Chart> MULTIHANCE GADOBENATE DIMEGLUMINE 529 MG/ML IV SOLN COMPARISON:   MRI of the thoracic spine 07/07/2017. FINDINGS: MRI THORACIC SPINE FINDINGS Alignment: Further exaggerated kyphosis is present following near complete collapse of the T5 vertebral body. Vertebrae: There is further collapse of the T5 vertebral body in the superior endplate T6. Significant retropulsed bone extends 10 mm into the spinal canal at T5-6. Cord: Focal T2 hyperintensity is present within the spinal cord at the T5-6 level. Paraspinal and other soft tissues: Progressive enhancing soft tissue is present within the paraspinal space at T5 and T6. Enhancing soft tissue extends to the inferior aspect of T4 without abnormal signal or enhancement in the T4-5 disc space. A paraspinal lipoma is present from the C7 through T2 on the left. Disc levels: The disc levels at L3-4 above are normal. T4-5:  Negative. T5-6: Severe central canal stenosis is present with distortion of the cord. Severe foraminal stenosis is now present bilaterally. No fluid or fat can be seen within either foramen. Enhancing epidural tissue extends superiorly to T4 and inferiorly to the T6-7 disc level. T6-7:  Negative. No significant disc protrusion or stenosis is present within the lower thoracic spine. IMPRESSION: 1. 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 2. Retropulsed bone and disc material results in severe central canal stenosis at T5-6 with T2 signal abnormality in the cord compatible with edema. 3. Severe foraminal stenosis bilaterally at C5-6. 4. Progressive paraspinal and epidural enhancing soft tissue compatible with epidural abscess ventral to the cord at the T5-6 level. 5. Paraspinal and epidural enhancement extends up to the T4 level without definite enhancement in the T4 vertebral body or changes at the T4-5 disc. These results were called by telephone at the time of interpretation on 08/13/2017 at 5:17 pm to Dr. Regenia Skeeter, who verbally acknowledged these results. Electronically Signed   By: San Morelle M.D.   On: 08/13/2017 17:19    Lab Data:  CBC: Recent Labs  Lab  08/13/17 1344 08/14/17 0521 08/15/17 0404 08/16/17 0311  WBC 12.4* 12.1* 11.3* 10.2  NEUTROABS 10.6*  --   --   --   HGB 14.6 13.9 13.1 12.6*  HCT 42.9 40.4 38.3* 37.4*  MCV 82.2 79.5 79.5 80.8  PLT 451* 443* 412* 497*   Basic Metabolic Panel: Recent Labs  Lab 08/13/17 1344 08/14/17 0521 08/15/17 0404 08/16/17 0311  NA 132* 133* 135 137  K 5.3* 3.0* 3.5 4.2  CL 95* 96* 105 107  CO2 26 21* 21* 23  GLUCOSE 94 109* 112* 102*  BUN 13 12 20  25*  CREATININE 0.78 0.75 0.85 0.76  CALCIUM 9.2 9.1 9.0 8.8*  MG  --   --  1.6*  --    GFR: Estimated Creatinine Clearance: 70.7 mL/min (by C-G formula based on SCr of 0.76 mg/dL). Liver Function Tests: Recent Labs  Lab 08/13/17 1344 08/14/17 0521 08/15/17 0404  AST 49* 19 14*  ALT 23 14* 12*  ALKPHOS 89 81 77  BILITOT 2.4* 1.5* 1.5*  PROT 7.7 7.2 6.4*  ALBUMIN 4.0 3.6 3.3*   No results for input(s): LIPASE, AMYLASE in the last 168 hours. No results for input(s): AMMONIA in the last 168 hours. Coagulation Profile: No results for input(s): INR, PROTIME in the last 168 hours. Cardiac Enzymes: No results for input(s): CKTOTAL, CKMB, CKMBINDEX, TROPONINI in the last 168 hours. BNP (last 3 results) No results for input(s): PROBNP in the last 8760 hours. HbA1C: No results for input(s): HGBA1C in the last 72 hours. CBG: No results for input(s): GLUCAP in the last 168 hours. Lipid Profile: No results for input(s): CHOL, HDL, LDLCALC, TRIG, CHOLHDL, LDLDIRECT in the last 72 hours. Thyroid Function Tests: No results for input(s): TSH, T4TOTAL, FREET4, T3FREE, THYROIDAB in the last 72 hours. Anemia Panel: No results for input(s): VITAMINB12, FOLATE, FERRITIN, TIBC, IRON, RETICCTPCT in the last 72 hours. Urine analysis:    Component Value Date/Time   COLORURINE YELLOW 08/13/2017 1547   APPEARANCEUR CLEAR 08/13/2017 1547   LABSPEC 1.018 08/13/2017 1547     PHURINE 7.0 08/13/2017 1547   GLUCOSEU NEGATIVE 08/13/2017 1547   HGBUR NEGATIVE 08/13/2017 1547   BILIRUBINUR NEGATIVE 08/13/2017 1547   KETONESUR NEGATIVE 08/13/2017 1547   PROTEINUR NEGATIVE 08/13/2017 1547   UROBILINOGEN 1.0 08/09/2014 1519   NITRITE NEGATIVE 08/13/2017 1547   LEUKOCYTESUR NEGATIVE 08/13/2017 1547     Ripudeep Rai M.D. [facility] 08/17/2017, 4:48 PM  Pager: [phone] Between 7am to 7pm - call Pager - [phone]  After 7pm go to [email or web address] - password [email or web address]  Call night coverage person covering after 7pm

## 2017-08-17 NOTE — Care Management Important Message (Signed)
Important Message  Patient Details  Name: Gregory Price MRN: [id number] Date of Birth: 04-15-1951   Medicare Important Message Given:  Yes    Nathen May 08/17/2017, 12:45 PM

## 2017-08-17 NOTE — Discharge Summary (Signed)
Physician Discharge Summary   Patient ID: Gregory Price MRN: [id number] DOB/AGE: 1951-01-20 66 y.o.  Admit date: 08/13/2017 Discharge date: 08/19/2017  Primary Care Physician:  System, Provider Not In  Discharge Diagnoses:    . Epidural abscess   T5 thoracic spine compression fracture . Acute on chronic back pain . COPD (chronic obstructive pulmonary disease) (HCC) Hyponatremia Depression due to physical illness History of polysubstance abuse   Consults: Infectious disease Neurosurgery Psychiatry  Recommendations for Outpatient Follow-up:  1. Jewett hyperextension brace which he should wear when he is out of bed 2. Please repeat CBC/BMET at next visit 3. Ciprofloxacin 750 mg twice a day for 6 weeks, stop date on September 25, 2017   DIET: Regular diet    Allergies:   Allergies  Allergen Reactions  . Bee Venom Anaphylaxis     DISCHARGE MEDICATIONS: Current Discharge Medication List    START taking these medications   Details  ciprofloxacin (CIPRO) 750 MG tablet Take 1 tablet (750 mg total) 2 (two) times daily by mouth. X 6 weeks. STOP date on 09/25/17 Qty: 90 tablet, Refills: 0    feeding supplement, ENSURE ENLIVE, (ENSURE ENLIVE) LIQD Take 237 mLs 2 (two) times daily between meals by mouth. Qty: 237 mL, Refills: 12    lidocaine (LIDODERM) 5 % Place 1 patch daily onto the skin. Remove & Discard patch within 12 hours or as directed by MD Qty: 30 patch, Refills: 1    oxyCODONE (OXY IR/ROXICODONE) 5 MG immediate release tablet Take 1-2 tablets (5-10 mg total) every 4 (four) hours as needed by mouth for moderate pain, severe pain or breakthrough pain. Qty: 30 tablet, Refills: 0      CONTINUE these medications which have CHANGED   Details  FLUoxetine (PROZAC) 40 MG capsule Take 1 capsule (40 mg total) daily by mouth. Qty: 30 capsule, Refills: 0    gabapentin (NEURONTIN) 300 MG capsule Take 1 capsule (300 mg total) 3 (three) times daily by mouth. Qty: 90  capsule, Refills: 2      CONTINUE these medications which have NOT CHANGED   Details  folic acid (FOLVITE) 1 MG tablet Take 1 tablet (1 mg total) by mouth daily. Qty: 30 tablet, Refills: 0    ibuprofen (ADVIL,MOTRIN) 200 MG tablet Take 1 tablet (200 mg total) by mouth every 8 (eight) hours as needed (for pain). Qty: 30 tablet, Refills: 0    nicotine (NICODERM CQ - DOSED IN MG/24 HOURS) 21 mg/24hr patch Place 1 patch (21 mg total) onto the skin daily. Qty: 28 patch, Refills: 0    polyethylene glycol (MIRALAX / GLYCOLAX) packet Take 17 g by mouth daily as needed for mild constipation. Qty: 14 each, Refills: 0    senna-docusate (SENOKOT-S) 8.6-50 MG tablet Take 1 tablet by mouth at bedtime. Qty: 30 tablet, Refills: 0    thiamine 100 MG tablet Take 1 tablet (100 mg total) by mouth daily. Qty: 30 tablet, Refills: 0    Multiple Vitamin (MULTIVITAMIN WITH MINERALS) TABS tablet Take 1 tablet by mouth daily. Qty: 30 tablet, Refills: 0      STOP taking these medications     buprenorphine-naloxone (SUBOXONE) 8-2 mg SUBL SL tablet          Brief H and P: For complete details please refer to admission H and P, but in brief65yomalew/nicotine dep, COPD, IV heroin use, Hepatitis C,and anadmission on 9/28 for T5 compression fracture concerning for discitis/osteomyelitis.Blood cultures 9/28 revealed Serratia marcensandID recommended 6 weeks of abx. He  was discharged on cipro, but he had not been taking it. He returned to the ED c/o increase in thoracic spine pain for 1 week. In the ED anMRInoted progressive collapse of T5 and T6 associated disc osteomyelitis, retropulsed bone and disc material resultingin severe central canal stenosis at T5-6 with T2 signal abnormality in the cord compatible with edeme, severe foraminal stenosis bilaterally at C5-6, progressive paraspinal and epidural enhancing soft tissue compatible with epidural abscess ventral to the cord at the T5-6 level, and  paraspinal and epidural enhancement extends up to the T4 level without definite enhancement in the T4 vertebral body or changes at the T4-5 disc.UDS opioid positive, cocaine positive     Hospital Course:  Epidural abscess, 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  -Neurosurgery was consulted, patient is not a surgical candidate due to noncompliance -Recommended jewett hyperextension brace, should be worn when patient is out of bed - PT recommended skilled nursing facility -Continue pain control, bowel regimen -2D echo showed EF of 79-02%, grade 1 diastolic dysfunction, no vegetation -Infectious disease was consulted, recommended to stop vancomycin and ceftriaxone, placed on ciprofloxacin 750 mg twice a day for 6 weeks     COPD (chronic obstructive pulmonary disease) (HCC) -Currently stable, no wheezing    Hyponatremia -Currently stable, sodium 137    Depression due to physical illness -Psychiatry was consulted due to suicidal ideation, not a candidate for inpatient psychiatry  - continue Prozac increased to 40 mg daily, gabapentin increased to 300 mg TID    Polysubstance dependence including opioid (HCC)/IV drug use -Currently complaining of significant pain due to compression fracture, hence on pain medications.   - Suboxone on hold until patient has improved and he can resume Suboxone at that time.  Or patient can be placed on methadone taper outpatient by his pain management physician.    Day of Discharge BP (!) 156/88 (BP Location: Right Arm)   Pulse 68   Temp 98.5 F (36.9 C) (Oral)   Resp 17   Ht 5\' 6"  (1.676 m)   Wt 54.4 kg (119 lb 14.9 oz)   SpO2 100%   BMI 19.36 kg/m   Physical Exam: General: Alert and awake oriented x3 HEENT: anicteric sclera, pupils reactive to light and accommodation CVS: S1-S2 clear no murmur rubs or gallops Chest: clear to auscultation bilaterally, no wheezing rales or rhonchi Abdomen: soft nontender,  nondistended, normal bowel sounds Extremities: no cyanosis, clubbing or edema noted bilaterally Neuro: No new deficits   The results of significant diagnostics from this hospitalization (including imaging, microbiology, ancillary and laboratory) are listed below for reference.    LAB RESULTS: Basic Metabolic Panel: Recent Labs  Lab 08/15/17 0404 08/16/17 0311  NA 135 137  K 3.5 4.2  CL 105 107  CO2 21* 23  GLUCOSE 112* 102*  BUN 20 25*  CREATININE 0.85 0.76  CALCIUM 9.0 8.8*  MG 1.6*  --    Liver Function Tests: Recent Labs  Lab 08/14/17 0521 08/15/17 0404  AST 19 14*  ALT 14* 12*  ALKPHOS 81 77  BILITOT 1.5* 1.5*  PROT 7.2 6.4*  ALBUMIN 3.6 3.3*   No results for input(s): LIPASE, AMYLASE in the last 168 hours. No results for input(s): AMMONIA in the last 168 hours. CBC: Recent Labs  Lab 08/13/17 1344  08/15/17 0404 08/16/17 0311  WBC 12.4*   < > 11.3* 10.2  NEUTROABS 10.6*  --   --   --   HGB 14.6   < >  13.1 12.6*  HCT 42.9   < > 38.3* 37.4*  MCV 82.2   < > 79.5 80.8  PLT 451*   < > 412* 408*   < > = values in this interval not displayed.   Cardiac Enzymes: No results for input(s): CKTOTAL, CKMB, CKMBINDEX, TROPONINI in the last 168 hours. BNP: Invalid input(s): POCBNP CBG: No results for input(s): GLUCAP in the last 168 hours.  Significant Diagnostic Studies:  Mr Thoracic Spine W Wo Contrast  Result Date: 08/13/2017 CLINICAL DATA:  Back pain, infection suspected. No disc osteomyelitis with progressive pain. EXAM: MRI THORACIC WITHOUT AND WITH CONTRAST TECHNIQUE: Multiplanar and multiecho pulse sequences of the thoracic spine were obtained without and with intravenous contrast. CONTRAST:  <See Chart> MULTIHANCE GADOBENATE DIMEGLUMINE 529 MG/ML IV SOLN COMPARISON:  MRI of the thoracic spine 07/07/2017. FINDINGS: MRI THORACIC SPINE FINDINGS Alignment: Further exaggerated kyphosis is present following near complete collapse of the T5 vertebral body.  Vertebrae: There is further collapse of the T5 vertebral body in the superior endplate T6. Significant retropulsed bone extends 10 mm into the spinal canal at T5-6. Cord: Focal T2 hyperintensity is present within the spinal cord at the T5-6 level. Paraspinal and other soft tissues: Progressive enhancing soft tissue is present within the paraspinal space at T5 and T6. Enhancing soft tissue extends to the inferior aspect of T4 without abnormal signal or enhancement in the T4-5 disc space. A paraspinal lipoma is present from the C7 through T2 on the left. Disc levels: The disc levels at L3-4 above are normal. T4-5:  Negative. T5-6: Severe central canal stenosis is present with distortion of the cord. Severe foraminal stenosis is now present bilaterally. No fluid or fat can be seen within either foramen. Enhancing epidural tissue extends superiorly to T4 and inferiorly to the T6-7 disc level. T6-7:  Negative. No significant disc protrusion or stenosis is present within the lower thoracic spine. IMPRESSION: 1. 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 2. Retropulsed bone and disc material results in severe central canal stenosis at T5-6 with T2 signal abnormality in the cord compatible with edema. 3. Severe foraminal stenosis bilaterally at C5-6. 4. Progressive paraspinal and epidural enhancing soft tissue compatible with epidural abscess ventral to the cord at the T5-6 level. 5. Paraspinal and epidural enhancement extends up to the T4 level without definite enhancement in the T4 vertebral body or changes at the T4-5 disc. These results were called by telephone at the time of interpretation on 08/13/2017 at 5:17 pm to Dr. Regenia Skeeter, who verbally acknowledged these results. Electronically Signed   By: San Morelle M.D.   On: 08/13/2017 17:19    2D ECHO: Study Conclusions  - Left ventricle: The cavity size was normal. There was mild   concentric hypertrophy. Systolic function was  vigorous. The   estimated ejection fraction was in the range of 65% to 70%. Wall   motion was normal; there were no regional wall motion   abnormalities. Doppler parameters are consistent with abnormal   left ventricular relaxation (grade 1 diastolic dysfunction). The   Ee&' ratio is <8, suggesting normal LV filling pressure. - Aortic valve: Trileaflet. Sclerosis without stenosis. There was   trivial regurgitation. - Mitral valve: Mildly thickened leaflets . There was trivial   regurgitation. - Left atrium: The atrium was normal in size. - Tricuspid valve: There was trivial regurgitation. - Pulmonary arteries: PA peak pressure: 31 mm Hg (S). - Inferior vena cava: The vessel was normal in size.  The   respirophasic diameter changes were in the normal range (= 50%),   consistent with normal central venous pressure.  Impressions:  - LVEF 65-70%, mild LVH, normal wall motion, grade 1 DD, normal LV   filling pressure, aortic sclerosis with trivial AI, trivial MR,   normal LA size, trivial TR, RVSP 31 mmHg, normal IVC.   Disposition and Follow-up: Discharge Instructions    Diet general   Complete by:  As directed    Increase activity slowly   Complete by:  As directed        DISPOSITION: SNF   DISCHARGE FOLLOW-UP Follow-up Information    [facility]              Follow up on 08/29/2017.   Why:  Appointment with Colletta Maryland, NP at 2:00. Please arrive 15 min early for your appointment.  Contact information: [address] [place] [address]       [facility] Follow up.   Why:  Call for appt with a PCP - inform that this is post discharge from hospital follow up Contact information: [place] [address] [facility] Follow up.   Why:  Please call for post discharge follow up appt Contact  information:    Address: [address], [place], [place] [address]        Hours: Closed  Opens 8:30AM Mon                                              [address] [place], [place] [address]  Phone: [phone]       [facility] Follow up.   Why:  Please call for post discharge follow up appt Contact information:  [address], [place], [place] [address] [phone]           Time spent on Discharge: 35 mins  Signed:   Estill Cotta M.D. [facility] 08/19/2017, 10:17 AM Pager: [phone]

## 2017-08-18 LAB — CULTURE, BLOOD (ROUTINE X 2)
CULTURE: NO GROWTH
CULTURE: NO GROWTH
Special Requests: ADEQUATE

## 2017-08-18 MED ORDER — LIDOCAINE 5 % EX PTCH: 1.0000 | MEDICATED_PATCH | CUTANEOUS | 0 refills | Status: DC

## 2017-08-18 MED ORDER — LIDOCAINE 5 % EX PTCH
1.0000 | MEDICATED_PATCH | CUTANEOUS | Status: DC
  Administered 2017-08-18 – 2017-08-20 (×3): 1 via TRANSDERMAL
  Filled 2017-08-18 (×3): qty 1

## 2017-08-18 NOTE — Clinical Social Work Note (Addendum)
Discharge planning continued today for Gregory Price. [facility] was considering patient, however per admissions director Freda Munro, her administrator declined patient. CSW consulted with social work Surveyor, quantity, Nathaniel Man regarding patient. The SNF search was extensive and there are no other SNF's that Gregory Price could recommend for patient. CSW talked by phone (in patient's room) with [name] [phone]) regarding his discharge disposition and he was updated regarding [facility]. Gregory Price indicated that she will be out of town this weekend and is getting ready to move and cannot have patient live with her at this time.   At patient's request, his daughter Gregory Price was called [phone]) while in room with patient. His daughter is willing to have her father live with her temporarily and can pick him up on Saturday, 11/10.  CSW talked with patient regarding considering allowing his daughter to be the payee for his SSA check and patient indicated that they will discuss this. CSW thanks Gregory Price for his patience and for working with CSW to find a safe discharge plan for him.  CSW signing off as patient will discharge to his daughter's home and she will pick him up on Saturday, however please reconsult if any additional SW intervention needed prior to discharge. Daughter advised to call the unit and let his nurse know her estimated arrival time.  Gregory Price, MSW, LCSW Licensed Clinical Social Worker [place] [phone]

## 2017-08-18 NOTE — Progress Notes (Signed)
[facility]                                                                              Patient Demographics  Gregory Price, is a 66 y.o. male, DOB - 05-30-1951, HQI:[id number]  Admit date - 08/13/2017   Admitting Physician [name], MD  Outpatient Primary MD for the patient is System, Provider Not In  Outpatient specialists:   LOS - 5  days   Medical records reviewed and are as summarized below:    Chief Complaint  Patient presents with  . Back Pain       Brief summary   65yomalew/nicotine dep, COPD, IV heroin use, Hepatitis C,and anadmission on 9/28 for T5 compression fracture concerning for discitis/osteomyelitis.Blood cultures 9/28 revealed Serratia marcensandID recommended 6 weeks of abx. He was discharged on cipro, but he had not been taking it. He returned to the ED c/o increase in thoracic spine pain for 1 week. In the ED anMRInoted progressive collapse of T5 and T6 associated disc osteomyelitis, retropulsed bone and disc material resultingin severe central canal stenosis at T5-6 with T2 signal abnormality in the cord compatible with edeme, severe foraminal stenosis bilaterally at C5-6, progressive paraspinal and epidural enhancing soft tissue compatible with epidural abscess ventral to the cord at the T5-6 level, and paraspinal and epidural enhancement extends up to the T4 level without definite enhancement in the T4 vertebral body or changes at the T4-5 disc.UDS opioid positive, coccaine positive  Assessment & Plan    Principal Problem:   Epidural abscess, 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  -Neurosurgery was consulted, patient is not a surgical candidate due to noncompliance -Recommended jewett hyperextension brace, should be worn when patient is out of bed - PT recommended skilled nursing facility - Continue pain control, bowel regimen - 2D echo showed EF of 13-24%, grade 1 diastolic dysfunction, no  vegetation - Infectious disease was consulted, recommended to stop vancomycin and ceftriaxone, placed on ciprofloxacin 750 mg twice a day for 6 weeks - added lidoderm patch for pain, cont current regimen   Active Problems:    COPD (chronic obstructive pulmonary disease) (HCC) -Currently stable, no wheezing     Hyponatremia -Currently stable and improving     Depression due to physical illness - Psychiatry was consulted due to suicidal ideation, not a candidate for inpatient psychiatry  - continue Prozac, increased to 40mg  daily, gabapentin increased to 300 mg TID    Polysubstance dependence including opioid (HCC)/IV drug use - Currently complaining of significant pain due to compression fracture, hence placed on pain medications, toradol, oxycodone PRN.  -  Suboxone on hold until patient has improved and he can resume Suboxone at that time.  Or patient can be placed on methadone taper outpatient. - added lidoderm patch    Code Status: Full code DVT Prophylaxis:  Lovenox  Family Communication: Discussed in detail with the patient, all imaging results, lab results explained to the patient   Disposition Plan: Skilled nursing facility when bed available  Time Spent in minutes 15 minutes  Procedures:  None  Consultants:   Neurosurgery Infectious disease Psychiatry  Antimicrobials:  Medications  Scheduled Meds: . acetaminophen  1,000 mg Oral Q8H  . ciprofloxacin  750 mg Oral BID  . enoxaparin (LOVENOX) injection  40 mg Subcutaneous Q24H  . feeding supplement (ENSURE ENLIVE)  237 mL Oral BID BM  . FLUoxetine  40 mg Oral Daily  . folic acid  1 mg Oral Daily  . gabapentin  300 mg Oral TID  . lidocaine  1 patch Transdermal Q24H  . nicotine  21 mg Transdermal Daily  . senna-docusate  1 tablet Oral QHS  . thiamine  100 mg Oral Daily   Continuous Infusions:  PRN Meds:.acetaminophen **OR** acetaminophen, hydrALAZINE, HYDROmorphone (DILAUDID) injection, ibuprofen,  oxyCODONE, polyethylene glycol   Antibiotics   Anti-infectives (From admission, onward)   Start     Dose/Rate Route Frequency Ordered Stop   08/17/17 0000  ciprofloxacin (CIPRO) 750 MG tablet     750 mg Oral 2 times daily 08/17/17 1257     08/14/17 1100  ciprofloxacin (CIPRO) tablet 750 mg     750 mg Oral 2 times daily 08/14/17 0954     08/13/17 2200  ceFEPIme (MAXIPIME) 2 g in dextrose 5 % 50 mL IVPB  Status:  Discontinued     2 g 100 mL/hr over 30 Minutes Intravenous Every 8 hours 08/13/17 2101 08/14/17 0954   08/13/17 1745  cefTRIAXone (ROCEPHIN) 2 g in dextrose 5 % 50 mL IVPB     2 g 100 mL/hr over 30 Minutes Intravenous  Once 08/13/17 1732 08/13/17 2117   08/13/17 1745  vancomycin (VANCOCIN) IVPB 750 mg/150 ml premix     750 mg 150 mL/hr over 60 Minutes Intravenous  Once 08/13/17 1732 08/13/17 2216        Subjective:   Dontrae Morini was seen and examined today.  C/o back pain, states pain medication helping "little".  Patient denies dizziness, chest pain, shortness of breath, N/V. No acute events overnight.    Objective:   Vitals:   08/17/17 1732 08/17/17 2126 08/18/17 0514 08/18/17 1000  BP: 133/71 (!) 139/57 (!) 167/89 (!) 152/61  Pulse: 79 73 64 72  Resp: 18 19 17 18   Temp: 99.3 F (37.4 C) 98.2 F (36.8 C) 98.4 F (36.9 C) 98.2 F (36.8 C)  TempSrc: Oral Oral  Oral  SpO2: 99% 99% 99% 98%  Weight:  54.4 kg (119 lb 14.9 oz)    Height:        Intake/Output Summary (Last 24 hours) at 08/18/2017 1652 Last data filed at 08/18/2017 1347 Gross per 24 hour  Intake 420 ml  Output 0 ml  Net 420 ml     Wt Readings from Last 3 Encounters:  08/17/17 54.4 kg (119 lb 14.9 oz)  07/07/17 63.5 kg (140 lb)  01/07/15 65.8 kg (145 lb)     Exam  Physical Exam  General: Alert and oriented x 3, NAD, uncomfortable   Eyes:   HEENT:    Cardiovascular: S1 S2 clear, RRR No pedal edema b/l  Respiratory: CTAB  Gastrointestinal: Soft, nontender, nondistended, + bowel  sounds  Ext: no pedal edema bilaterally  Neuro: no new deficits  Musculoskeletal: No digital cyanosis, clubbing  Skin: No rashes  Psych: Normal affect and demeanor, alert and oriented x3      Data Reviewed:  I have personally reviewed following labs and imaging studies  Micro Results Recent Results (from the past 240 hour(s))  Culture, blood (routine x 2)     Status: None   Collection Time: 08/13/17  2:03  PM  Result Value Ref Range Status   Specimen Description BLOOD RIGHT ANTECUBITAL  Final   Special Requests   Final    BOTTLES DRAWN AEROBIC AND ANAEROBIC Blood Culture results may not be optimal due to an inadequate volume of blood received in culture bottles   Culture NO GROWTH 5 DAYS  Final   Report Status 08/18/2017 FINAL  Final  Culture, blood (routine x 2)     Status: None   Collection Time: 08/13/17  8:59 PM  Result Value Ref Range Status   Specimen Description BLOOD LEFT UPPER ARM  Final   Special Requests   Final    BOTTLES DRAWN AEROBIC AND ANAEROBIC Blood Culture adequate volume   Culture NO GROWTH 5 DAYS  Final   Report Status 08/18/2017 FINAL  Final  MRSA PCR Screening     Status: None   Collection Time: 08/13/17 10:46 PM  Result Value Ref Range Status   MRSA by PCR NEGATIVE NEGATIVE Final    Comment:        The GeneXpert MRSA Assay (FDA approved for NASAL specimens only), is one component of a comprehensive MRSA colonization surveillance program. It is not intended to diagnose MRSA infection nor to guide or monitor treatment for MRSA infections.     Radiology Reports Mr Thoracic Spine W Wo Contrast  Result Date: 08/13/2017 CLINICAL DATA:  Back pain, infection suspected. No disc osteomyelitis with progressive pain. EXAM: MRI THORACIC WITHOUT AND WITH CONTRAST TECHNIQUE: Multiplanar and multiecho pulse sequences of the thoracic spine were obtained without and with intravenous contrast. CONTRAST:  <See Chart> MULTIHANCE GADOBENATE DIMEGLUMINE 529  MG/ML IV SOLN COMPARISON:  MRI of the thoracic spine 07/07/2017. FINDINGS: MRI THORACIC SPINE FINDINGS Alignment: Further exaggerated kyphosis is present following near complete collapse of the T5 vertebral body. Vertebrae: There is further collapse of the T5 vertebral body in the superior endplate T6. Significant retropulsed bone extends 10 mm into the spinal canal at T5-6. Cord: Focal T2 hyperintensity is present within the spinal cord at the T5-6 level. Paraspinal and other soft tissues: Progressive enhancing soft tissue is present within the paraspinal space at T5 and T6. Enhancing soft tissue extends to the inferior aspect of T4 without abnormal signal or enhancement in the T4-5 disc space. A paraspinal lipoma is present from the C7 through T2 on the left. Disc levels: The disc levels at L3-4 above are normal. T4-5:  Negative. T5-6: Severe central canal stenosis is present with distortion of the cord. Severe foraminal stenosis is now present bilaterally. No fluid or fat can be seen within either foramen. Enhancing epidural tissue extends superiorly to T4 and inferiorly to the T6-7 disc level. T6-7:  Negative. No significant disc protrusion or stenosis is present within the lower thoracic spine. IMPRESSION: 1. 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 2. Retropulsed bone and disc material results in severe central canal stenosis at T5-6 with T2 signal abnormality in the cord compatible with edema. 3. Severe foraminal stenosis bilaterally at C5-6. 4. Progressive paraspinal and epidural enhancing soft tissue compatible with epidural abscess ventral to the cord at the T5-6 level. 5. Paraspinal and epidural enhancement extends up to the T4 level without definite enhancement in the T4 vertebral body or changes at the T4-5 disc. These results were called by telephone at the time of interpretation on 08/13/2017 at 5:17 pm to Dr. Regenia Skeeter, who verbally acknowledged these results. Electronically  Signed   By: San Morelle M.D.   On: 08/13/2017  17:19    Lab Data:  CBC: Recent Labs  Lab 08/13/17 1344 08/14/17 0521 08/15/17 0404 08/16/17 0311  WBC 12.4* 12.1* 11.3* 10.2  NEUTROABS 10.6*  --   --   --   HGB 14.6 13.9 13.1 12.6*  HCT 42.9 40.4 38.3* 37.4*  MCV 82.2 79.5 79.5 80.8  PLT 451* 443* 412* 081*   Basic Metabolic Panel: Recent Labs  Lab 08/13/17 1344 08/14/17 0521 08/15/17 0404 08/16/17 0311  NA 132* 133* 135 137  K 5.3* 3.0* 3.5 4.2  CL 95* 96* 105 107  CO2 26 21* 21* 23  GLUCOSE 94 109* 112* 102*  BUN 13 12 20  25*  CREATININE 0.78 0.75 0.85 0.76  CALCIUM 9.2 9.1 9.0 8.8*  MG  --   --  1.6*  --    GFR: Estimated Creatinine Clearance: 70.8 mL/min (by C-G formula based on SCr of 0.76 mg/dL). Liver Function Tests: Recent Labs  Lab 08/13/17 1344 08/14/17 0521 08/15/17 0404  AST 49* 19 14*  ALT 23 14* 12*  ALKPHOS 89 81 77  BILITOT 2.4* 1.5* 1.5*  PROT 7.7 7.2 6.4*  ALBUMIN 4.0 3.6 3.3*   No results for input(s): LIPASE, AMYLASE in the last 168 hours. No results for input(s): AMMONIA in the last 168 hours. Coagulation Profile: No results for input(s): INR, PROTIME in the last 168 hours. Cardiac Enzymes: No results for input(s): CKTOTAL, CKMB, CKMBINDEX, TROPONINI in the last 168 hours. BNP (last 3 results) No results for input(s): PROBNP in the last 8760 hours. HbA1C: No results for input(s): HGBA1C in the last 72 hours. CBG: No results for input(s): GLUCAP in the last 168 hours. Lipid Profile: No results for input(s): CHOL, HDL, LDLCALC, TRIG, CHOLHDL, LDLDIRECT in the last 72 hours. Thyroid Function Tests: No results for input(s): TSH, T4TOTAL, FREET4, T3FREE, THYROIDAB in the last 72 hours. Anemia Panel: No results for input(s): VITAMINB12, FOLATE, FERRITIN, TIBC, IRON, RETICCTPCT in the last 72 hours. Urine analysis:    Component Value Date/Time   COLORURINE YELLOW 08/13/2017 1547   APPEARANCEUR CLEAR 08/13/2017 1547    LABSPEC 1.018 08/13/2017 1547   PHURINE 7.0 08/13/2017 1547   GLUCOSEU NEGATIVE 08/13/2017 1547   HGBUR NEGATIVE 08/13/2017 1547   BILIRUBINUR NEGATIVE 08/13/2017 1547   KETONESUR NEGATIVE 08/13/2017 1547   PROTEINUR NEGATIVE 08/13/2017 1547   UROBILINOGEN 1.0 08/09/2014 1519   NITRITE NEGATIVE 08/13/2017 1547   LEUKOCYTESUR NEGATIVE 08/13/2017 1547     Ripudeep Rai M.D. [facility] 08/18/2017, 4:52 PM  Pager: [phone] Between 7am to 7pm - call Pager - [phone]  After 7pm go to [email or web address] - password [email or web address]  Call night coverage person covering after 7pm

## 2017-08-18 NOTE — Progress Notes (Signed)
Received permission from Patient to search his personal belongings to ensure there was not any derogatory paraphernalia that would inhibit his acceptance into a rehab facility.  Patient did not have any medications, drugs, nor any derogatory paraphernalia of concern.  This search was witnessed with the SW Intern Massie Kluver and with the patient present.   Sheliah Plane RN

## 2017-08-18 NOTE — Progress Notes (Signed)
Physical Therapy Treatment Patient Details Name: Alias Villagran MRN: [id number] DOB: 1951/02/23 Today's Date: 08/18/2017    History of Present Illness Pt is a 66 y/o male admitted secondary to worsening back pain. Pt found to have an epidural abscess, T5-T6 osteomyelitis and T5-T6 spondylodiscitis. PMH including but not limited to polysubstance abuse, COPD and Hep C.    PT Comments    Pt initially refusing secondary to pain; however, stated that he needed to use the bathroom. Pt able to ambulate to and from bathroom with supervision without use of an AD. Pt would continue to benefit from skilled physical therapy services at this time while admitted and after d/c to address the below listed limitations in order to improve overall safety and independence with functional mobility.    Follow Up Recommendations  SNF;Supervision/Assistance - 24 hour     Equipment Recommendations  None recommended by PT    Recommendations for Other Services       Precautions / Restrictions Precautions Precautions: Fall;Back Precaution Comments: pt very focused on pain and declined OOB bed activity. Pt agreeable to sit EOB Required Braces or Orthoses: Spinal Brace Spinal Brace: Other (comment)(hyperextension brace) Spinal Brace Comments: pt unable to tolerate spinal brace and refuses to wear Restrictions Weight Bearing Restrictions: No    Mobility  Bed Mobility Overal bed mobility: Modified Independent             General bed mobility comments: increased time and effort, limited secondary to pain  Transfers Overall transfer level: Modified independent Equipment used: None             General transfer comment: pt refused sit - stand, transfer and any OOB acitivity  Ambulation/Gait Ambulation/Gait assistance: Supervision Ambulation Distance (Feet): 20 Feet(20' x2 with sitting rest break to void) Assistive device: None Gait Pattern/deviations: Step-through pattern;Decreased stride  length;Shuffle Gait velocity: decreased Gait velocity interpretation: Below normal speed for age/gender General Gait Details: limited secondary to pain; slow, steady, shuffling gait with no instability or need for physical assistance; supervision for safety   Stairs            Wheelchair Mobility    Modified Rankin (Stroke Patients Only)       Balance Overall balance assessment: Needs assistance Sitting-balance support: Feet supported Sitting balance-Leahy Scale: Fair     Standing balance support: No upper extremity supported Standing balance-Leahy Scale: Fair                              Cognition Arousal/Alertness: Awake/alert Behavior During Therapy: WFL for tasks assessed/performed Overall Cognitive Status: Impaired/Different from baseline Area of Impairment: Memory                     Memory: Decreased recall of precautions       Problem Solving: Requires verbal cues        Exercises      General Comments        Pertinent Vitals/Pain Pain Assessment: Faces Pain Score: 8  Faces Pain Scale: Hurts little more Pain Location: back Pain Descriptors / Indicators: Sore;Guarding Pain Intervention(s): Monitored during session;Repositioned    Home Living                      Prior Function            PT Goals (current goals can now be found in the care plan section) Acute Rehab PT Goals PT  Goal Formulation: With patient Time For Goal Achievement: 08/30/17 Potential to Achieve Goals: Good Progress towards PT goals: Progressing toward goals    Frequency    Min 2X/week      PT Plan Current plan remains appropriate    Co-evaluation              AM-PAC PT "6 Clicks" Daily Activity  Outcome Measure  Difficulty turning over in bed (including adjusting bedclothes, sheets and blankets)?: None Difficulty moving from lying on back to sitting on the side of the bed? : None Difficulty sitting down on and  standing up from a chair with arms (e.g., wheelchair, bedside commode, etc,.)?: None Help needed moving to and from a bed to chair (including a wheelchair)?: None Help needed walking in hospital room?: None Help needed climbing 3-5 steps with a railing? : A Little 6 Click Score: 23    End of Session   Activity Tolerance: Patient limited by pain Patient left: in bed;with call bell/phone within reach Nurse Communication: Mobility status PT Visit Diagnosis: Other abnormalities of gait and mobility (R26.89);Pain Pain - part of body: (back)     Time: 3568-6168 PT Time Calculation (min) (ACUTE ONLY): 10 min  Charges:  $Gait Training: 8-22 mins                    G Codes:       [place], [place], [place] 08/18/2017, 2:31 PM

## 2017-08-18 NOTE — Progress Notes (Signed)
Occupational Therapy Treatment Patient Details Name: Sinjin Amero MRN: [id number] DOB: 07-08-1951 Today's Date: 08/18/2017    History of present illness Pt is a 66 y/o male admitted secondary to worsening back pain. Pt found to have an epidural abscess, T5-T6 osteomyelitis and T5-T6 spondylodiscitis. PMH including but not limited to polysubstance abuse, COPD and Hep C.   OT comments  Pt limited by back pain and refused any OOB activity. Pt educated on ADL A/E and back precautions with handout provided. Pt agreeable to sit EOB during education and to drink OJ. OT will continue to follow acutely  Follow Up Recommendations  SNF    Equipment Recommendations       Recommendations for Other Services      Precautions / Restrictions Precautions Precautions: Fall;Back Precaution Comments: pt very focused on pain and declined OOB bed activity. Pt agreeable to sit EOB Required Braces or Orthoses: Spinal Brace Spinal Brace Comments: pt unable to tolerate spinal brace and refuses to wear Restrictions Weight Bearing Restrictions: No       Mobility Bed Mobility Overal bed mobility: Modified Independent             General bed mobility comments: increased time and effort, limited secondary to pain  Transfers                 General transfer comment: pt refused sit - stand, transfer and any OOB acitivity    Balance Overall balance assessment: Needs assistance Sitting-balance support: Feet supported Sitting balance-Leahy Scale: Fair                                     ADL either performed or assessed with clinical judgement   ADL                                         General ADL Comments: pt refused ADLs, agreeable to A/E education, pt sat EOB x 5 minutes to drink OJ and for part of back education (handout provided)     Vision Patient Visual Report: No change from baseline     Perception     Praxis      Cognition  Arousal/Alertness: Awake/alert Behavior During Therapy: WFL for tasks assessed/performed Overall Cognitive Status: Impaired/Different from baseline                       Memory: Decreased recall of precautions       Problem Solving: Requires verbal cues          Exercises     Shoulder Instructions       General Comments      Pertinent Vitals/ Pain       Pain Assessment: 0-10 Pain Score: 8  Pain Location: back Pain Descriptors / Indicators: Sore;Guarding Pain Intervention(s): Limited activity within patient's tolerance;Monitored during session;Repositioned;Premedicated before session  Home Living                                          Prior Functioning/Environment              Frequency  Min 2X/week        Progress Toward Goals  OT Goals(current goals can now be  found in the care plan section)  Progress towards OT goals: OT to reassess next treatment     Plan Discharge plan remains appropriate    Co-evaluation                 AM-PAC PT "6 Clicks" Daily Activity     Outcome Measure   Help from another person eating meals?: None Help from another person taking care of personal grooming?: A Little Help from another person toileting, which includes using toliet, bedpan, or urinal?: A Little Help from another person bathing (including washing, rinsing, drying)?: A Little Help from another person to put on and taking off regular upper body clothing?: A Lot Help from another person to put on and taking off regular lower body clothing?: A Little 6 Click Score: 18    End of Session Equipment Utilized During Treatment: Back brace  OT Visit Diagnosis: Unsteadiness on feet (R26.81);Other abnormalities of gait and mobility (R26.89);Muscle weakness (generalized) (M62.81);Pain;Adult, failure to thrive (R62.7) Pain - part of body: (back)   Activity Tolerance Patient limited by pain   Patient Left in bed;with call bell/phone  within reach;with bed alarm set;with nursing/sitter in room   Nurse Communication      Functional Assessment Tool Used: AM-PAC 6 Clicks Daily Activity   Time: 9024-0973 OT Time Calculation (min): 17 min  Charges: OT G-codes **NOT FOR INPATIENT CLASS** Functional Assessment Tool Used: AM-PAC 6 Clicks Daily Activity OT General Charges $OT Visit: 1 Visit OT Treatments $Therapeutic Activity: 8-22 mins    Britt Bottom 08/18/2017, 1:38 PM

## 2017-08-19 MED ORDER — ENSURE ENLIVE PO LIQD: 237.0000 mL | Freq: Two times a day (BID) | ORAL | 12 refills | Status: AC

## 2017-08-19 MED ORDER — LIDOCAINE 5 % EX PTCH: 1.0000 | MEDICATED_PATCH | CUTANEOUS | 1 refills | Status: AC

## 2017-08-19 MED ORDER — OXYCODONE HCL 5 MG PO TABS: 5.0000 mg | ORAL_TABLET | ORAL | 0 refills | Status: AC | PRN

## 2017-08-19 MED ORDER — FLUOXETINE HCL 40 MG PO CAPS: 40.0000 mg | ORAL_CAPSULE | Freq: Every day | ORAL | 0 refills | Status: AC

## 2017-08-19 MED ORDER — GABAPENTIN 300 MG PO CAPS: 300.0000 mg | ORAL_CAPSULE | Freq: Three times a day (TID) | ORAL | 2 refills | Status: AC

## 2017-08-19 MED ORDER — CIPROFLOXACIN HCL 750 MG PO TABS: 750.0000 mg | ORAL_TABLET | Freq: Two times a day (BID) | ORAL | 0 refills | Status: AC

## 2017-08-19 NOTE — Progress Notes (Signed)
Pt states home phone is cell phone number.

## 2017-08-19 NOTE — Progress Notes (Signed)
Paged Dr. Tana Coast, SW called pt was to go to [facility], but will be discharged to daughters home.  Does Pt need HH order.  If so need HH order before discharge.  SW. [phone]

## 2017-08-19 NOTE — Care Management Note (Addendum)
Case Management Note  Patient Details  Name: Gregory Price MRN: [id number] Date of Birth: August 15, 1951  Subjective/Objective:   Pt admitted with back pain                 Action/Plan:  PTA pt was homeless.  Discharge plan until 11/9 - was for pt to discharge to SNF however CSW unable to secure bed for pt and therefore pt will discharge to his daughters home in [place].   Pt confirms that he will discharge to daughters home. Pt states he has not seen a doctor at the [facility] but plans on reestablishing care at the [place] in [place] post discharge.  Pt declined [place] and DME at this time, per bedside nurse pt walks independently in the room.  CM provided free clinic information for [facility] and [facility] on [facility].  CM unable to provide medication assistance due to active insurance.  No CM needs determined, discharge order has been written, CM signing off   Expected Discharge Date:  08/19/17               Expected Discharge Plan:  Home/Self Care  In-House Referral:  Clinical Social Work  Discharge planning Services  CM Consult, [facility]  Post Acute Care Choice:    Choice offered to:     DME Arranged:    DME Agency:     HH Arranged:    [place] Agency:     Status of Service:  Completed, signed off  If discussed at [employer], dates discussed:    Additional Comments:  Maryclare Labrador, RN 08/19/2017, 8:52 AM

## 2017-08-19 NOTE — Progress Notes (Signed)
Pt unable to get in contact with daughter for d/c transportation. Advised MD.

## 2017-08-19 NOTE — Progress Notes (Signed)
Called pts daughter home mailbox full.

## 2017-08-20 NOTE — Progress Notes (Signed)
[facility]                                                                              Patient Demographics  Gregory Price, is a 66 y.o. male, DOB - [birth year], XFG:[id number]  Admit date - 08/13/2017   Admitting Physician [name], MD  Outpatient Primary MD for the patient is System, Provider Not In  Outpatient specialists:   LOS - 7  days   Medical records reviewed and are as summarized below:    Chief Complaint  Patient presents with  . Back Pain       Brief summary   65yomalew/nicotine dep, COPD, IV heroin use, Hepatitis C,and anadmission on 9/28 for T5 compression fracture concerning for discitis/osteomyelitis.Blood cultures 9/28 revealed Serratia marcensandID recommended 6 weeks of abx. He was discharged on cipro, but he had not been taking it. He returned to the ED c/o increase in thoracic spine pain for 1 week. In the ED anMRInoted progressive collapse of T5 and T6 associated disc osteomyelitis, retropulsed bone and disc material resultingin severe central canal stenosis at T5-6 with T2 signal abnormality in the cord compatible with edeme, severe foraminal stenosis bilaterally at C5-6, progressive paraspinal and epidural enhancing soft tissue compatible with epidural abscess ventral to the cord at the T5-6 level, and paraspinal and epidural enhancement extends up to the T4 level without definite enhancement in the T4 vertebral body or changes at the T4-5 disc.UDS opioid positive, coccaine positive  Assessment & Plan    Principal Problem:   Epidural abscess, 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  -Neurosurgery was consulted, patient is not a surgical candidate due to noncompliance -Recommended jewett hyperextension brace, should be worn when patient is out of bed - PT recommended skilled nursing facility - Continue pain control, bowel regimen - 2D echo showed EF of 96-78%, grade 1 diastolic dysfunction, no  vegetation - Infectious disease was consulted, recommended to stop vancomycin and ceftriaxone, placed on ciprofloxacin 750 mg twice a day for 6 weeks - added lidoderm patch for pain, cont current regimen  -Per patient was not able to get in touch with his daughter yesterday for transportation.  Hence discharge was held.  Patient trying to get in touch with his daughter today.  Active Problems:    COPD (chronic obstructive pulmonary disease) (HCC) -Currently stable, no wheezing     Hyponatremia -Currently stable and improving     Depression due to physical illness - Psychiatry was consulted due to suicidal ideation, not a candidate for inpatient psychiatry  - continue Prozac, increased to 40mg  daily, gabapentin increased to 300 mg TID    Polysubstance dependence including opioid (HCC)/IV drug use - Currently complaining of significant pain due to compression fracture, hence placed on pain medications, toradol, oxycodone PRN.  -  Suboxone on hold until patient has improved and he can resume Suboxone at that time.  Or patient can be placed on methadone taper outpatient. - added lidoderm patch    Code Status: Full code DVT Prophylaxis:  Lovenox  Family Communication: Discussed in detail with the patient, all imaging results, lab results explained to the patient  Disposition Plan: Patient trying to get in touch with his daughter today for ride.  No new changes, no acute issues overnight.  Stable for discharge today.  Time Spent in minutes 15 minutes  Procedures:  None  Consultants:   Neurosurgery Infectious disease Psychiatry  Antimicrobials:      Medications  Scheduled Meds: . acetaminophen  1,000 mg Oral Q8H  . ciprofloxacin  750 mg Oral BID  . enoxaparin (LOVENOX) injection  40 mg Subcutaneous Q24H  . feeding supplement (ENSURE ENLIVE)  237 mL Oral BID BM  . FLUoxetine  40 mg Oral Daily  . folic acid  1 mg Oral Daily  . gabapentin  300 mg Oral TID  . lidocaine  1  patch Transdermal Q24H  . nicotine  21 mg Transdermal Daily  . senna-docusate  1 tablet Oral QHS  . thiamine  100 mg Oral Daily   Continuous Infusions:  PRN Meds:.acetaminophen **OR** acetaminophen, hydrALAZINE, HYDROmorphone (DILAUDID) injection, ibuprofen, oxyCODONE, polyethylene glycol   Antibiotics   Anti-infectives (From admission, onward)   Start     Dose/Rate Route Frequency Ordered Stop   08/19/17 0000  ciprofloxacin (CIPRO) 750 MG tablet     750 mg Oral 2 times daily 08/19/17 0750 09/18/17 2359   08/17/17 0000  ciprofloxacin (CIPRO) 750 MG tablet  Status:  Discontinued     750 mg Oral 2 times daily 08/17/17 1257 08/19/17    08/14/17 1100  ciprofloxacin (CIPRO) tablet 750 mg     750 mg Oral 2 times daily 08/14/17 0954     08/13/17 2200  ceFEPIme (MAXIPIME) 2 g in dextrose 5 % 50 mL IVPB  Status:  Discontinued     2 g 100 mL/hr over 30 Minutes Intravenous Every 8 hours 08/13/17 2101 08/14/17 0954   08/13/17 1745  cefTRIAXone (ROCEPHIN) 2 g in dextrose 5 % 50 mL IVPB     2 g 100 mL/hr over 30 Minutes Intravenous  Once 08/13/17 1732 08/13/17 2117   08/13/17 1745  vancomycin (VANCOCIN) IVPB 750 mg/150 ml premix     750 mg 150 mL/hr over 60 Minutes Intravenous  Once 08/13/17 1732 08/13/17 2216        Subjective:   Gregory Price was seen and examined today.  Feels a lot better today, waiting for ride.    Patient denies dizziness, chest pain, shortness of breath, N/V. No acute events overnight.    Objective:   Vitals:   08/19/17 0752 08/19/17 2211 08/20/17 0504 08/20/17 0836  BP: (!) 156/88 (!) 148/74 117/70 (!) 150/79  Pulse: 68 74 79 73  Resp: 17 16 16 18   Temp: 98.5 F (36.9 C) 99.1 F (37.3 C) 98.5 F (36.9 C) 98.6 F (37 C)  TempSrc: Oral Oral Oral Oral  SpO2: 100% 99% 100% 100%  Weight:  51.6 kg (113 lb 12.1 oz)    Height:        Intake/Output Summary (Last 24 hours) at 08/20/2017 1009 Last data filed at 08/20/2017 0836 Gross per 24 hour  Intake 480 ml   Output 0 ml  Net 480 ml     Wt Readings from Last 3 Encounters:  08/19/17 51.6 kg (113 lb 12.1 oz)  07/07/17 63.5 kg (140 lb)  01/07/15 65.8 kg (145 lb)     Exam   General: Alert and oriented x 3, NAD  Eyes:   HEENT:   Cardiovascular: S1 S2 clear, RRR No pedal edema b/l  Respiratory: CTAB  Gastrointestinal: Soft, nontender, nondistended, + bowel  sounds  Ext: no pedal edema bilaterally  Neuro: no neuro deficits  Musculoskeletal: No digital cyanosis, clubbing  Skin: No rashes  Psych: Normal affect and demeanor, alert and oriented x3      Data Reviewed:  I have personally reviewed following labs and imaging studies  Micro Results Recent Results (from the past 240 hour(s))  Culture, blood (routine x 2)     Status: None   Collection Time: 08/13/17  2:03 PM  Result Value Ref Range Status   Specimen Description BLOOD RIGHT ANTECUBITAL  Final   Special Requests   Final    BOTTLES DRAWN AEROBIC AND ANAEROBIC Blood Culture results may not be optimal due to an inadequate volume of blood received in culture bottles   Culture NO GROWTH 5 DAYS  Final   Report Status 08/18/2017 FINAL  Final  Culture, blood (routine x 2)     Status: None   Collection Time: 08/13/17  8:59 PM  Result Value Ref Range Status   Specimen Description BLOOD LEFT UPPER ARM  Final   Special Requests   Final    BOTTLES DRAWN AEROBIC AND ANAEROBIC Blood Culture adequate volume   Culture NO GROWTH 5 DAYS  Final   Report Status 08/18/2017 FINAL  Final  MRSA PCR Screening     Status: None   Collection Time: 08/13/17 10:46 PM  Result Value Ref Range Status   MRSA by PCR NEGATIVE NEGATIVE Final    Comment:        The GeneXpert MRSA Assay (FDA approved for NASAL specimens only), is one component of a comprehensive MRSA colonization surveillance program. It is not intended to diagnose MRSA infection nor to guide or monitor treatment for MRSA infections.     Radiology Reports Mr Thoracic  Spine W Wo Contrast  Result Date: 08/13/2017 CLINICAL DATA:  Back pain, infection suspected. No disc osteomyelitis with progressive pain. EXAM: MRI THORACIC WITHOUT AND WITH CONTRAST TECHNIQUE: Multiplanar and multiecho pulse sequences of the thoracic spine were obtained without and with intravenous contrast. CONTRAST:  <See Chart> MULTIHANCE GADOBENATE DIMEGLUMINE 529 MG/ML IV SOLN COMPARISON:  MRI of the thoracic spine 07/07/2017. FINDINGS: MRI THORACIC SPINE FINDINGS Alignment: Further exaggerated kyphosis is present following near complete collapse of the T5 vertebral body. Vertebrae: There is further collapse of the T5 vertebral body in the superior endplate T6. Significant retropulsed bone extends 10 mm into the spinal canal at T5-6. Cord: Focal T2 hyperintensity is present within the spinal cord at the T5-6 level. Paraspinal and other soft tissues: Progressive enhancing soft tissue is present within the paraspinal space at T5 and T6. Enhancing soft tissue extends to the inferior aspect of T4 without abnormal signal or enhancement in the T4-5 disc space. A paraspinal lipoma is present from the C7 through T2 on the left. Disc levels: The disc levels at L3-4 above are normal. T4-5:  Negative. T5-6: Severe central canal stenosis is present with distortion of the cord. Severe foraminal stenosis is now present bilaterally. No fluid or fat can be seen within either foramen. Enhancing epidural tissue extends superiorly to T4 and inferiorly to the T6-7 disc level. T6-7:  Negative. No significant disc protrusion or stenosis is present within the lower thoracic spine. IMPRESSION: 1. 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 2. Retropulsed bone and disc material results in severe central canal stenosis at T5-6 with T2 signal abnormality in the cord compatible with edema. 3. Severe foraminal stenosis bilaterally at C5-6. 4. Progressive paraspinal and epidural  enhancing soft tissue compatible with  epidural abscess ventral to the cord at the T5-6 level. 5. Paraspinal and epidural enhancement extends up to the T4 level without definite enhancement in the T4 vertebral body or changes at the T4-5 disc. These results were called by telephone at the time of interpretation on 08/13/2017 at 5:17 pm to Dr. Regenia Skeeter, who verbally acknowledged these results. Electronically Signed   By: San Morelle M.D.   On: 08/13/2017 17:19    Lab Data:  CBC: Recent Labs  Lab 08/13/17 1344 08/14/17 0521 08/15/17 0404 08/16/17 0311  WBC 12.4* 12.1* 11.3* 10.2  NEUTROABS 10.6*  --   --   --   HGB 14.6 13.9 13.1 12.6*  HCT 42.9 40.4 38.3* 37.4*  MCV 82.2 79.5 79.5 80.8  PLT 451* 443* 412* 466*   Basic Metabolic Panel: Recent Labs  Lab 08/13/17 1344 08/14/17 0521 08/15/17 0404 08/16/17 0311  NA 132* 133* 135 137  K 5.3* 3.0* 3.5 4.2  CL 95* 96* 105 107  CO2 26 21* 21* 23  GLUCOSE 94 109* 112* 102*  BUN 13 12 20  25*  CREATININE 0.78 0.75 0.85 0.76  CALCIUM 9.2 9.1 9.0 8.8*  MG  --   --  1.6*  --    GFR: Estimated Creatinine Clearance: 67.2 mL/min (by C-G formula based on SCr of 0.76 mg/dL). Liver Function Tests: Recent Labs  Lab 08/13/17 1344 08/14/17 0521 08/15/17 0404  AST 49* 19 14*  ALT 23 14* 12*  ALKPHOS 89 81 77  BILITOT 2.4* 1.5* 1.5*  PROT 7.7 7.2 6.4*  ALBUMIN 4.0 3.6 3.3*   No results for input(s): LIPASE, AMYLASE in the last 168 hours. No results for input(s): AMMONIA in the last 168 hours. Coagulation Profile: No results for input(s): INR, PROTIME in the last 168 hours. Cardiac Enzymes: No results for input(s): CKTOTAL, CKMB, CKMBINDEX, TROPONINI in the last 168 hours. BNP (last 3 results) No results for input(s): PROBNP in the last 8760 hours. HbA1C: No results for input(s): HGBA1C in the last 72 hours. CBG: No results for input(s): GLUCAP in the last 168 hours. Lipid Profile: No results for input(s): CHOL, HDL, LDLCALC, TRIG, CHOLHDL, LDLDIRECT in the  last 72 hours. Thyroid Function Tests: No results for input(s): TSH, T4TOTAL, FREET4, T3FREE, THYROIDAB in the last 72 hours. Anemia Panel: No results for input(s): VITAMINB12, FOLATE, FERRITIN, TIBC, IRON, RETICCTPCT in the last 72 hours. Urine analysis:    Component Value Date/Time   COLORURINE YELLOW 08/13/2017 1547   APPEARANCEUR CLEAR 08/13/2017 1547   LABSPEC 1.018 08/13/2017 1547   PHURINE 7.0 08/13/2017 1547   GLUCOSEU NEGATIVE 08/13/2017 1547   HGBUR NEGATIVE 08/13/2017 1547   BILIRUBINUR NEGATIVE 08/13/2017 1547   KETONESUR NEGATIVE 08/13/2017 1547   PROTEINUR NEGATIVE 08/13/2017 1547   UROBILINOGEN 1.0 08/09/2014 1519   NITRITE NEGATIVE 08/13/2017 1547   LEUKOCYTESUR NEGATIVE 08/13/2017 1547     Cristabel Bicknell M.D. [facility] 08/20/2017, 10:09 AM  Pager: [phone] Between 7am to 7pm - call Pager - [phone]  After 7pm go to [email or web address] - password [email or web address]  Call night coverage person covering after 7pm

## 2017-08-20 NOTE — Progress Notes (Signed)
Discharge instructions and medications/prescriptions discussed and reviewed with pt, verbalized understanding. Pt was escorted to the lounging/visitor's area in the unit while waiting for his daughter to pick him up. Daughter made aware this AM that his father was discharged and that she needs to pick him up, she verbalized understanding and stated she will be here after her work.

## 2017-08-29 ENCOUNTER — Inpatient Hospital Stay: Payer: Self-pay | Admitting: Infectious Diseases

## 2019-03-08 IMAGING — MR MR THORACIC SPINE WO/W CM
4 of 9 series · 18 of 48 positions shown · IV contrast (multihance)
Comparison: MRI of the thoracic spine 07/07/2017.

CLINICAL DATA: Back pain, infection suspected. No disc
osteomyelitis with progressive pain.

EXAM:
MRI THORACIC WITHOUT AND WITH CONTRAST
TECHNIQUE: Multiplanar and multiecho pulse sequences of the thoracic spine were
obtained without and with intravenous contrast.
CONTRAST:  <See Chart> MULTIHANCE GADOBENATE DIMEGLUMINE 529 MG/ML
IV SOLN

[Series 4: T2 · sagittal · 3.0mm · 0.62mm/px · 4 of 15 slices shown (1 of 2)]
[im 1/15]
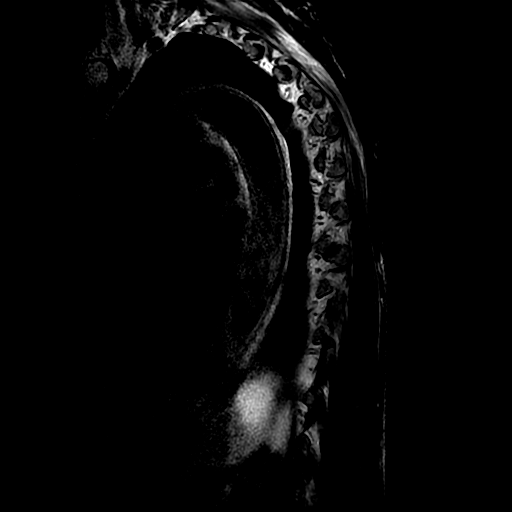
[im 5/15]
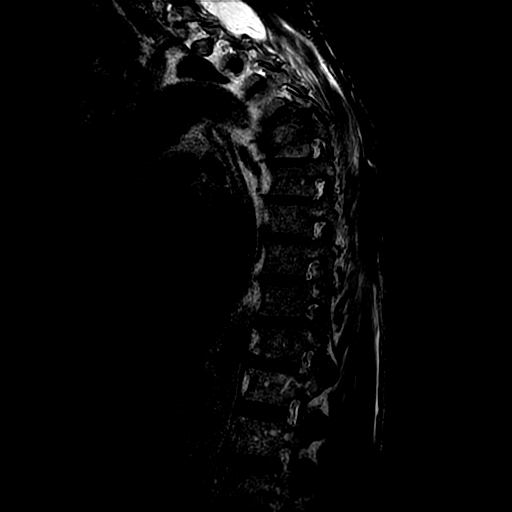
[im 10/15]
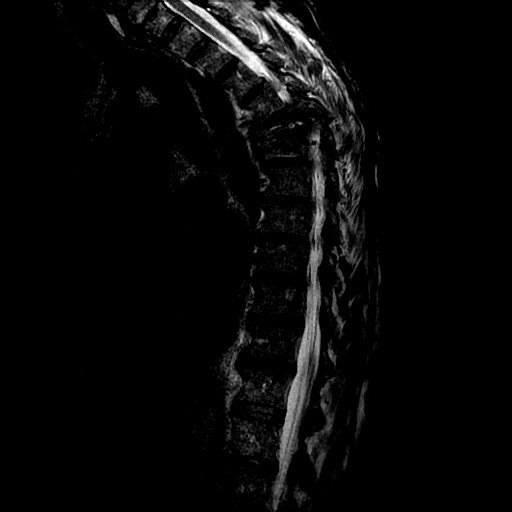
[im 15/15]
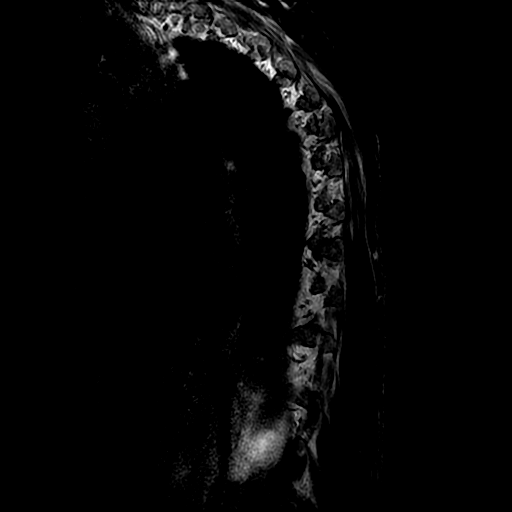

[Series 6: T1 · sagittal · 3.0mm · 0.62mm/px · 3 of 15 slices shown (1 of 2)]
[im 1/15]
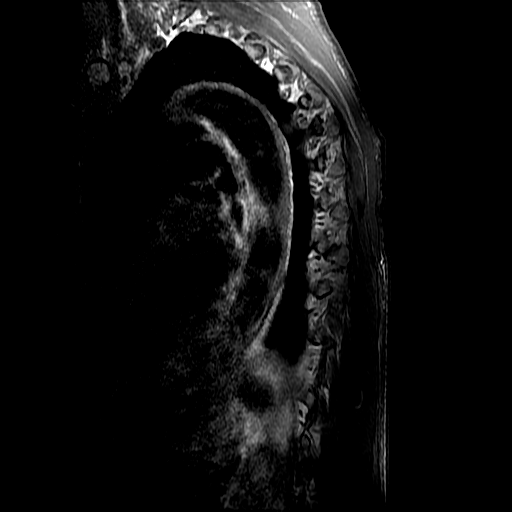
[im 8/15]
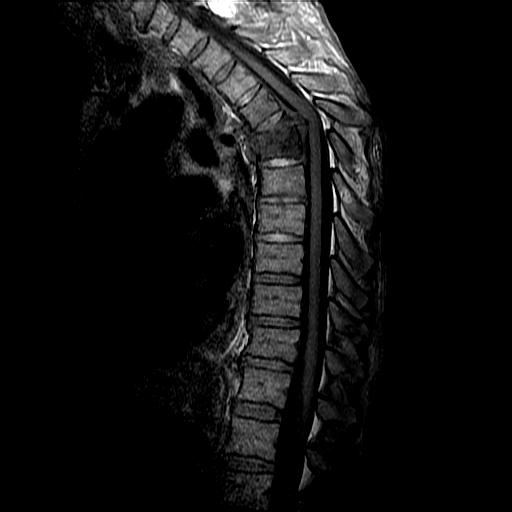
[im 15/15]
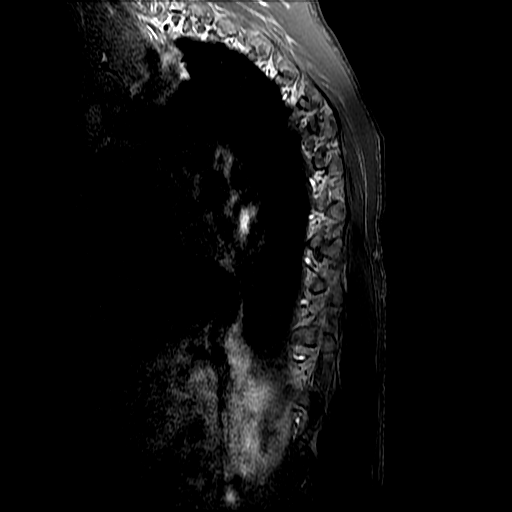

[Series 7: T2 · axial · 4.0mm · 0.43mm/px · z∈[-241,-83]mm · 8 of 37 slices shown (2 of 2)]
[im 1/37]
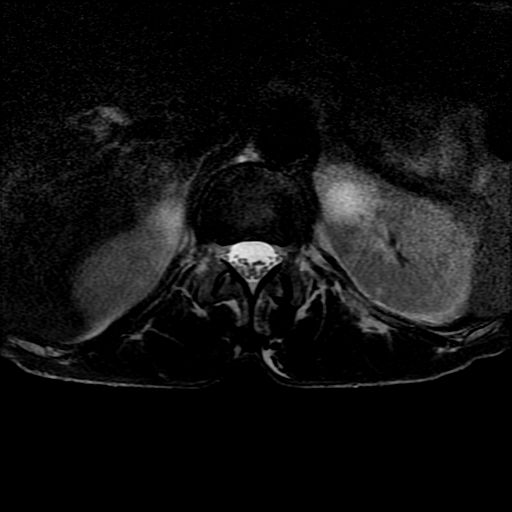
[im 6/37]
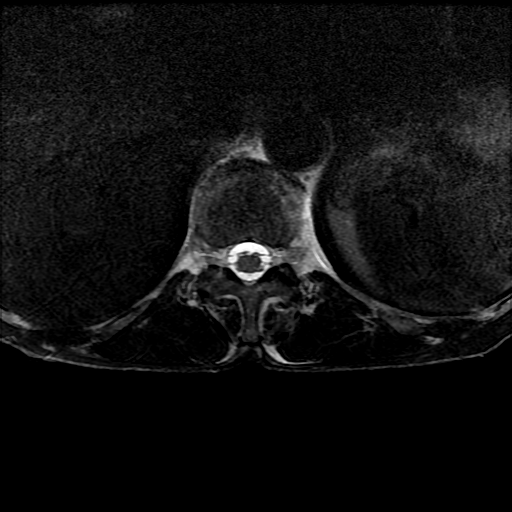
[im 11/37]
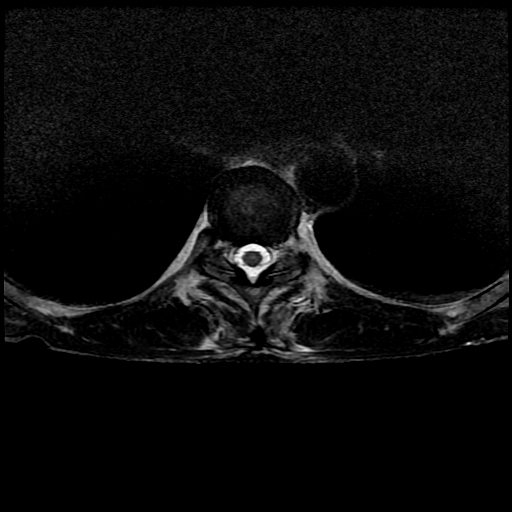
[im 16/37]
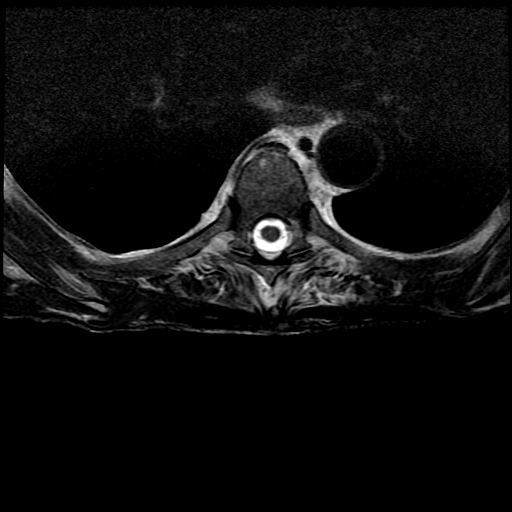
[im 21/37]
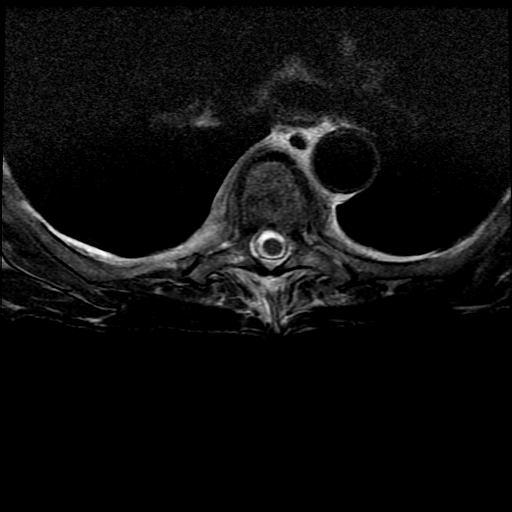
[im 26/37]
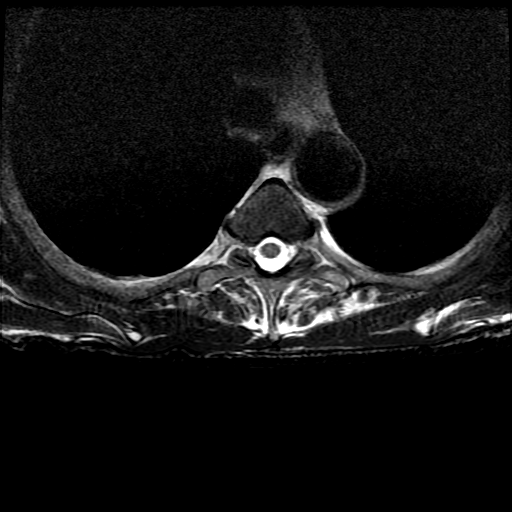
[im 31/37]
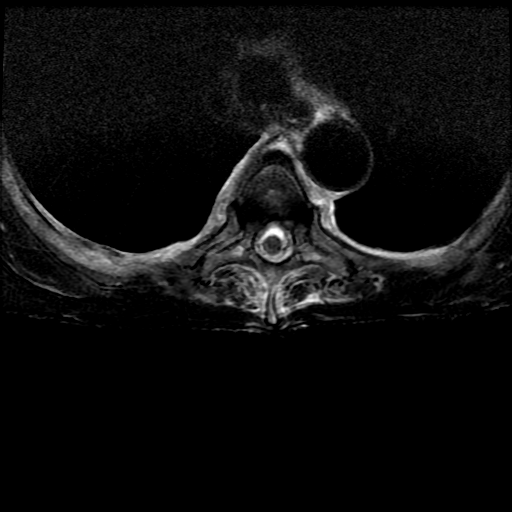
[im 37/37]
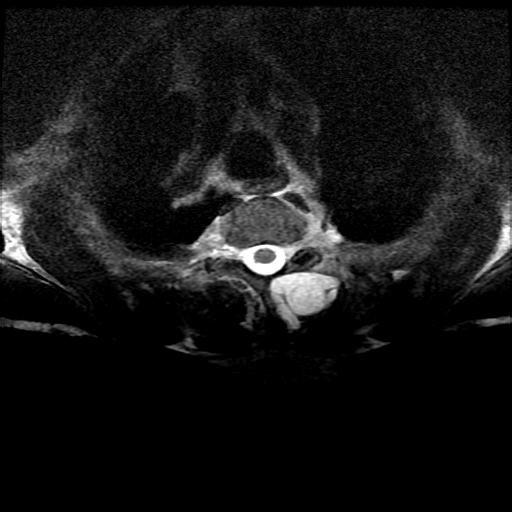

[Series 9: T1 · axial · non-contrast · 4.0mm · 0.43mm/px · z∈[-202,-96]mm · 3 of 37 slices shown (2 of 2)]
[im 6/37]
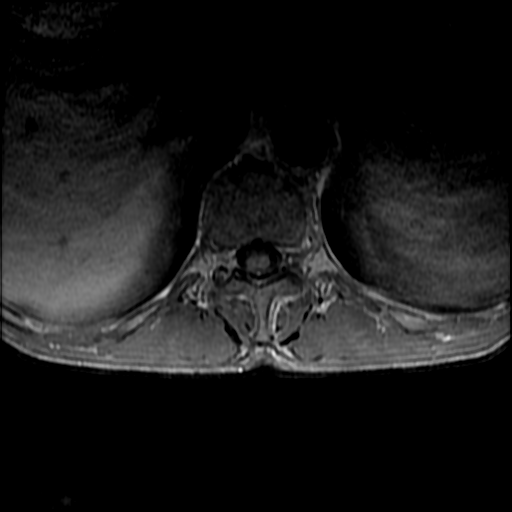
[im 21/37]
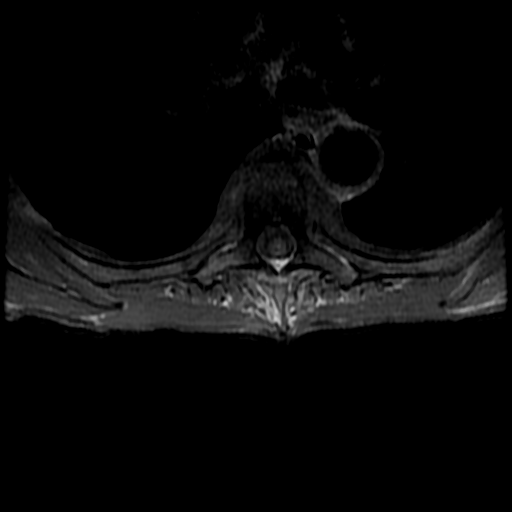
[im 31/37]
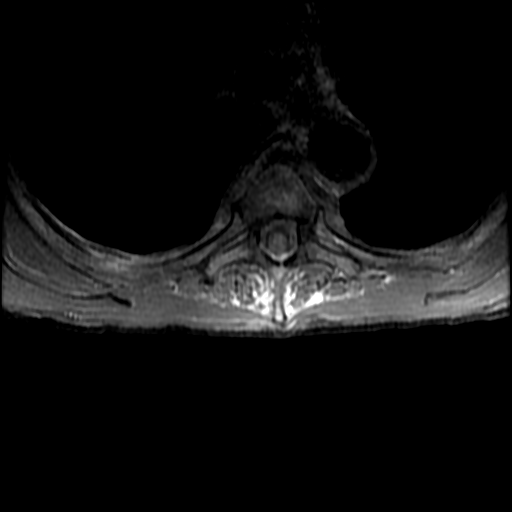

[18 of 48 positions shown; findings below may reference images not displayed]

FINDINGS: MRI THORACIC SPINE FINDINGS

Alignment: Further exaggerated kyphosis is present following near
complete collapse of the T5 vertebral body.

Vertebrae: There is further collapse of the T5 vertebral body in the
superior endplate T6. Significant retropulsed bone extends 10 mm
into the spinal canal at T5-6.

Cord: Focal T2 hyperintensity is present within the spinal cord at
the T5-6 level.

Paraspinal and other soft tissues: Progressive enhancing soft tissue
is present within the paraspinal space at T5 and T6. Enhancing soft
tissue extends to the inferior aspect of T4 without abnormal signal
or enhancement in the T4-5 disc space. A paraspinal lipoma is
present from the C7 through T2 on the left.

Disc levels:

The disc levels at L3-4 above are normal.

T4-5:  Negative.

T5-6: Severe central canal stenosis is present with distortion of
the cord. Severe foraminal stenosis is now present bilaterally. No
fluid or fat can be seen within either foramen. Enhancing epidural
tissue extends superiorly to T4 and inferiorly to the T6-7 disc
level.

T6-7:  Negative.

No significant disc protrusion or stenosis is present within the
lower thoracic spine.
IMPRESSION: 1. Progressive collapse of T5 and T6 associated disc osteomyelitis.
2. Retropulsed bone and disc material results in severe central
canal stenosis at T5-6 with T2 signal abnormality in the cord
compatible with edema.
3. Severe foraminal stenosis bilaterally at C5-6.
4. Progressive paraspinal and epidural enhancing soft tissue
compatible with epidural abscess ventral to the cord at the T5-6
level.
5. Paraspinal and epidural enhancement extends up to the T4 level
without definite enhancement in the T4 vertebral body or changes at
the T4-5 disc.
These results were called by telephone at the time of interpretation
on 08/13/2017 at [DATE] to Dr. Ciriaco, who verbally acknowledged
these results.
